# Patient Record
Sex: Male | Born: 1965 | Race: White | Hispanic: No | Marital: Single | Smoking: Current every day smoker
Health system: Southern US, Community
[De-identification: ages and names within clinical notes are randomized; demographics above are authoritative.]

## PROBLEM LIST (undated history)

## (undated) DIAGNOSIS — G4730 Sleep apnea, unspecified: Secondary | ICD-10-CM

## (undated) DIAGNOSIS — R7303 Prediabetes: Secondary | ICD-10-CM

## (undated) DIAGNOSIS — K219 Gastro-esophageal reflux disease without esophagitis: Secondary | ICD-10-CM

## (undated) DIAGNOSIS — R0600 Dyspnea, unspecified: Secondary | ICD-10-CM

## (undated) DIAGNOSIS — G709 Myoneural disorder, unspecified: Secondary | ICD-10-CM

## (undated) DIAGNOSIS — I471 Supraventricular tachycardia, unspecified: Secondary | ICD-10-CM

## (undated) DIAGNOSIS — J45909 Unspecified asthma, uncomplicated: Secondary | ICD-10-CM

## (undated) DIAGNOSIS — M1990 Unspecified osteoarthritis, unspecified site: Secondary | ICD-10-CM

## (undated) DIAGNOSIS — F419 Anxiety disorder, unspecified: Secondary | ICD-10-CM

## (undated) DIAGNOSIS — I10 Essential (primary) hypertension: Secondary | ICD-10-CM

## (undated) DIAGNOSIS — F32A Depression, unspecified: Secondary | ICD-10-CM

## (undated) HISTORY — DX: Prediabetes: R73.03

## (undated) HISTORY — DX: Essential (primary) hypertension: I10

## (undated) HISTORY — DX: Unspecified asthma, uncomplicated: J45.909

## (undated) HISTORY — DX: Sleep apnea, unspecified: G47.30

## (undated) HISTORY — DX: Anxiety disorder, unspecified: F41.9

## (undated) HISTORY — DX: Unspecified osteoarthritis, unspecified site: M19.90

## (undated) HISTORY — PX: MANDIBLE FRACTURE SURGERY: SHX706

## (undated) HISTORY — DX: Gastro-esophageal reflux disease without esophagitis: K21.9

## (undated) HISTORY — DX: Depression, unspecified: F32.A

---

## 1998-10-14 ENCOUNTER — Emergency Department (HOSPITAL_COMMUNITY): Admission: EM | Admit: 1998-10-14 | Discharge: 1998-10-14 | Payer: Self-pay | Admitting: *Deleted

## 1999-01-23 ENCOUNTER — Encounter: Payer: Self-pay | Admitting: Emergency Medicine

## 1999-01-24 ENCOUNTER — Inpatient Hospital Stay (HOSPITAL_COMMUNITY): Admission: EM | Admit: 1999-01-24 | Discharge: 1999-01-24 | Payer: Self-pay | Admitting: Emergency Medicine

## 1999-01-24 ENCOUNTER — Encounter: Payer: Self-pay | Admitting: Emergency Medicine

## 1999-01-30 ENCOUNTER — Emergency Department (HOSPITAL_COMMUNITY): Admission: EM | Admit: 1999-01-30 | Discharge: 1999-01-30 | Payer: Self-pay | Admitting: *Deleted

## 1999-02-11 ENCOUNTER — Emergency Department (HOSPITAL_COMMUNITY): Admission: EM | Admit: 1999-02-11 | Discharge: 1999-02-11 | Payer: Self-pay | Admitting: Emergency Medicine

## 1999-03-07 ENCOUNTER — Emergency Department (HOSPITAL_COMMUNITY): Admission: EM | Admit: 1999-03-07 | Discharge: 1999-03-07 | Payer: Self-pay | Admitting: Emergency Medicine

## 2006-02-17 ENCOUNTER — Emergency Department: Payer: Self-pay | Admitting: Emergency Medicine

## 2006-03-19 ENCOUNTER — Emergency Department: Payer: Self-pay | Admitting: Internal Medicine

## 2006-06-06 ENCOUNTER — Emergency Department (HOSPITAL_COMMUNITY): Admission: EM | Admit: 2006-06-06 | Discharge: 2006-06-06 | Payer: Self-pay | Admitting: Emergency Medicine

## 2007-12-05 ENCOUNTER — Emergency Department: Payer: Self-pay | Admitting: Emergency Medicine

## 2007-12-07 ENCOUNTER — Emergency Department: Payer: Self-pay | Admitting: Emergency Medicine

## 2009-09-27 ENCOUNTER — Emergency Department: Payer: Self-pay | Admitting: Emergency Medicine

## 2013-07-12 ENCOUNTER — Emergency Department: Payer: Self-pay | Admitting: Emergency Medicine

## 2013-07-12 LAB — URINALYSIS, COMPLETE
Bacteria: NONE SEEN
Bilirubin,UR: NEGATIVE
Blood: NEGATIVE
Glucose,UR: NEGATIVE mg/dL (ref 0–75)
Ketone: NEGATIVE
Leukocyte Esterase: NEGATIVE
Nitrite: NEGATIVE
Ph: 5 (ref 4.5–8.0)
Protein: 30
RBC,UR: 8 /HPF (ref 0–5)
Specific Gravity: 1.029 (ref 1.003–1.030)
Squamous Epithelial: 1
WBC UR: 1 /HPF (ref 0–5)

## 2020-11-25 ENCOUNTER — Other Ambulatory Visit: Payer: Self-pay

## 2020-11-25 ENCOUNTER — Ambulatory Visit: Payer: Medicaid Other | Admitting: Gerontology

## 2020-11-25 ENCOUNTER — Encounter: Payer: Self-pay | Admitting: Gerontology

## 2020-11-25 ENCOUNTER — Telehealth: Payer: Self-pay | Admitting: *Deleted

## 2020-11-25 VITALS — BP 145/96 | HR 103 | Temp 97.2°F | Resp 16 | Wt 203.6 lb

## 2020-11-25 DIAGNOSIS — Z7689 Persons encountering health services in other specified circumstances: Secondary | ICD-10-CM

## 2020-11-25 DIAGNOSIS — Z87891 Personal history of nicotine dependence: Secondary | ICD-10-CM

## 2020-11-25 DIAGNOSIS — F17200 Nicotine dependence, unspecified, uncomplicated: Secondary | ICD-10-CM

## 2020-11-25 DIAGNOSIS — F17211 Nicotine dependence, cigarettes, in remission: Secondary | ICD-10-CM | POA: Insufficient documentation

## 2020-11-25 DIAGNOSIS — J4540 Moderate persistent asthma, uncomplicated: Secondary | ICD-10-CM | POA: Insufficient documentation

## 2020-11-25 DIAGNOSIS — I152 Hypertension secondary to endocrine disorders: Secondary | ICD-10-CM | POA: Insufficient documentation

## 2020-11-25 DIAGNOSIS — Z8709 Personal history of other diseases of the respiratory system: Secondary | ICD-10-CM

## 2020-11-25 DIAGNOSIS — IMO0001 Reserved for inherently not codable concepts without codable children: Secondary | ICD-10-CM

## 2020-11-25 DIAGNOSIS — Z122 Encounter for screening for malignant neoplasm of respiratory organs: Secondary | ICD-10-CM

## 2020-11-25 DIAGNOSIS — F419 Anxiety disorder, unspecified: Secondary | ICD-10-CM

## 2020-11-25 DIAGNOSIS — Z87898 Personal history of other specified conditions: Secondary | ICD-10-CM | POA: Insufficient documentation

## 2020-11-25 DIAGNOSIS — Z8719 Personal history of other diseases of the digestive system: Secondary | ICD-10-CM

## 2020-11-25 DIAGNOSIS — F32A Depression, unspecified: Secondary | ICD-10-CM

## 2020-11-25 DIAGNOSIS — I10 Essential (primary) hypertension: Secondary | ICD-10-CM

## 2020-11-25 HISTORY — DX: Persons encountering health services in other specified circumstances: Z76.89

## 2020-11-25 MED ORDER — OMEPRAZOLE 20 MG PO CPDR
20.0000 mg | DELAYED_RELEASE_CAPSULE | Freq: Every day | ORAL | 0 refills | Status: DC
  Filled 2020-11-25: qty 30, 30d supply, fill #0

## 2020-11-25 MED ORDER — LOSARTAN POTASSIUM 25 MG PO TABS
25.0000 mg | ORAL_TABLET | Freq: Every day | ORAL | 0 refills | Status: DC
  Filled 2020-11-25: qty 30, 30d supply, fill #0

## 2020-11-25 MED ORDER — FLUTICASONE-SALMETEROL 250-50 MCG/DOSE IN AEPB
1.0000 | INHALATION_SPRAY | Freq: Every day | RESPIRATORY_TRACT | 2 refills | Status: DC
  Filled 2020-11-25: qty 60, 30d supply, fill #0

## 2020-11-25 MED ORDER — MELATONIN 3 MG PO CAPS
3.0000 mg | ORAL_CAPSULE | Freq: Every day | ORAL | 0 refills | Status: DC
Start: 2020-11-25 — End: 2020-12-17
  Filled 2020-11-25: qty 30, 30d supply, fill #0

## 2020-11-25 MED ORDER — ALBUTEROL SULFATE HFA 108 (90 BASE) MCG/ACT IN AERS
2.0000 | INHALATION_SPRAY | Freq: Four times a day (QID) | RESPIRATORY_TRACT | 2 refills | Status: DC | PRN
  Filled 2020-11-25: qty 6.7, 25d supply, fill #0

## 2020-11-25 NOTE — Telephone Encounter (Signed)
Received referral for initial lung cancer screening scan. Contacted patient and obtained smoking history,(current, 51 pack year history) as well as answering questions related to screening process. Patient denies signs of lung cancer such as weight loss or hemoptysis. Patient denies comorbidity that would prevent curative treatment if lung cancer were found. Patient is scheduled for shared decision making visit and CT scan on 12/14/20 at 1pm.

## 2020-11-25 NOTE — Patient Instructions (Addendum)
Food Choices for Gastroesophageal Reflux Disease, Adult When you have gastroesophageal reflux disease (GERD), the foods you eat and your eating habits are very important. Choosing the right foods can help ease your discomfort. Think about working with a food expert (dietitian) to help you make good choices. What are tips for following this plan? Reading food labels  Look for foods that are low in saturated fat. Foods that may help with your symptoms include: ? Foods that have less than 5% of daily value (DV) of fat. ? Foods that have 0 grams of trans fat. Cooking  Do not fry your food.  Cook your food by baking, steaming, grilling, or broiling. These are all methods that do not need a lot of fat for cooking.  To add flavor, try to use herbs that are low in spice and acidity. Meal planning  Choose healthy foods that are low in fat, such as: ? Fruits and vegetables. ? Whole grains. ? Low-fat dairy products. ? Lean meats, fish, and poultry.  Eat small meals often instead of eating 3 large meals each day. Eat your meals slowly in a place where you are relaxed. Avoid bending over or lying down until 2-3 hours after eating.  Limit high-fat foods such as fatty meats or fried foods.  Limit your intake of fatty foods, such as oils, butter, and shortening.  Avoid the following as told by your doctor: ? Foods that cause symptoms. These may be different for different people. Keep a food diary to keep track of foods that cause symptoms. ? Alcohol. ? Drinking a lot of liquid with meals. ? Eating meals during the 2-3 hours before bed.   Lifestyle  Stay at a healthy weight. Ask your doctor what weight is healthy for you. If you need to lose weight, work with your doctor to do so safely.  Exercise for at least 30 minutes on 5 or more days each week, or as told by your doctor.  Wear loose-fitting clothes.  Do not smoke or use any products that contain nicotine or tobacco. If you need help  quitting, ask your doctor.  Sleep with the head of your bed higher than your feet. Use a wedge under the mattress or blocks under the bed frame to raise the head of the bed.  Chew sugar-free gum after meals. What foods should eat? Eat a healthy, well-balanced diet of fruits, vegetables, whole grains, low-fat dairy products, lean meats, fish, and poultry. Each person is different. Foods that may cause symptoms in one person may not cause any symptoms in another person. Work with your doctor to find foods that are safe for you. The items listed above may not be a complete list of what you can eat and drink. Contact a food expert for more options.   What foods should I avoid? Limiting some of these foods may help in managing the symptoms of GERD. Everyone is different. Talk with a food expert or your doctor to help you find the exact foods to avoid, if any. Fruits Any fruits prepared with added fat. Any fruits that cause symptoms. For some people, this may include citrus fruits, such as oranges, grapefruit, pineapple, and lemons. Vegetables Deep-fried vegetables. French fries. Any vegetables prepared with added fat. Any vegetables that cause symptoms. For some people, this may include tomatoes and tomato products, chili peppers, onions and garlic, and horseradish. Grains Pastries or quick breads with added fat. Meats and other proteins High-fat meats, such as fatty beef or pork,   hot dogs, ribs, ham, sausage, salami, and bacon. Fried meat or protein, including fried fish and fried chicken. Nuts and nut butters, in large amounts. Dairy Whole milk and chocolate milk. Sour cream. Cream. Ice cream. Cream cheese. Milkshakes. Fats and oils Butter. Margarine. Shortening. Ghee. Beverages Coffee and tea, with or without caffeine. Carbonated beverages. Sodas. Energy drinks. Fruit juice made with acidic fruits, such as orange or grapefruit. Tomato juice. Alcoholic drinks. Sweets and desserts Chocolate and  cocoa. Donuts. Seasonings and condiments Pepper. Peppermint and spearmint. Added salt. Any condiments, herbs, or seasonings that cause symptoms. For some people, this may include curry, hot sauce, or vinegar-based salad dressings. The items listed above may not be a complete list of what you should not eat and drink. Contact a food expert for more options. Questions to ask your doctor Diet and lifestyle changes are often the first steps that are taken to manage symptoms of GERD. If diet and lifestyle changes do not help, talk with your doctor about taking medicines. Where to find more information  International Foundation for Gastrointestinal Disorders: aboutgerd.org Summary  When you have GERD, food and lifestyle choices are very important in easing your symptoms.  Eat small meals often instead of 3 large meals a day. Eat your meals slowly and in a place where you are relaxed.  Avoid bending over or lying down until 2-3 hours after eating.  Limit high-fat foods such as fatty meats or fried foods. This information is not intended to replace advice given to you by your health care provider. Make sure you discuss any questions you have with your health care provider. Document Revised: 02/10/2020 Document Reviewed: 02/10/2020 Elsevier Patient Education  2021 [place]. Smoking Tobacco Information, Adult Smoking tobacco can be harmful to your health. Tobacco contains a poisonous (toxic), colorless chemical called nicotine. Nicotine is addictive. It changes the brain and can make it hard to stop smoking. Tobacco also has other toxic chemicals that can hurt your body and raise your risk of many cancers. How can smoking tobacco affect me? Smoking tobacco puts you at risk for:  Cancer. Smoking is most commonly associated with lung cancer, but can also lead to cancer in other parts of the body.  Chronic obstructive pulmonary disease (COPD). This is a long-term lung condition that makes it hard  to breathe. It also gets worse over time.  High blood pressure (hypertension), heart disease, stroke, or heart attack.  Lung infections, such as pneumonia.  Cataracts. This is when the lenses in the eyes become clouded.  Digestive problems. This may include peptic ulcers, heartburn, and gastroesophageal reflux disease (GERD).  Oral health problems, such as gum disease and tooth loss.  Loss of taste and smell. Smoking can affect your appearance by causing:  Wrinkles.  Yellow or stained teeth, fingers, and fingernails. Smoking tobacco can also affect your social life, because:  It may be challenging to find places to smoke when away from home. Many workplaces, [employer], hotels, and public places are tobacco-free.  Smoking is expensive. This is due to the cost of tobacco and the long-term costs of treating health problems from smoking.  Secondhand smoke may affect those around you. Secondhand smoke can cause lung cancer, breathing problems, and heart disease. Children of smokers have a higher risk for: ? Sudden infant death syndrome (SIDS). ? Ear infections. ? Lung infections. If you currently smoke tobacco, quitting now can help you:  Lead a longer and healthier life.  Look, smell, breathe, and feel better  over time.  Save money.  Protect others from the harms of secondhand smoke. What actions can I take to prevent health problems? Quit smoking  Do not start smoking. Quit if you already do.  Make a plan to quit smoking and commit to it. Look for programs to help you and ask your health care provider for recommendations and ideas.  Set a date and write down all the reasons you want to quit.  Let your friends and family know you are quitting so they can help and support you. Consider finding friends who also want to quit. It can be easier to quit with someone else, so that you can support each other.  Talk with your health care provider about using nicotine replacement  medicines to help you quit, such as gum, lozenges, patches, sprays, or pills.  Do not replace cigarette smoking with electronic cigarettes, which are commonly called e-cigarettes. The safety of e-cigarettes is not known, and some may contain harmful chemicals.  If you try to quit but return to smoking, stay positive. It is common to slip up when you first quit, so take it one day at a time.  Be prepared for cravings. When you feel the urge to smoke, chew gum or suck on hard candy.   Lifestyle  Stay busy and take care of your body.  Drink enough fluid to keep your urine pale yellow.  Get plenty of exercise and eat a healthy diet. This can help prevent weight gain after quitting.  Monitor your eating habits. Quitting smoking can cause you to have a larger appetite than when you smoke.  Find ways to relax. Go out with friends or family to a movie or a restaurant where people do not smoke.  Ask your health care provider about having regular tests (screenings) to check for cancer. This may include blood tests, imaging tests, and other tests.  Find ways to manage your stress, such as meditation, yoga, or exercise. Where to find support To get support to quit smoking, consider:  Asking your health care provider for more information and resources.  Taking classes to learn more about quitting smoking.  Looking for local organizations that offer resources about quitting smoking.  Joining a support group for people who want to quit smoking in your local community.  Calling the smokefree.gov counselor helpline: 1-800-Quit-Now (443) 503-0430) Where to find more information You may find more information about quitting smoking from:  HelpGuide.org: www.helpguide.org  https://hall.com/: smokefree.gov  American Lung Association: www.lung.org Contact a health care provider if you:  Have problems breathing.  Notice that your lips, nose, or fingers turn blue.  Have chest pain.  Are  coughing up blood.  Feel faint or you pass out.  Have other health changes that cause you to worry. Summary  Smoking tobacco can negatively affect your health, the health of those around you, your finances, and your social life.  Do not start smoking. Quit if you already do. If you need help quitting, ask your health care provider.  Think about joining a support group for people who want to quit smoking in your local community. There are many effective programs that will help you to quit this behavior. This information is not intended to replace advice given to you by your health care provider. Make sure you discuss any questions you have with your health care provider. Document Revised: 04/26/2019 Document Reviewed: 08/16/2016 Elsevier Patient Education  2021 [place].  [email or web address]">  DASH Eating Plan DASH stands for Dietary  Approaches to Stop Hypertension. The DASH eating plan is a healthy eating plan that has been shown to:  Reduce high blood pressure (hypertension).  Reduce your risk for type 2 diabetes, heart disease, and stroke.  Help with weight loss. What are tips for following this plan? Reading food labels  Check food labels for the amount of salt (sodium) per serving. Choose foods with less than 5 percent of the Daily Value of sodium. Generally, foods with less than 300 milligrams (mg) of sodium per serving fit into this eating plan.  To find whole grains, look for the word "whole" as the first word in the ingredient list. Shopping  Buy products labeled as "low-sodium" or "no salt added."  Buy fresh foods. Avoid canned foods and pre-made or frozen meals. Cooking  Avoid adding salt when cooking. Use salt-free seasonings or herbs instead of table salt or sea salt. Check with your health care provider or pharmacist before using salt substitutes.  Do not fry foods. Cook foods using healthy methods such as baking,  boiling, grilling, roasting, and broiling instead.  Cook with heart-healthy oils, such as olive, canola, avocado, soybean, or sunflower oil. Meal planning  Eat a balanced diet that includes: ? 4 or more servings of fruits and 4 or more servings of vegetables each day. Try to fill one-half of your plate with fruits and vegetables. ? 6-8 servings of whole grains each day. ? Less than 6 oz (170 g) of lean meat, poultry, or fish each day. A 3-oz (85-g) serving of meat is about the same size as a deck of cards. One egg equals 1 oz (28 g). ? 2-3 servings of low-fat dairy each day. One serving is 1 cup (237 mL). ? 1 serving of nuts, seeds, or beans 5 times each week. ? 2-3 servings of heart-healthy fats. Healthy fats called omega-3 fatty acids are found in foods such as walnuts, flaxseeds, fortified milks, and eggs. These fats are also found in cold-water fish, such as sardines, salmon, and mackerel.  Limit how much you eat of: ? Canned or prepackaged foods. ? Food that is high in trans fat, such as some fried foods. ? Food that is high in saturated fat, such as fatty meat. ? Desserts and other sweets, sugary drinks, and other foods with added sugar. ? Full-fat dairy products.  Do not salt foods before eating.  Do not eat more than 4 egg yolks a week.  Try to eat at least 2 vegetarian meals a week.  Eat more home-cooked food and less restaurant, buffet, and fast food.   Lifestyle  When eating at a restaurant, ask that your food be prepared with less salt or no salt, if possible.  If you drink alcohol: ? Limit how much you use to:  0-1 drink a day for women who are not pregnant.  0-2 drinks a day for men. ? Be aware of how much alcohol is in your drink. In the [place] one drink equals one 12 oz bottle of beer (355 mL), one 5 oz glass of wine (148 mL), or one 1 oz glass of hard liquor (44 mL). General information  Avoid eating more than 2,300 mg of salt a day. If you have hypertension,  you may need to reduce your sodium intake to 1,500 mg a day.  Work with your health care provider to maintain a healthy body weight or to lose weight. Ask what an ideal weight is for you.  Get at least 30 minutes of  exercise that causes your heart to beat faster (aerobic exercise) most days of the week. Activities may include walking, swimming, or biking.  Work with your health care provider or dietitian to adjust your eating plan to your individual calorie needs. What foods should I eat? Fruits All fresh, dried, or frozen fruit. Canned fruit in natural juice (without added sugar). Vegetables Fresh or frozen vegetables (raw, steamed, roasted, or grilled). Low-sodium or reduced-sodium tomato and vegetable juice. Low-sodium or reduced-sodium tomato sauce and tomato paste. Low-sodium or reduced-sodium canned vegetables. Grains Whole-grain or whole-wheat bread. Whole-grain or whole-wheat pasta. Brown rice. [place]. Bulgur. Whole-grain and low-sodium cereals. Pita bread. Low-fat, low-sodium crackers. Whole-wheat flour tortillas. Meats and other proteins Skinless chicken or [place]. Ground chicken or [place]. Pork with fat trimmed off. Fish and seafood. Egg whites. Dried beans, peas, or lentils. Unsalted nuts, nut butters, and seeds. Unsalted canned beans. Lean cuts of beef with fat trimmed off. Low-sodium, lean precooked or cured meat, such as sausages or meat loaves. Dairy Low-fat (1%) or fat-free (skim) milk. Reduced-fat, low-fat, or fat-free cheeses. Nonfat, low-sodium ricotta or cottage cheese. Low-fat or nonfat yogurt. Low-fat, low-sodium cheese. Fats and oils Soft margarine without trans fats. Vegetable oil. Reduced-fat, low-fat, or light mayonnaise and salad dressings (reduced-sodium). Canola, safflower, olive, avocado, soybean, and sunflower oils. Avocado. Seasonings and condiments Herbs. Spices. Seasoning mixes without salt. Other foods Unsalted popcorn and pretzels. Fat-free  sweets. The items listed above may not be a complete list of foods and beverages you can eat. Contact a dietitian for more information. What foods should I avoid? Fruits Canned fruit in a light or heavy syrup. Fried fruit. Fruit in cream or butter sauce. Vegetables Creamed or fried vegetables. Vegetables in a cheese sauce. Regular canned vegetables (not low-sodium or reduced-sodium). Regular canned tomato sauce and paste (not low-sodium or reduced-sodium). Regular tomato and vegetable juice (not low-sodium or reduced-sodium). Angie Fava. Olives. Grains Baked goods made with fat, such as croissants, muffins, or some breads. Dry pasta or rice meal packs. Meats and other proteins Fatty cuts of meat. Ribs. Fried meat. Berniece Salines. Bologna, salami, and other precooked or cured meats, such as sausages or meat loaves. Fat from the back of a pig (fatback). Bratwurst. Salted nuts and seeds. Canned beans with added salt. Canned or smoked fish. Whole eggs or egg yolks. Chicken or [place] with skin. Dairy Whole or 2% milk, cream, and half-and-half. Whole or full-fat cream cheese. Whole-fat or sweetened yogurt. Full-fat cheese. Nondairy creamers. Whipped toppings. Processed cheese and cheese spreads. Fats and oils Butter. Stick margarine. Lard. Shortening. Ghee. Bacon fat. Tropical oils, such as coconut, palm kernel, or palm oil. Seasonings and condiments Onion salt, garlic salt, seasoned salt, table salt, and sea salt. Worcestershire sauce. Tartar sauce. Barbecue sauce. Teriyaki sauce. Soy sauce, including reduced-sodium. Steak sauce. Canned and packaged gravies. Fish sauce. Oyster sauce. Cocktail sauce. Store-bought horseradish. Ketchup. Mustard. Meat flavorings and tenderizers. Bouillon cubes. Hot sauces. Pre-made or packaged marinades. Pre-made or packaged taco seasonings. Relishes. Regular salad dressings. Other foods Salted popcorn and pretzels. The items listed above may not be a complete list of foods and  beverages you should avoid. Contact a dietitian for more information. Where to find more information  National Heart, Lung, and Blood Institute: https://wilson-eaton.com/  American Heart Association: www.heart.org  Academy of Nutrition and Dietetics: www.eatright.[place]: www.kidney.org Summary  The DASH eating plan is a healthy eating plan that has been shown to reduce high blood pressure (hypertension). It may also  reduce your risk for type 2 diabetes, heart disease, and stroke.  When on the DASH eating plan, aim to eat more fresh fruits and vegetables, whole grains, lean proteins, low-fat dairy, and heart-healthy fats.  With the DASH eating plan, you should limit salt (sodium) intake to 2,300 mg a day. If you have hypertension, you may need to reduce your sodium intake to 1,500 mg a day.  Work with your health care provider or dietitian to adjust your eating plan to your individual calorie needs. This information is not intended to replace advice given to you by your health care provider. Make sure you discuss any questions you have with your health care provider. Document Revised: 07/05/2019 Document Reviewed: 07/05/2019 Elsevier Patient Education  2021 [employer].

## 2020-11-25 NOTE — Progress Notes (Signed)
New Patient Office Visit  Subjective:  Patient ID: Jason Livingston, male    DOB: 1966-02-16  Age: 55 y.o. MRN: [id number]  CC: Encounter to establish care  HPI Jason Livingston 55 y/o male who presents to establish care. He report having a history of Asthma, states that his breathing is stable. He admits to experiencing intermittent shortness of breath with exertion, intermittent productive cough with minimal amount of clear phlegm, occasional chest tightness, but denies wheezing. He smokes 1 pack of cigarette daily and admits the desire to quit. He also has a history of acid reflux and takes Tums as needed with some relief. He reports history of depression and anxiety, states that his mood is good, denies suicidal nor homicidal ideation and request a mental health evaluation. He also c/o experiencing difficulty falling asleep that has been going on for many years. His blood pressure was elevated, he denies headache, vision changes and chest pain. He also admits to experiencing peripheral neuropathy and performs daily foot check. Overall, he states that he's doing well and offers no further complaint.    No family history on file.  Social History   Socioeconomic History  . Marital status: Married    Spouse name: Not on file  . Number of children: Not on file  . Years of education: Not on file  . Highest education level: Not on file  Occupational History  . Not on file  Tobacco Use  . Smoking status: Current Every Day Smoker    Packs/day: 1.50    Years: 34.00    Pack years: 51.00    Types: Cigarettes  . Smokeless tobacco: Not on file  Vaping Use  . Vaping Use: Never used  Substance and Sexual Activity  . Alcohol use: Yes    Alcohol/week: 2.0 standard drinks    Types: 2 Cans of beer per week    Comment: every other day  . Drug use: Not Currently  . Sexual activity: Not Currently  Other Topics Concern  . Not on file  Social History Narrative  . Not on file   Social  Determinants of Health   Financial Resource Strain: Not on file  Food Insecurity: Not on file  Transportation Needs: Not on file  Physical Activity: Not on file  Stress: Not on file  Social Connections: Not on file  Intimate Partner Violence: Not on file    ROS Review of Systems  Constitutional: Negative.   HENT: Negative.   Eyes: Negative.   Respiratory: Positive for chest tightness and shortness of breath.   Cardiovascular: Negative.   Gastrointestinal: Negative.   Endocrine: Negative.   Genitourinary: Negative.   Musculoskeletal: Negative.   Skin: Negative.   Neurological: Negative.   Hematological: Negative.   Psychiatric/Behavioral: Positive for sleep disturbance. The patient is nervous/anxious.     Objective:   Today's Vitals: BP (!) 145/96 (BP Location: Left Arm, Patient Position: Sitting, Cuff Size: Large)   Pulse (!) 103   Temp (!) 97.2 F (36.2 C)   Resp 16   Wt 203 lb 9.6 oz (92.4 kg)   SpO2 94%   Physical Exam Constitutional:      Appearance: Normal appearance.  HENT:     Head: Normocephalic and atraumatic.     Nose: Nose normal.     Mouth/Throat:     Mouth: Mucous membranes are moist.  Eyes:     Extraocular Movements: Extraocular movements intact.     Conjunctiva/sclera: Conjunctivae normal.     Pupils: Pupils  are equal, round, and reactive to light.  Cardiovascular:     Rate and Rhythm: Normal rate and regular rhythm.     Pulses: Normal pulses.     Heart sounds: Normal heart sounds.  Pulmonary:     Effort: Pulmonary effort is normal.     Breath sounds: Normal breath sounds.  Abdominal:     General: Abdomen is flat. Bowel sounds are normal.     Palpations: Abdomen is soft.  Genitourinary:    Comments: Deferred per patient Musculoskeletal:        General: Normal range of motion.     Cervical back: Normal range of motion.  Skin:    General: Skin is warm.  Neurological:     General: No focal deficit present.     Mental Status: He is alert  and oriented to person, place, and time. Mental status is at baseline.  Psychiatric:        Mood and Affect: Mood normal.        Behavior: Behavior normal.        Thought Content: Thought content normal.        Judgment: Judgment normal.     Assessment & Plan:    1. Encounter to establish care -Routine labs will be checked - CBC w/Diff; Future - Comp Met (CMET); Future - Lipid panel; Future - HgB A1c; Future - Urinalysis; Future - Ambulatory referral to Gastroenterology - Urinalysis - HgB A1c - Lipid panel - Comp Met (CMET) - CBC w/Diff  2. Essential hypertension - His blood pressure was elevated, he will start on 25 mg Losartan, was educated on medication side effects, and to notify clinic, continue on DASH diet and exercise as tolerated. - losartan (COZAAR) 25 MG tablet; Take 1 tablet (25 mg total) by mouth daily.  Dispense: 30 tablet; Refill: 0  3. History of asthma - His breathing is stable, he will continue on Albuterol as needed Advair daily. He was educated on medication side effects and to notify clinic - Fluticasone-Salmeterol (ADVAIR) 250-50 MCG/DOSE AEPB; Inhale 1 puff into the lungs 2 times a day.  Dispense: 60 each; Refill: 2 - albuterol (VENTOLIN HFA) 108 (90 Base) MCG/ACT inhaler; Inhale 2 puffs into the lungs every 6 (six) hours as needed for wheezing or shortness of breath.  Dispense: 6.7 g; Refill: 2  4. Anxiety and depression - He will schedule an appointment with [facility] Ms. Pruitt and was provided with [facility] information, also to call Crisis Help line for worsening symptoms.  5. History of insomnia - He was educated on sleep hygiene, advised to take Melatonin 3 mg and follow up with [facility] Ms. Pruitt. - Melatonin 3 MG CAPS; Take 1 capsule (3 mg total) by mouth at bedtime.  Dispense: 30 capsule; Refill: 0  6. History of gastroesophageal reflux (GERD) - He will continue on Omeprazole, educated on medication side effects and to  notify the clinic. -Avoid spicy, fatty and fried food -Avoid sodas and sour juices -Avoid heavy meals -Avoid eating 4 hours before bedtime -Elevate head of bed at night - omeprazole (PRILOSEC) 20 MG capsule; Take 1 capsule (20 mg total) by mouth daily.  Dispense: 30 capsule; Refill: 0 - Ambulatory referral to Gastroenterology  7. Smoking - He was encouraged on smoking cessation, provided with  Quit line information.    Follow-up: Return in about 3 weeks (around 12/16/2020), or if symptoms worsen or fail to improve.   Jason Livingston Jerold Coombe, NP

## 2020-11-26 ENCOUNTER — Other Ambulatory Visit: Payer: Self-pay

## 2020-11-26 LAB — URINALYSIS
Bilirubin, UA: NEGATIVE
Glucose, UA: NEGATIVE
Leukocytes,UA: NEGATIVE
Nitrite, UA: NEGATIVE
Protein,UA: NEGATIVE
RBC, UA: NEGATIVE
Specific Gravity, UA: 1.024 (ref 1.005–1.030)
Urobilinogen, Ur: 1 mg/dL (ref 0.2–1.0)
pH, UA: 5 (ref 5.0–7.5)

## 2020-11-26 LAB — COMPREHENSIVE METABOLIC PANEL
ALT: 15 IU/L (ref 0–44)
AST: 15 IU/L (ref 0–40)
Albumin/Globulin Ratio: 1.7 (ref 1.2–2.2)
Albumin: 4.5 g/dL (ref 3.8–4.9)
Alkaline Phosphatase: 70 IU/L (ref 44–121)
BUN/Creatinine Ratio: 14 (ref 9–20)
BUN: 14 mg/dL (ref 6–24)
Bilirubin Total: 0.2 mg/dL (ref 0.0–1.2)
CO2: 22 mmol/L (ref 20–29)
Calcium: 9.3 mg/dL (ref 8.7–10.2)
Chloride: 105 mmol/L (ref 96–106)
Creatinine, Ser: 1 mg/dL (ref 0.76–1.27)
Globulin, Total: 2.6 g/dL (ref 1.5–4.5)
Glucose: 111 mg/dL — ABNORMAL HIGH (ref 65–99)
Potassium: 4.8 mmol/L (ref 3.5–5.2)
Sodium: 144 mmol/L (ref 134–144)
Total Protein: 7.1 g/dL (ref 6.0–8.5)
eGFR: 91 mL/min/{1.73_m2} (ref >59)

## 2020-11-26 LAB — CBC WITH DIFFERENTIAL/PLATELET
Basophils Absolute: 0.1 10*3/uL (ref 0.0–0.2)
Basos: 1 %
EOS (ABSOLUTE): 0.2 10*3/uL (ref 0.0–0.4)
Eos: 3 %
Hematocrit: 44.8 % (ref 37.5–51.0)
Hemoglobin: 15.7 g/dL (ref 13.0–17.7)
Immature Grans (Abs): 0 10*3/uL (ref 0.0–0.1)
Immature Granulocytes: 0 %
Lymphocytes Absolute: 2 10*3/uL (ref 0.7–3.1)
Lymphs: 33 %
MCH: 33.6 pg — ABNORMAL HIGH (ref 26.6–33.0)
MCHC: 35 g/dL (ref 31.5–35.7)
MCV: 96 fL (ref 79–97)
Monocytes Absolute: 0.3 10*3/uL (ref 0.1–0.9)
Monocytes: 6 %
Neutrophils Absolute: 3.6 10*3/uL (ref 1.4–7.0)
Neutrophils: 57 %
Platelets: 258 10*3/uL (ref 150–450)
RBC: 4.67 x10E6/uL (ref 4.14–5.80)
RDW: 13.4 % (ref 11.6–15.4)
WBC: 6.2 10*3/uL (ref 3.4–10.8)

## 2020-11-26 LAB — LIPID PANEL
Chol/HDL Ratio: 4.7 ratio (ref 0.0–5.0)
Cholesterol, Total: 232 mg/dL — ABNORMAL HIGH (ref 100–199)
HDL: 49 mg/dL (ref >39)
LDL Chol Calc (NIH): 141 mg/dL — ABNORMAL HIGH (ref 0–99)
Triglycerides: 234 mg/dL — ABNORMAL HIGH (ref 0–149)
VLDL Cholesterol Cal: 42 mg/dL — ABNORMAL HIGH (ref 5–40)

## 2020-11-26 LAB — HEMOGLOBIN A1C
Est. average glucose Bld gHb Est-mCnc: 131 mg/dL
Hgb A1c MFr Bld: 6.2 % — ABNORMAL HIGH (ref 4.8–5.6)

## 2020-11-30 ENCOUNTER — Other Ambulatory Visit: Payer: Self-pay

## 2020-12-01 ENCOUNTER — Other Ambulatory Visit: Payer: Self-pay

## 2020-12-01 ENCOUNTER — Other Ambulatory Visit: Payer: Self-pay | Admitting: Gerontology

## 2020-12-01 DIAGNOSIS — Z8709 Personal history of other diseases of the respiratory system: Secondary | ICD-10-CM

## 2020-12-01 MED ORDER — BREO ELLIPTA 100-25 MCG/INH IN AEPB
1.0000 | INHALATION_SPRAY | Freq: Every day | RESPIRATORY_TRACT | 5 refills | Status: DC
  Filled 2020-12-01: qty 1, 1d supply, fill #0
  Filled 2020-12-31: qty 1, 30d supply, fill #1

## 2020-12-01 NOTE — Congregational Nurse Program (Signed)
  Dept: [place] Nurse Program Note  Date of Encounter: 12/01/2020 Client in for vital signs check. Reports still having difficulty sleeping, has tried the melatonin once. Encouraged him to try to take before bed for a few nights in a row, also discussed a sleep routine. RN has followed up regarding Advair prescription which was not available last week. Awaiting response from [facility] clinician regarding another option vs using the ventolin inhaler differently. Client voiced understanding. Client appreciative of assistance provided.  Past Medical History: Past Medical History:  Diagnosis Date  . Anxiety   . Asthma   . Depression   . GERD (gastroesophageal reflux disease)   . Hypertension   . Sleep apnea     Encounter Details:  CNP Questionnaire - 12/01/20 1000      Questionnaire   Do you give verbal consent to treat you today? Yes    Visit Setting Church or Organization    Location Patient Served At [facility]    Patient Status Not Applicable    Medical Provider Yes   [facility]   Insurance Uninsured (Includes Orange Card/Care [place])    Intervention Advocate;Assess (including screenings);Support    Housing/Utilities No permanent housing   lives in a boarding house   Transportation Need transportation assistance

## 2020-12-03 NOTE — Congregational Nurse Program (Signed)
  Dept: [place] Nurse Program Note  Date of Encounter: 12/03/2020  Client in for blood pressure check. Also given his Breo, administration instructions provided. Discussed other medications including his PPI, encouraged to take in the morning with water, not coffee. Client voiced understanding. He does plan to return to clinic next week.   Past Medical History: Past Medical History:  Diagnosis Date  . Anxiety   . Asthma   . Depression   . GERD (gastroesophageal reflux disease)   . Hypertension   . Sleep apnea     Encounter Details:  CNP Questionnaire - 12/03/20 1243      Questionnaire   Do you give verbal consent to treat you today? Yes    Visit Setting Church or Organization    Location Patient Served At [facility]    Patient Status Not Applicable    Medical Provider Yes   [facility]   Insurance Uninsured (Includes Orange Card/Care [place])    Intervention Advocate;Assess (including screenings);Support    Housing/Utilities No permanent housing   lives in a boarding house   Transportation Need transportation assistance

## 2020-12-10 ENCOUNTER — Encounter: Payer: Self-pay | Admitting: Nurse Practitioner

## 2020-12-14 ENCOUNTER — Other Ambulatory Visit: Payer: Self-pay

## 2020-12-14 ENCOUNTER — Inpatient Hospital Stay: Payer: Medicaid Other | Attending: Oncology | Admitting: Nurse Practitioner

## 2020-12-14 ENCOUNTER — Ambulatory Visit
Admission: RE | Admit: 2020-12-14 | Discharge: 2020-12-14 | Disposition: A | Discharge status: Home / Self Care | Payer: Self-pay | Source: Ambulatory Visit | Attending: Oncology | Admitting: Oncology

## 2020-12-14 DIAGNOSIS — F17210 Nicotine dependence, cigarettes, uncomplicated: Secondary | ICD-10-CM

## 2020-12-14 DIAGNOSIS — Z122 Encounter for screening for malignant neoplasm of respiratory organs: Secondary | ICD-10-CM | POA: Insufficient documentation

## 2020-12-14 DIAGNOSIS — Z87891 Personal history of nicotine dependence: Secondary | ICD-10-CM

## 2020-12-14 DIAGNOSIS — F17200 Nicotine dependence, unspecified, uncomplicated: Secondary | ICD-10-CM | POA: Insufficient documentation

## 2020-12-14 NOTE — Progress Notes (Signed)
Virtual Visit via Video Enabled Telemedicine Note   I connected with [name] on 12/14/20 at 1:00 PM EST by video enabled telemedicine visit and verified that I am speaking with the correct person using two identifiers.   I discussed the limitations, risks, security and privacy concerns of performing an evaluation and management service by telemedicine and the availability of in-person appointments. I also discussed with the patient that there may be a patient responsible charge related to this service. The patient expressed understanding and agreed to proceed.   Other persons participating in the visit and their role in the encounter: Burgess Estelle, RN- checking in patient & navigation  Patient's location: [place]  Provider's location: Clinic  Chief Complaint: Low Dose CT Screening  Patient agreed to evaluation by telemedicine to discuss shared decision making for consideration of low dose CT lung cancer screening.    In accordance with CMS guidelines, patient has met eligibility criteria including age, absence of signs or symptoms of lung cancer.  Social History   Tobacco Use  . Smoking status: Current Every Day Smoker    Packs/day: 1.50    Years: 34.00    Pack years: 51.00    Types: Cigarettes  . Smokeless tobacco: Not on file  Substance Use Topics  . Alcohol use: Yes    Alcohol/week: 2.0 standard drinks    Types: 2 Cans of beer per week    Comment: every other day    A shared decision-making session was conducted prior to the performance of CT scan. This includes one or more decision aids, includes benefits and harms of screening, follow-up diagnostic testing, over-diagnosis, false positive rate, and total radiation exposure.  Counseling on the importance of adherence to annual lung cancer LDCT screening, impact of co-morbidities, and ability or willingness to undergo diagnosis and treatment is imperative for compliance of the program.   Counseling on the  importance of continued smoking cessation for former smokers; the importance of smoking cessation for current smokers, and information about tobacco cessation interventions have been given to patient including [place] and [facility] Quit [place] programs.   Written order for lung cancer screening with LDCT has been given to the patient and any and all questions have been answered to the best of my abilities.    Yearly follow up will be coordinated by Burgess Estelle, Thoracic Navigator.  I discussed the assessment and treatment plan with the patient. The patient was provided an opportunity to ask questions and all were answered. The patient agreed with the plan and demonstrated an understanding of the instructions.   The patient was advised to call back or seek an in-person evaluation if the symptoms worsen or if the condition fails to improve as anticipated.   I provided 15 minutes of face-to-face video visit time dedicated to the care of this patient on the date of this encounter to include pre-visit review of smoking history, face-to-face time with the patient, and post visit ordering of testing/documentation.   Beckey Rutter, DNP, AGNP-C [place] at [facility] [phone] (clinic)

## 2020-12-15 NOTE — Congregational Nurse Program (Signed)
  Dept: [place] Nurse Program Note  Date of Encounter: 12/15/2020 Client In for blood pressure check and also to discuss his my chart lab results. Questions answered. He reports he did go for his chest CT yesterday 5/2. Results not reviewed. He has a follow up appointment at the [facility] on Thursday 5/5. Reviewed questions he has for the NP. Support given. Will continue to follow at [facility] clinic.  Past Medical History: Past Medical History:  Diagnosis Date  . Anxiety   . Asthma   . Depression   . GERD (gastroesophageal reflux disease)   . Hypertension   . Sleep apnea     Encounter Details:  CNP Questionnaire - 12/15/20 1030      Questionnaire   Do you give verbal consent to treat you today? Yes    Visit Setting Church or Organization    Location Patient Served At [facility]    Patient Status Not Applicable    Medical Provider Yes   [facility]   Insurance Uninsured (Includes Orange Card/Care [place])    Intervention Advocate;Assess (including screenings);Support    Housing/Utilities No permanent housing   lives in a boarding house   Transportation Need transportation assistance

## 2020-12-17 ENCOUNTER — Ambulatory Visit: Payer: Self-pay | Admitting: Gerontology

## 2020-12-17 ENCOUNTER — Other Ambulatory Visit: Payer: Self-pay

## 2020-12-17 ENCOUNTER — Encounter: Payer: Self-pay | Admitting: Gerontology

## 2020-12-17 VITALS — BP 128/89 | HR 88 | Temp 97.5°F | Resp 16 | Ht 73.0 in | Wt 209.6 lb

## 2020-12-17 DIAGNOSIS — Z8719 Personal history of other diseases of the digestive system: Secondary | ICD-10-CM

## 2020-12-17 DIAGNOSIS — R7303 Prediabetes: Secondary | ICD-10-CM | POA: Insufficient documentation

## 2020-12-17 DIAGNOSIS — I10 Essential (primary) hypertension: Secondary | ICD-10-CM

## 2020-12-17 DIAGNOSIS — R252 Cramp and spasm: Secondary | ICD-10-CM | POA: Insufficient documentation

## 2020-12-17 DIAGNOSIS — E785 Hyperlipidemia, unspecified: Secondary | ICD-10-CM | POA: Insufficient documentation

## 2020-12-17 DIAGNOSIS — Z87898 Personal history of other specified conditions: Secondary | ICD-10-CM

## 2020-12-17 HISTORY — DX: Hyperlipidemia, unspecified: E78.5

## 2020-12-17 MED ORDER — LOSARTAN POTASSIUM 25 MG PO TABS
25.0000 mg | ORAL_TABLET | Freq: Every day | ORAL | 3 refills | Status: DC
  Filled 2020-12-17 – 2020-12-21 (×3): qty 30, 30d supply, fill #0
  Filled 2021-02-08: qty 30, 30d supply, fill #1

## 2020-12-17 MED ORDER — LOSARTAN POTASSIUM 25 MG PO TABS
25.0000 mg | ORAL_TABLET | Freq: Every day | ORAL | 0 refills | Status: DC
  Filled 2020-12-17: qty 30, 30d supply, fill #0

## 2020-12-17 MED ORDER — ROSUVASTATIN CALCIUM 5 MG PO TABS
5.0000 mg | ORAL_TABLET | Freq: Every day | ORAL | 0 refills | Status: DC
Start: 2020-12-17 — End: 2021-01-21
  Filled 2020-12-17: qty 30, 30d supply, fill #0

## 2020-12-17 MED ORDER — OMEPRAZOLE 20 MG PO CPDR
20.0000 mg | DELAYED_RELEASE_CAPSULE | Freq: Every day | ORAL | 2 refills | Status: DC
  Filled 2020-12-17 – 2021-01-07 (×2): qty 30, 30d supply, fill #0

## 2020-12-17 MED ORDER — MELATONIN 3 MG PO CAPS
6.0000 mg | ORAL_CAPSULE | Freq: Every day | ORAL | 0 refills | Status: DC
  Filled 2020-12-17: qty 30, 15d supply, fill #0

## 2020-12-17 MED ORDER — METFORMIN HCL ER 500 MG PO TB24
500.0000 mg | ORAL_TABLET | Freq: Every day | ORAL | 0 refills | Status: DC
  Filled 2020-12-17: qty 30, 30d supply, fill #0

## 2020-12-17 MED ORDER — DICLOFENAC SODIUM 1 % EX GEL: 2.0000 g | Freq: Four times a day (QID) | CUTANEOUS | 0 refills | Status: DC

## 2020-12-17 NOTE — Progress Notes (Signed)
Established Patient Office Visit  Subjective:  Patient ID: Jason Livingston, male    DOB: [birth year]  Age: 55 y.o. MRN: [id number]  CC:  Chief Complaint  Patient presents with  . Follow-up    Labs, CT Scan    HPI Jason Livingston a 55 y/o male who has a history of Anxiety, Asthma, Depression, Jerrye Bushy and Hypertension, presents for lab review. His HgbA1c was 6.2%, Lipid Total cholesterol 232 mg/dl, Triglycerides 234, LDL 141 mg/dl. He continues to smoke 1 1/2 pack of cigarette daily and admits the desire to quit. He states that melatonin is not helping with his insomnia,and he drinks 1-2 cans of beer to help him sleep. He also reports intermitent muscle cramps to bilateral lower legs that radiates to his thighs, which has been going on for 5 months. He states that walking up an incline or down the steps aggravate symptoms, but sitting relieves symptoms. He had Low dose lung CT scan done on 12/14/20, and it showed One vessel coronary atherosclerosis and Aortic Atherosclerosis and Emphysema  Overall, he states that he's doing well and offers no further complaint.  Past Medical History:  Diagnosis Date  . Anxiety   . Asthma   . Depression   . GERD (gastroesophageal reflux disease)   . Hypertension   . Sleep apnea     Past Surgical History:  Procedure Laterality Date  . MANDIBLE FRACTURE SURGERY      Family History  Problem Relation Age of Onset  . Healthy Mother   . Other Father        unknwon medical history  . Hypertension Maternal Grandmother   . Heart disease Maternal Grandfather   . Other Cousin        kidney transplant    Social History   Socioeconomic History  . Marital status: Single    Spouse name: Not on file  . Number of children: Not on file  . Years of education: Not on file  . Highest education level: Not on file  Occupational History  . Not on file  Tobacco Use  . Smoking status: Current Every Day Smoker    Packs/day: 1.50    Years: 34.00    Pack years:  51.00    Types: Cigarettes  . Smokeless tobacco: Current User  Vaping Use  . Vaping Use: Never used  Substance and Sexual Activity  . Alcohol use: Yes    Alcohol/week: 2.0 standard drinks    Types: 2 Cans of beer per week    Comment: 2 40oz beers at night to sleep  . Drug use: Not Currently  . Sexual activity: Not Currently  Other Topics Concern  . Not on file  Social History Narrative  . Not on file   Social Determinants of Health   Financial Resource Strain: Not on file  Food Insecurity: No Food Insecurity  . Worried About Charity fundraiser in the Last Year: Never true  . Ran Out of Food in the Last Year: Never true  Transportation Needs: Unmet Transportation Needs  . Lack of Transportation (Medical): Yes  . Lack of Transportation (Non-Medical): Yes  Physical Activity: Not on file  Stress: Not on file  Social Connections: Not on file  Intimate Partner Violence: Not on file    Outpatient Medications Prior to Visit  Medication Sig Dispense Refill  . albuterol (VENTOLIN HFA) 108 (90 Base) MCG/ACT inhaler Inhale 2 puffs into the lungs every 6 (six) hours as needed for wheezing or shortness of  breath. 6.7 g 2  . fluticasone furoate-vilanterol (BREO ELLIPTA) 100-25 MCG/INH AEPB Inhale 1 puff into the lungs daily. 1 each 5  . losartan (COZAAR) 25 MG tablet Take 1 tablet (25 mg total) by mouth daily. 30 tablet 0  . Melatonin 3 MG CAPS Take 1 capsule (3 mg total) by mouth at bedtime. 30 capsule 0  . omeprazole (PRILOSEC) 20 MG capsule Take 1 capsule (20 mg total) by mouth daily. 30 capsule 0   No facility-administered medications prior to visit.    No Known Allergies  ROS Review of Systems  Constitutional: Negative.   Respiratory: Negative.   Cardiovascular: Negative.   Endocrine: Negative.   Musculoskeletal: Positive for myalgias (muscle cramps to legs).  Neurological: Negative.   Psychiatric/Behavioral: Negative.       Objective:    Physical Exam HENT:      Head: Normocephalic and atraumatic.  Eyes:     Extraocular Movements: Extraocular movements intact.     Conjunctiva/sclera: Conjunctivae normal.     Pupils: Pupils are equal, round, and reactive to light.  Cardiovascular:     Rate and Rhythm: Normal rate and regular rhythm.     Pulses: Normal pulses.     Heart sounds: Normal heart sounds.  Pulmonary:     Effort: Pulmonary effort is normal.     Breath sounds: Normal breath sounds.  Musculoskeletal:        General: Tenderness (palpation to bilateral calf muscles) present.  Skin:    General: Skin is warm.  Neurological:     General: No focal deficit present.     Mental Status: He is alert and oriented to person, place, and time. Mental status is at baseline.  Psychiatric:        Mood and Affect: Mood normal.        Behavior: Behavior normal.        Thought Content: Thought content normal.        Judgment: Judgment normal.     BP 128/89 (BP Location: Right Arm, Patient Position: Sitting, Cuff Size: Large)   Pulse 88   Temp (!) 97.5 F (36.4 C)   Resp 16   Ht 6' 1"  (1.854 m)   Wt 209 lb 9.6 oz (95.1 kg)   SpO2 97%   BMI 27.65 kg/m  Wt Readings from Last 3 Encounters:  12/17/20 209 lb 9.6 oz (95.1 kg)  12/14/20 203 lb (92.1 kg)  11/25/20 203 lb 9.6 oz (92.4 kg)     Health Maintenance Due  Topic Date Due  . COVID-19 Vaccine (1) Never done  . HIV Screening  Never done  . Hepatitis C Screening  Never done  . TETANUS/TDAP  Never done  . COLONOSCOPY (Pts 45-15yr Insurance coverage will need to be confirmed)  Never done    There are no preventive care reminders to display for this patient.  No results found for: TSH Lab Results  Component Value Date   WBC 6.2 11/25/2020   HGB 15.7 11/25/2020   HCT 44.8 11/25/2020   MCV 96 11/25/2020   PLT 258 11/25/2020   Lab Results  Component Value Date   NA 144 11/25/2020   K 4.8 11/25/2020   CO2 22 11/25/2020   GLUCOSE 111 (H) 11/25/2020   BUN 14 11/25/2020   CREATININE  1.00 11/25/2020   BILITOT 0.2 11/25/2020   ALKPHOS 70 11/25/2020   AST 15 11/25/2020   ALT 15 11/25/2020   PROT 7.1 11/25/2020   ALBUMIN 4.5 11/25/2020  CALCIUM 9.3 11/25/2020   EGFR 91 11/25/2020   Lab Results  Component Value Date   CHOL 232 (H) 11/25/2020   Lab Results  Component Value Date   HDL 49 11/25/2020   Lab Results  Component Value Date   LDLCALC 141 (H) 11/25/2020   Lab Results  Component Value Date   TRIG 234 (H) 11/25/2020   Lab Results  Component Value Date   CHOLHDL 4.7 11/25/2020   Lab Results  Component Value Date   HGBA1C 6.2 (H) 11/25/2020      Assessment & Plan:    1. Essential hypertension - His blood pressure is under control, he will continue on current treatment regimen, DASH diet and exercise as tolerated - losartan (COZAAR) 25 MG tablet; Take 1 tablet (25 mg total) by mouth daily.  Dispense: 30 tablet; Refill: 3  2. History of insomnia - His Melatonin was increased to 6 mg, educated him about sleep hygiene and he will follow up with [facility] team Ms. H Pruitt. - Melatonin 3 MG CAPS; Take 2 capsules (6 mg total) by mouth at bedtime.  Dispense: 30 capsule; Refill: 0  3. History of gastroesophageal reflux (GERD) -His acid reflux is under control, he will continue on current medication. -Avoid spicy, fatty and fried food -Avoid sodas and sour juices -Avoid heavy meals -Avoid eating 4 hours before bedtime -Elevate head of bed at night - omeprazole (PRILOSEC) 20 MG capsule; Take 1 capsule (20 mg total) by mouth daily.  Dispense: 30 capsule; Refill: 2  4. Muscle cramps - His muscle cramp might be idiopathic, he was advised to perform muscle stretch exercise, wear compression stockings since her stands and walk most of the time at work and to wear comfortable shoes such as New balance. He should also apply Voltaren gel to calm muscle. - diclofenac Sodium (VOLTAREN) 1 % GEL; Apply 2 g topically 4 (four) times daily.  Dispense: 2 g;  Refill: 0  5. Pre-diabetes - His HgbA1c was 6.2%, he was started on Metformin 500 mg daily, educated on medication side effects and to notify clinic . He's to continue on low carb/non concentrated sweet diet and exercise as tolerated. - metFORMIN (GLUCOPHAGE-XR) 500 MG 24 hr tablet; Take 1 tablet (500 mg total) by mouth daily with breakfast.  Dispense: 30 tablet; Refill: 0  6. Elevated lipids -The 10-year ASCVD risk score Mikey Bussing DC Jr., et al., 2013) is: 12.4%   Values used to calculate the score:     Age: 46 years     Sex: Male     Is Non-Hispanic African American: No     Diabetic: No     Tobacco smoker: Yes     Systolic Blood Pressure: 223 mmHg     Is BP treated: Yes     HDL Cholesterol: 49 mg/dL     Total Cholesterol: 232 mg/dL His ASCVD risk was 12.4%, he will start on 5 mg of Crestor, educated about medication side effects and to notify clinic. He will continue on low fat/cholesterol diet and exercise as tolerated. - rosuvastatin (CRESTOR) 5 MG tablet; Take 1 tablet (5 mg total) by mouth daily.  Dispense: 30 tablet; Refill: 0     Follow-up: Return in about 1 month (around 01/19/2021), or if symptoms worsen or fail to improve.    River Mckercher Jerold Coombe, NP

## 2020-12-17 NOTE — Patient Instructions (Addendum)
Leg Cramps Leg cramps occur when one or more muscles tighten and a person has no control over it (involuntary muscle contraction). Muscle cramps are most common in the calf muscles of the leg. They can occur during exercise or at rest. Leg cramps are painful, and they may last for a few seconds to a few minutes. Cramps may return several times before they finally stop. Usually, leg cramps are not caused by a serious medical problem. In many cases, the cause is not known. Some common causes include:  Excessive physical effort (overexertion), such as during intense exercise.  Doing the same motion over and over.  Staying in a certain position for a long period of time.  Improper preparation, form, or technique while doing a sport or an activity.  Dehydration.  Injury.  Side effects of certain medicines.  Abnormally low levels of minerals in your blood (electrolytes), especially potassium and calcium. This could result from: ? Pregnancy. ? Taking diuretic medicines. Follow these instructions at home: Eating and drinking  Drink enough fluid to keep your urine pale yellow. Staying hydrated may help prevent cramps.  Eat a healthy diet that includes plenty of nutrients to help your muscles function. A healthy diet includes fruits and vegetables, lean protein, whole grains, and low-fat or nonfat dairy products. Managing pain, stiffness, and swelling  Try massaging, stretching, and relaxing the affected muscle. Do this for several minutes at a time.  If directed, put ice on areas that are sore or painful after a cramp. To do this: ? Put ice in a plastic bag. ? Place a towel between your skin and the bag. ? Leave the ice on for 20 minutes, 2-3 times a day. ? Remove the ice if your skin turns bright red. This is very important. If you cannot feel pain, heat, or cold, you have a greater risk of damage to the area.  If directed, apply heat to muscles that are tense or tight. Do this before  you exercise, or as often as told by your health care provider. Use the heat source that your health care provider recommends, such as a moist heat pack or a heating pad. To do this: ? Place a towel between your skin and the heat source. ? Leave the heat on for 20-30 minutes. ? Remove the heat if your skin turns bright red. This is especially important if you are unable to feel pain, heat, or cold. You may have a greater risk of getting burned.  Try taking hot showers or baths to help relax tight muscles.      General instructions  If you are having frequent leg cramps, avoid intense exercise for several days.  Take over-the-counter and prescription medicines only as told by your health care provider.  Keep all follow-up visits. This is important. Contact a health care provider if:  Your leg cramps get more severe or more frequent, or they do not improve over time.  Your foot becomes cold, numb, or blue. Summary  Muscle cramps can develop in any muscle, but the most common place is in the calf muscles of the leg.  Leg cramps are painful, and they may last for a few seconds to a few minutes.  Usually, leg cramps are not caused by a serious medical problem. Often, the cause is not known.  Stay hydrated, and take over-the-counter and prescription medicines only as told by your health care provider. This information is not intended to replace advice given to you by your   health care provider. Make sure you discuss any questions you have with your health care provider. Document Revised: 12/18/2019 Document Reviewed: 12/18/2019 Elsevier Patient Education  2021 [place].  Prediabetes Eating Plan Prediabetes is a condition that causes blood sugar (glucose) levels to be higher than normal. This increases the risk for developing type 2 diabetes (type 2 diabetes mellitus). Working with a health care provider or nutrition specialist (dietitian) to make diet and lifestyle changes can help  prevent the onset of diabetes. These changes may help you:  Control your blood glucose levels.  Improve your cholesterol levels.  Manage your blood pressure. What are tips for following this plan? Reading food labels  Read food labels to check the amount of fat, salt (sodium), and sugar in prepackaged foods. Avoid foods that have: ? Saturated fats. ? Trans fats. ? Added sugars.  Avoid foods that have more than 300 milligrams (mg) of sodium per serving. Limit your sodium intake to less than 2,300 mg each day. Shopping  Avoid buying pre-made and processed foods.  Avoid buying drinks with added sugar. Cooking  Cook with olive oil. Do not use butter, lard, or ghee.  Bake, broil, grill, steam, or boil foods. Avoid frying. Meal planning  Work with your dietitian to create an eating plan that is right for you. This may include tracking how many calories you take in each day. Use a food diary, notebook, or mobile application to track what you eat at each meal.  Consider following a Mediterranean diet. This includes: ? Eating several servings of fresh fruits and vegetables each day. ? Eating fish at least twice a week. ? Eating one serving each day of whole grains, beans, nuts, and seeds. ? Using olive oil instead of other fats. ? Limiting alcohol. ? Limiting red meat. ? Using nonfat or low-fat dairy products.  Consider following a plant-based diet. This includes dietary choices that focus on eating mostly vegetables and fruit, grains, beans, nuts, and seeds.  If you have high blood pressure, you may need to limit your sodium intake or follow a diet such as the DASH (Dietary Approaches to Stop Hypertension) eating plan. The DASH diet aims to lower high blood pressure.   Lifestyle  Set weight loss goals with help from your health care team. It is recommended that most people with prediabetes lose 7% of their body weight.  Exercise for at least 30 minutes 5 or more days a  week.  Attend a support group or seek support from a mental health counselor.  Take over-the-counter and prescription medicines only as told by your health care provider. What foods are recommended? Fruits Berries. Bananas. Apples. Oranges. Grapes. Papaya. Mango. Pomegranate. Kiwi. Grapefruit. Cherries. Vegetables Lettuce. Spinach. Peas. Beets. Cauliflower. Cabbage. Broccoli. Carrots. Tomatoes. Squash. Eggplant. Herbs. Peppers. Onions. Cucumbers. Brussels sprouts. Grains Whole grains, such as whole-wheat or whole-grain breads, crackers, cereals, and pasta. Unsweetened oatmeal. Bulgur. Barley. Quinoa. Brown rice. Corn or whole-wheat flour tortillas or taco shells. Meats and other proteins Seafood. Poultry without skin. Lean cuts of pork and beef. Tofu. Eggs. Nuts. Beans. Dairy Low-fat or fat-free dairy products, such as yogurt, cottage cheese, and cheese. Beverages Water. Tea. Coffee. Sugar-free or diet soda. Seltzer water. Low-fat or nonfat milk. Milk alternatives, such as soy or almond milk. Fats and oils Olive oil. Canola oil. Sunflower oil. Grapeseed oil. Avocado. Walnuts. Sweets and desserts Sugar-free or low-fat pudding. Sugar-free or low-fat ice cream and other frozen treats. Seasonings and condiments Herbs. Sodium-free spices. Mustard. Relish.  Low-salt, low-sugar ketchup. Low-salt, low-sugar barbecue sauce. Low-fat or fat-free mayonnaise. The items listed above may not be a complete list of recommended foods and beverages. Contact a dietitian for more information. What foods are not recommended? Fruits Fruits canned with syrup. Vegetables Canned vegetables. Frozen vegetables with butter or cream sauce. Grains Refined white flour and flour products, such as bread, pasta, snack foods, and cereals. Meats and other proteins Fatty cuts of meat. Poultry with skin. Breaded or fried meat. Processed meats. Dairy Full-fat yogurt, cheese, or milk. Beverages Sweetened drinks, such as  iced tea and soda. Fats and oils Butter. Lard. Ghee. Sweets and desserts Baked goods, such as cake, cupcakes, pastries, cookies, and cheesecake. Seasonings and condiments Spice mixes with added salt. Ketchup. Barbecue sauce. Mayonnaise. The items listed above may not be a complete list of foods and beverages that are not recommended. Contact a dietitian for more information. Where to find more information  American Diabetes Association: www.diabetes.org Summary  You may need to make diet and lifestyle changes to help prevent the onset of diabetes. These changes can help you control blood sugar, improve cholesterol levels, and manage blood pressure.  Set weight loss goals with help from your health care team. It is recommended that most people with prediabetes lose 7% of their body weight.  Consider following a Mediterranean diet. This includes eating plenty of fresh fruits and vegetables, whole grains, beans, nuts, seeds, fish, and low-fat dairy, and using olive oil instead of other fats. This information is not intended to replace advice given to you by your health care provider. Make sure you discuss any questions you have with your health care provider. Document Revised: 10/31/2019 Document Reviewed: 10/31/2019 Elsevier Patient Education  2021 [place] Heart-healthy meal planning includes:  Eating less unhealthy fats.  Eating more healthy fats.  Making other changes in your diet. Talk with your doctor or a diet specialist (dietitian) to create an eating plan that is right for you. What is my plan? Your doctor may recommend an eating plan that includes:  Total fat: ______% or less of total calories a day.  Saturated fat: ______% or less of total calories a day.  Cholesterol: less than _________mg a day. What are tips for following this plan? Cooking Avoid frying your food. Try to bake, boil, grill, or broil it instead. You can also reduce  fat by:  Removing the skin from poultry.  Removing all visible fats from meats.  Steaming vegetables in water or broth. Meal planning  At meals, divide your plate into four equal parts: ? Fill one-half of your plate with vegetables and green salads. ? Fill one-fourth of your plate with whole grains. ? Fill one-fourth of your plate with lean protein foods.  Eat 4-5 servings of vegetables per day. A serving of vegetables is: ? 1 cup of raw or cooked vegetables. ? 2 cups of raw leafy greens.  Eat 4-5 servings of fruit per day. A serving of fruit is: ? 1 medium whole fruit. ?  cup of dried fruit. ?  cup of fresh, frozen, or canned fruit. ?  cup of 100% fruit juice.  Eat more foods that have soluble fiber. These are apples, broccoli, carrots, beans, peas, and barley. Try to get 20-30 g of fiber per day.  Eat 4-5 servings of nuts, legumes, and seeds per week: ? 1 serving of dried beans or legumes equals  cup after being cooked. ? 1 serving of nuts is  cup. ? 1 serving of seeds equals 1 tablespoon.   General information  Eat more home-cooked food. Eat less restaurant, buffet, and fast food.  Limit or avoid alcohol.  Limit foods that are high in starch and sugar.  Avoid fried foods.  Lose weight if you are overweight.  Keep track of how much salt (sodium) you eat. This is important if you have high blood pressure. Ask your doctor to tell you more about this.  Try to add vegetarian meals each week. Fats  Choose healthy fats. These include olive oil and canola oil, flaxseeds, walnuts, almonds, and seeds.  Eat more omega-3 fats. These include salmon, mackerel, sardines, tuna, flaxseed oil, and ground flaxseeds. Try to eat fish at least 2 times each week.  Check food labels. Avoid foods with trans fats or high amounts of saturated fat.  Limit saturated fats. ? These are often found in animal products, such as meats, butter, and cream. ? These are also found in plant  foods, such as palm oil, palm kernel oil, and coconut oil.  Avoid foods with partially hydrogenated oils in them. These have trans fats. Examples are stick margarine, some tub margarines, cookies, crackers, and other baked goods. What foods can I eat? Fruits All fresh, canned (in natural juice), or frozen fruits. Vegetables Fresh or frozen vegetables (raw, steamed, roasted, or grilled). Green salads. Grains Most grains. Choose whole wheat and whole grains most of the time. Rice and pasta, including brown rice and pastas made with whole wheat. Meats and other proteins Lean, well-trimmed beef, veal, pork, and lamb. Chicken and [place] without skin. All fish and shellfish. Wild duck, rabbit, pheasant, and venison. Egg whites or low-cholesterol egg substitutes. Dried beans, peas, lentils, and tofu. Seeds and most nuts. Dairy Low-fat or nonfat cheeses, including ricotta and mozzarella. Skim or 1% milk that is liquid, powdered, or evaporated. Buttermilk that is made with low-fat milk. Nonfat or low-fat yogurt. Fats and oils Non-hydrogenated (trans-free) margarines. Vegetable oils, including soybean, sesame, sunflower, olive, peanut, safflower, corn, canola, and cottonseed. Salad dressings or mayonnaise made with a vegetable oil. Beverages Mineral water. Coffee and tea. Diet carbonated beverages. Sweets and desserts Sherbet, gelatin, and fruit ice. Small amounts of dark chocolate. Limit all sweets and desserts. Seasonings and condiments All seasonings and condiments. The items listed above may not be a complete list of foods and drinks you can eat. Contact a dietitian for more options. What foods should I avoid? Fruits Canned fruit in heavy syrup. Fruit in cream or butter sauce. Fried fruit. Limit coconut. Vegetables Vegetables cooked in cheese, cream, or butter sauce. Fried vegetables. Grains Breads that are made with saturated or trans fats, oils, or whole milk. Croissants. Sweet rolls.  Donuts. High-fat crackers, such as cheese crackers. Meats and other proteins Fatty meats, such as hot dogs, ribs, sausage, bacon, rib-eye roast or steak. High-fat deli meats, such as salami and bologna. Caviar. Domestic duck and goose. Organ meats, such as liver. Dairy Cream, sour cream, cream cheese, and creamed cottage cheese. Whole-milk cheeses. Whole or 2% milk that is liquid, evaporated, or condensed. Whole buttermilk. Cream sauce or high-fat cheese sauce. Yogurt that is made from whole milk. Fats and oils Meat fat, or shortening. Cocoa butter, hydrogenated oils, palm oil, coconut oil, palm kernel oil. Solid fats and shortenings, including bacon fat, salt pork, lard, and butter. Nondairy cream substitutes. Salad dressings with cheese or sour cream. Beverages Regular sodas and juice drinks with added sugar. Sweets and desserts Frosting. Pudding.  Cookies. Cakes. Pies. Milk chocolate or white chocolate. Buttered syrups. Full-fat ice cream or ice cream drinks. The items listed above may not be a complete list of foods and drinks to avoid. Contact a dietitian for more information. Summary  Heart-healthy meal planning includes eating less unhealthy fats, eating more healthy fats, and making other changes in your diet.  Eat a balanced diet. This includes fruits and vegetables, low-fat or nonfat dairy, lean protein, nuts and legumes, whole grains, and heart-healthy oils and fats. This information is not intended to replace advice given to you by your health care provider. Make sure you discuss any questions you have with your health care provider. Document Revised: 10/05/2017 Document Reviewed: 09/08/2017 Elsevier Patient Education  2021 [employer].

## 2020-12-18 ENCOUNTER — Other Ambulatory Visit: Payer: Self-pay

## 2020-12-18 MED ORDER — TRAZODONE HCL 100 MG PO TABS
ORAL_TABLET | ORAL | 1 refills | Status: DC
  Filled 2020-12-18: qty 30, 30d supply, fill #0

## 2020-12-21 ENCOUNTER — Other Ambulatory Visit: Payer: Self-pay

## 2020-12-21 ENCOUNTER — Encounter: Payer: Self-pay | Admitting: *Deleted

## 2020-12-24 ENCOUNTER — Other Ambulatory Visit (HOSPITAL_BASED_OUTPATIENT_CLINIC_OR_DEPARTMENT_OTHER): Payer: Self-pay

## 2020-12-31 ENCOUNTER — Other Ambulatory Visit: Payer: Self-pay

## 2021-01-01 ENCOUNTER — Other Ambulatory Visit: Payer: Self-pay

## 2021-01-01 ENCOUNTER — Other Ambulatory Visit: Payer: Self-pay | Admitting: Gerontology

## 2021-01-01 DIAGNOSIS — Z8709 Personal history of other diseases of the respiratory system: Secondary | ICD-10-CM

## 2021-01-01 MED ORDER — BREO ELLIPTA 100-25 MCG/INH IN AEPB
1.0000 | INHALATION_SPRAY | Freq: Every day | RESPIRATORY_TRACT | 5 refills | Status: DC
  Filled 2021-01-01: qty 60, 30d supply, fill #0
  Filled 2021-02-09 – 2021-02-23 (×3): qty 60, 30d supply, fill #1

## 2021-01-04 ENCOUNTER — Other Ambulatory Visit: Payer: Self-pay

## 2021-01-05 ENCOUNTER — Other Ambulatory Visit: Payer: Self-pay

## 2021-01-05 ENCOUNTER — Ambulatory Visit: Payer: Medicaid Other | Admitting: Pharmacy Technician

## 2021-01-05 DIAGNOSIS — Z79899 Other long term (current) drug therapy: Secondary | ICD-10-CM

## 2021-01-05 NOTE — Progress Notes (Signed)
Completed [facility] application and contract.  Patient agreed to all terms of the [facility] contract.    Patient approved to receive medication assistance at [facility] until time for re-certification in 2023, and as long as eligibility criteria continues to be met.    Provided patient with community resource material based on his particular needs.    Betty J. Kluttz Care Manager [facility]  

## 2021-01-07 ENCOUNTER — Other Ambulatory Visit: Payer: Self-pay

## 2021-01-13 ENCOUNTER — Other Ambulatory Visit: Payer: Self-pay

## 2021-01-19 ENCOUNTER — Ambulatory Visit: Payer: Self-pay | Admitting: Gastroenterology

## 2021-01-21 ENCOUNTER — Other Ambulatory Visit: Payer: Self-pay

## 2021-01-21 ENCOUNTER — Ambulatory Visit: Payer: Medicaid Other | Admitting: Gerontology

## 2021-01-21 ENCOUNTER — Encounter: Payer: Self-pay | Admitting: Gerontology

## 2021-01-21 VITALS — BP 142/95 | HR 90 | Temp 97.2°F | Resp 16 | Ht 73.0 in | Wt 205.3 lb

## 2021-01-21 DIAGNOSIS — I10 Essential (primary) hypertension: Secondary | ICD-10-CM

## 2021-01-21 DIAGNOSIS — Z87898 Personal history of other specified conditions: Secondary | ICD-10-CM

## 2021-01-21 DIAGNOSIS — R7303 Prediabetes: Secondary | ICD-10-CM

## 2021-01-21 DIAGNOSIS — E785 Hyperlipidemia, unspecified: Secondary | ICD-10-CM

## 2021-01-21 MED ORDER — ROSUVASTATIN CALCIUM 5 MG PO TABS
5.0000 mg | ORAL_TABLET | Freq: Every day | ORAL | 2 refills | Status: DC
  Filled 2021-01-21: qty 30, 30d supply, fill #0

## 2021-01-21 NOTE — Progress Notes (Signed)
Established Patient Office Visit  Subjective:  Patient ID: Jason Livingston, male    DOB: Aug 09, 1966  Age: 55 y.o. MRN: [id number]  CC:  Chief Complaint  Patient presents with   Follow-up    Pre-diabetes. Patient states he is not able to tolerate the Metformin due to nausea and diarrhea.    HPI Jason Livingston  is a 55 y/o male who has history of Prediabetes, Hypertension, Asthma, Anxiety, Depression, GERD presents for routine follow up. His blood pressure was elevated during visit and he states that he's compliant with taking his Losartan, though does not check his blood pressure at home. He denies chest pain,  palpitation, dizziness and vision changes.He continues to smoke 1 pack of cigarette daily and admits the desire to quit. He had Low dose lung Ct done on 12/14/20 and it showed Lung-RADS 2, benign appearance or behavior. Continue annual screening with low-dose chest CT without contrast in 12 months. 2. One vessel coronary atherosclerosis. 3. Aortic Atherosclerosis and Emphysema. He reports that he self discontinued Metformin because of GI symptoms. He admits that he was taking Metformin without food. He reports that he does not have any Anxiety and Depression , but continues to experience insomnia and was prescribed Trazodone. He reports that he has not been taking Trazodone because he was  informed by the Pharmacy that Priapism was one of its side effects. He states that he will prefer [facility] behavioral health. Overall, he states that he's doing well and offers no further complaint.  Past Medical History:  Diagnosis Date   Anxiety    Asthma    Depression    GERD (gastroesophageal reflux disease)    Hypertension    Pre-diabetes    Sleep apnea     Past Surgical History:  Procedure Laterality Date   MANDIBLE FRACTURE SURGERY      Family History  Problem Relation Age of Onset   Other Mother        unknown medical history   Other Father        unknwon medical history    Hypertension Maternal Grandmother    Heart disease Maternal Grandfather    Other Cousin        kidney transplant    Social History   Socioeconomic History   Marital status: Single    Spouse name: Not on file   Number of children: Not on file   Years of education: Not on file   Highest education level: Not on file  Occupational History   Not on file  Tobacco Use   Smoking status: Every Day    Packs/day: 1.00    Years: 34.00    Pack years: 34.00    Types: Cigarettes   Smokeless tobacco: Former    Quit date: 2012  Vaping Use   Vaping Use: Never used  Substance and Sexual Activity   Alcohol use: Yes    Alcohol/week: 2.0 standard drinks    Types: 2 Cans of beer per week    Comment: 2 40oz beers at night to sleep   Drug use: Not Currently    Types: Cocaine, Marijuana, LSD    Comment: used in his 20's, has not used in 30 years   Sexual activity: Not Currently  Other Topics Concern   Not on file  Social History Narrative   Not on file   Social Determinants of Health   Financial Resource Strain: Not on file  Food Insecurity: No Food Insecurity   Worried About Running Out  of Food in the Last Year: Never true   Ran Out of Food in the Last Year: Never true  Transportation Needs: Unmet Transportation Needs   Lack of Transportation (Medical): Yes   Lack of Transportation (Non-Medical): Yes  Physical Activity: Not on file  Stress: Not on file  Social Connections: Not on file  Intimate Partner Violence: Not on file    Outpatient Medications Prior to Visit  Medication Sig Dispense Refill   albuterol (VENTOLIN HFA) 108 (90 Base) MCG/ACT inhaler Inhale 2 puffs into the lungs every 6 (six) hours as needed for wheezing or shortness of breath. 6.7 g 2   diclofenac Sodium (VOLTAREN) 1 % GEL Apply 2 g topically 4 (four) times daily. 2 g 0   fluticasone furoate-vilanterol (BREO ELLIPTA) 100-25 MCG/INH AEPB Inhale 1 puff into the lungs once daily. 60 each 5   losartan (COZAAR) 25 MG  tablet Take 1 tablet (25 mg total) by mouth daily. 30 tablet 3   Melatonin 3 MG CAPS Take 2 capsules (6 mg total) by mouth at bedtime. 30 capsule 0   omeprazole (PRILOSEC) 20 MG capsule Take 1 capsule (20 mg total) by mouth daily. 30 capsule 2   rosuvastatin (CRESTOR) 5 MG tablet Take 1 tablet (5 mg total) by mouth daily. 30 tablet 0   traZODone (DESYREL) 100 MG tablet TAKE (1/2) TO (1) TABLET  BY MOUTH ONCE DAILY AT BEDTIME AS NEEDED. (Patient not taking: Reported on 01/21/2021) 30 tablet 1   metFORMIN (GLUCOPHAGE-XR) 500 MG 24 hr tablet Take 1 tablet (500 mg total) by mouth daily with breakfast. (Patient not taking: Reported on 01/21/2021) 30 tablet 0   No facility-administered medications prior to visit.    No Known Allergies  ROS Review of Systems  Constitutional: Negative.   Eyes: Negative.   Respiratory: Negative.    Cardiovascular: Negative.   Endocrine: Negative.   Skin: Negative.   Neurological: Negative.   Psychiatric/Behavioral: Negative.       Objective:    Physical Exam HENT:     Head: Normocephalic and atraumatic.  Eyes:     Extraocular Movements: Extraocular movements intact.     Conjunctiva/sclera: Conjunctivae normal.     Pupils: Pupils are equal, round, and reactive to light.  Cardiovascular:     Rate and Rhythm: Normal rate and regular rhythm.     Pulses: Normal pulses.     Heart sounds: Normal heart sounds.  Pulmonary:     Effort: Pulmonary effort is normal.     Breath sounds: Normal breath sounds.  Skin:    General: Skin is warm.  Neurological:     General: No focal deficit present.     Mental Status: He is alert and oriented to person, place, and time. Mental status is at baseline.  Psychiatric:        Mood and Affect: Mood normal.        Behavior: Behavior normal.        Thought Content: Thought content normal.        Judgment: Judgment normal.    BP (!) 142/95 (BP Location: Left Arm, Patient Position: Sitting, Cuff Size: Large)   Pulse 90    Temp (!) 97.2 F (36.2 C)   Resp 16   Ht 6' 1"  (1.854 m)   Wt 205 lb 4.8 oz (93.1 kg)   SpO2 95%   BMI 27.09 kg/m  Wt Readings from Last 3 Encounters:  01/21/21 205 lb 4.8 oz (93.1 kg)  12/17/20 209 lb  9.6 oz (95.1 kg)  12/14/20 203 lb (92.1 kg)     Health Maintenance Due  Topic Date Due   COVID-19 Vaccine (1) Never done   Pneumococcal Vaccine 34-34 Years old (1 - PCV) Never done   HIV Screening  Never done   Hepatitis C Screening  Never done   TETANUS/TDAP  Never done   Zoster Vaccines- Shingrix (1 of 2) Never done   COLONOSCOPY (Pts 45-61yr Insurance coverage will need to be confirmed)  Never done    There are no preventive care reminders to display for this patient.  No results found for: TSH Lab Results  Component Value Date   WBC 6.2 11/25/2020   HGB 15.7 11/25/2020   HCT 44.8 11/25/2020   MCV 96 11/25/2020   PLT 258 11/25/2020   Lab Results  Component Value Date   NA 144 11/25/2020   K 4.8 11/25/2020   CO2 22 11/25/2020   GLUCOSE 111 (H) 11/25/2020   BUN 14 11/25/2020   CREATININE 1.00 11/25/2020   BILITOT 0.2 11/25/2020   ALKPHOS 70 11/25/2020   AST 15 11/25/2020   ALT 15 11/25/2020   PROT 7.1 11/25/2020   ALBUMIN 4.5 11/25/2020   CALCIUM 9.3 11/25/2020   EGFR 91 11/25/2020   Lab Results  Component Value Date   CHOL 232 (H) 11/25/2020   Lab Results  Component Value Date   HDL 49 11/25/2020   Lab Results  Component Value Date   LDLCALC 141 (H) 11/25/2020   Lab Results  Component Value Date   TRIG 234 (H) 11/25/2020   Lab Results  Component Value Date   CHOLHDL 4.7 11/25/2020   Lab Results  Component Value Date   HGBA1C 6.2 (H) 11/25/2020      Assessment & Plan:   1. Pre-diabetes - His HgbA1c was 6.2%, he was advised to take Metformin with food, and he verbalized understanding, and will notify clinic for GI symptoms. -He was encouraged to continue on low carb/non concentrated sweet diet and exercise as tolerated.  2. Elevated  lipids - He will continue on current medication, low fat/cholesterol diet and exercise as tolerated. - rosuvastatin (CRESTOR) 5 MG tablet; Take 1 tablet (5 mg total) by mouth daily.  Dispense: 30 tablet; Refill: 2  3. Essential hypertension -His blood pressure is not under control, -Low salt DASH diet -Take medications regularly on time -Exercise regularly as tolerated -Check blood pressure at least once a week at home or a nearby pharmacy and record -Goal is less than 140/90 and normal blood pressure is 120/80   4. History of insomnia - He was advised to continue on Trazodone, [facility] HJ[facility]ilyn [facility]     Follow-up: Return in about 4 weeks (around 02/18/2021), or if symptoms worsen or fail to improve.    Chevi Lim EJerold Coombe NP

## 2021-01-21 NOTE — Patient Instructions (Signed)

## 2021-01-28 ENCOUNTER — Ambulatory Visit: Payer: Self-pay | Admitting: Licensed Clinical Social Worker

## 2021-01-28 ENCOUNTER — Other Ambulatory Visit: Payer: Self-pay

## 2021-01-28 ENCOUNTER — Institutional Professional Consult (permissible substitution): Payer: Medicaid Other | Admitting: Licensed Clinical Social Worker

## 2021-01-28 DIAGNOSIS — Z7689 Persons encountering health services in other specified circumstances: Secondary | ICD-10-CM

## 2021-01-28 NOTE — BH Specialist Note (Addendum)
ADULT Comprehensive Clinical Assessment (CCA) Note   01/28/2021 [name] [id number]   Referring Provider: Carlyon Shadow, NP 60 minutes. Telephone Visit: Patient consents to telephone visit and 2 patient identifiers were used to identify patient   SUBJECTIVE: Jason Livingston is a 55 y.o.   male accompanied by  himself  Ismail Graziani was seen in consultation at the request of Carlyon Shadow, NP of the [facility] for evaluation of  sleep disturbance and ADHD concerns .  Types of Service: Comprehensive Clinical Assessment (CCA)  Reason for referral in patient/family's own words:  The patient noted that he has experienced trouble falling asleep and staying asleep since childhood.     He likes to be called Jason Livingston.  He came to the appointment with  himself .  Primary language at home is [place].  Constitutional Appearance: cooperative, and alert   (Patient to answer as appropriate) Gender identity: Male Sex assigned at birth: Male Pronouns: He, His, Him    Mental status exam:   General Appearance /Behavior:  unable to access; telephone visit Eye Contact:  unable to access; telephone visit Motor Behavior:  unable to access; telephone visit Speech:  Normal Level of Consciousness:  Alert Mood:  Euthymic Affect:  Appropriate Anxiety Level:  None Thought Process:  Coherent Thought Content:  WNL Perception:  Normal Judgment:  Fair Insight:  Present   Current Medications and therapies: He is taking:   Outpatient Encounter Medications as of 01/28/2021  Medication Sig   albuterol (VENTOLIN HFA) 108 (90 Base) MCG/ACT inhaler Inhale 2 puffs into the lungs every 6 (six) hours as needed for wheezing or shortness of breath.   diclofenac Sodium (VOLTAREN) 1 % GEL Apply 2 g topically 4 (four) times daily.   fluticasone furoate-vilanterol (BREO ELLIPTA) 100-25 MCG/INH AEPB Inhale 1 puff into the lungs once daily.   losartan (COZAAR) 25 MG tablet Take 1 tablet (25 mg  total) by mouth daily.   Melatonin 3 MG CAPS Take 2 capsules (6 mg total) by mouth at bedtime.   omeprazole (PRILOSEC) 20 MG capsule Take 1 capsule (20 mg total) by mouth daily.   rosuvastatin (CRESTOR) 5 MG tablet Take 1 tablet (5 mg total) by mouth daily.   [DISCONTINUED] traZODone (DESYREL) 100 MG tablet TAKE (1/2) TO (1) TABLET  BY MOUTH ONCE DAILY AT BEDTIME AS NEEDED. (Patient not taking: Reported on 01/21/2021)   No facility-administered encounter medications on file as of 01/28/2021.     Therapies:   [facility] - The patient attended one group meeting for anxiety last month. He decided it was not what he needed. Dr. Elza Rafter, MD prescribed Trazodone 100 MG but the patient has not started the medication due to concerns regarding side effects.   Family history: Family mental illness:  No known history of anxiety disorder, panic disorder, social anxiety disorder, depression, suicide attempt, suicide completion, bipolar disorder, schizophrenia, eating disorder, personality disorder, OCD, PTSD, ADHD Family school achievement history:  No known history of autism, learning disability, intellectual disability Other relevant family history:  No known history of substance use or alcoholism  Social History: Now living with  alone in boarding house . 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 Self-injury:  No Suicidal ideation:  No Suicide attempt:  No  Additional Anxiety Concerns: Panic attacks:  No Obsessions:  No Compulsions:  No  Stressors:  Work environment  Alcohol and/or Substance Use: Have you recently consumed alcohol? yes  Have you recently used any drugs?  no  Have you recently consumed any tobacco? yes Does patient seem concerned about dependence or abuse of any substance? no  Substance Use Disorder Checklist:    Severity Risk  Scoring based on DSM-5 Criteria for Substance Use Disorder. The presence of at least two (2) criteria in the last 12 months indicate a substance use disorder. The severity of the substance use disorder is defined as:  Mild: Presence of 2-3 criteria Moderate: Presence of 4-5 criteria Severe: Presence of 6 or more criteria  Traumatic Experiences: History or current traumatic events (natural disaster, house fire, etc.)? no History or current physical trauma?  no History or current emotional trauma?  no History or current sexual trauma?  no History or current domestic or intimate partner violence?  no History of bullying:  no  Risk Assessment: Suicidal or homicidal thoughts?   no Self injurious behaviors?  no Guns in the home?  no  Self Harm Risk Factors: Loss (financial/interpersonal/professional)  Self Harm Thoughts?: No  Patient and/or Family's Strengths/Protective Factors: Concrete supports in place (healthy food, safe environments, etc.) and Sense of purpose  Patient's and/or Family's Goals in their own words: Patient stated, " I would like to sleep better."  Interventions: Interventions utilized:  Sleep Hygiene   Patient and/or Family Response: Patient agrees to case consultation and to return for a follow-up appointment.   Standardized Assessments completed: GAD-7 and PHQ 9 PHQ-9   10 Gad-7      2  Assessments/Outcomes:  Jason Livingston is a 55 y.o. Caucasian male who presents today for a mental health assessment via telephone due to COVID precautions and was referred by Carlyon Shadow, NP of the [facility]. Jason Livingston denies any current symptoms of anxiety and depression. He states he felt  down and a little depressed in April but has since improved. He endorsed difficulty falling asleep and staying asleep that began in early childhood. He stated he experienced nightmares as a young child but not as an adult. He endorsed snoring and occasionally talking in  his sleep. Jason Livingston has never had a sleep study. Octave denied any previous mental health hospitalizations. Further, he denied ever attempting to end his life. The patient denied any suicidal or homicidal thoughts. Jason Livingston was previously seen by [facility] and was prescribed Trazodone 100 MG by Dr Elza Rafter, MD. However, the patient was concerned about priapism and did not take the medication. The patient was diagnosed with ADHD as a child and recalls taking Ritalin daily, for many years. He denied any other psychotropic medication use. He denied any current or previous substance use history. Matheus endorsed consuming two-twelve ounce beers before bed. He stated he did have a time in his life where his friends and family were concerned he drank alcohol in excess, but notes that was many years ago. Gaetan stated he does not feel like he currently has a problem with alcohol. He endorsed smoking  cigarettes, 1 PPD.  Trevon is a newly established patient with the [facility] his last visit with Carlyon Shadow, NP was on 01/21/2021. The patient has a history of Prediabetes, hypertension , Asthma, Anxiety, Depression, GERD, and insomnia. He has no know allergies or adverse reactions to medications.  Conner was born in [place] and raised by his mother in [place]. His father left when he was 27 y.o. He described his childhood as,"challenging for me  and my mother loved me and there was always love in the home." He described school as hard because he was always fidgety and had difficulty academically. He noted that he dropped out of high school his senior year, but obtained his GED a few years later. Roye stated that he went to an online community college twice for an Psychiatric nurse but did not complete the program. The patient endorsed legal trouble in [place] and was incarcerated for nine months there. After he moved to [place] he was charged with trespassing when he was homeless for several months. He never married or had  children. He described two long term relationships in his life each lasting 3-4 years. 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 He feels this is the only stress in his life due to fear of heights. Daryll lives alone at a local boarding house. He describes his faith, church family, and boss as his supports in his life.   Patient Centered Plan: Patient is on the following Treatment Plan(s): CBT 8 weeks .  Coordination of Care: Case consultation with Dr. Octavia Heir, MD, Psychiatric Consultant on Tuesday  02/02/2021 at 11:00 AM   DSM-5 Diagnosis: .  Recommendations for Services/Supports/Treatments: Per Case consultation with Dr. Octavia Heir, MD, Psychiatric Consultant on Tuesday  02/02/2021 at 11:00 AM it is recommended that the patient begin CBT-I  therapy along with starting Mirtazapine 7.5 MG at bedtime.  .  Progress towards Goals: Other  Treatment Plan Summary: Behavioral Health Clinician will: Assess individual's status and evaluate for psychiatric symptoms  Individual will:  Be on time for their follow-up appointment   Referral(s): [place] (In Clinic)  Lesli Albee, [place]

## 2021-02-02 ENCOUNTER — Telehealth: Payer: Self-pay

## 2021-02-02 NOTE — Telephone Encounter (Signed)
   Nate Common DOB: 1966-08-04 MRN: [id number]   RIDER WAIVER AND RELEASE OF LIABILITY  For purposes of improving physical access to our facilities, [place] is pleased to partner with third parties to provide [place] patients or other authorized individuals the option of convenient, on-demand ground transportation services (the [place]") through use of the technology service that enables users to request on-demand ground transportation from independent third-party providers.  By opting to use and accept these [employer], I, the undersigned, hereby agree on behalf of myself, and on behalf of any minor child using the Government social research officer for whom I am the parent or legal guardian, as follows:  Government social research officer provided to me are provided by independent third-party transportation providers who are not [employer] or employees and who are unaffiliated with [employer]. [facility] is neither a transportation carrier nor a common or public carrier. [facility] has no control over the quality or safety of the transportation that occurs as a result of the [employer]. [facility] cannot guarantee that any third-party transportation provider will complete any arranged transportation service. [facility] makes no representation, warranty, or guarantee regarding the reliability, timeliness, quality, safety, suitability, or availability of any of the Transport Services or that they will be error free. I fully understand that traveling by vehicle involves risks and dangers of serious bodily injury, including permanent disability, paralysis, and death. I agree, on behalf of myself and on behalf of any minor child using the Transport Services for whom I am the parent or legal guardian, that the entire risk arising out of my use of the [employer] remains solely with me, to the maximum extent permitted under applicable law. The [employer] are provided "as  is" and "as available." [facility] disclaims all representations and warranties, express, implied or statutory, not expressly set out in these terms, including the implied warranties of merchantability and fitness for a particular purpose. I hereby waive and release [facility], its agents, employees, officers, directors, representatives, insurers, attorneys, assigns, successors, subsidiaries, and affiliates from any and all past, present, or future claims, demands, liabilities, actions, causes of action, or suits of any kind directly or indirectly arising from acceptance and use of the [employer]. I further waive and release [place] and its affiliates from all present and future liability and responsibility for any injury or death to persons or damages to property caused by or related to the use of the [employer]. I have read this Waiver and Release of Liability, and I understand the terms used in it and their legal significance. This Waiver is freely and voluntarily given with the understanding that my right (as well as the right of any minor child for whom I am the parent or legal guardian using the [employer]) to legal recourse against [place] in connection with the [employer] is knowingly surrendered in return for use of these services.   I attest that I read the consent document to [name], gave Mr. Pettinger the opportunity to ask questions and answered the questions asked (if any). I affirm that [name] then provided consent for he's participation in this program.     Cameron Proud

## 2021-02-02 NOTE — Congregational Nurse Program (Signed)
  Dept: [phone]   Congregational Nurse Program Note  Date of Encounter: 02/02/2021 Client to clinic today for BP check and also for transportation set up for ortho appointment at [facility] on 6/23. Transportation set up with [facility] transport. Information given to client. Transport with pick him up at 1:45 pm at his boarding house for the 2:45pm appointment in [place]. Client plans to return to clinic prior to his appointment on Thursday.   Past Medical History: Past Medical History:  Diagnosis Date   Anxiety    Asthma    Depression    GERD (gastroesophageal reflux disease)    Hypertension    Pre-diabetes    Sleep apnea     Encounter Details:  CNP Questionnaire - 02/02/21 1100       Questionnaire   Do you give verbal consent to treat you today? Yes    Visit Setting Church or Organization    Location Patient Served At [facility]    Patient Status Not Applicable    Medical Provider Yes   [facility]   Insurance Uninsured (Includes [employer] Card/Care [place])    Intervention Advocate;Assess (including screenings);Support    Housing/Utilities No permanent housing   lives in a boarding house   Transportation Need transportation assistance

## 2021-02-04 ENCOUNTER — Other Ambulatory Visit: Payer: Self-pay

## 2021-02-04 ENCOUNTER — Ambulatory Visit (INDEPENDENT_AMBULATORY_CARE_PROVIDER_SITE_OTHER): Payer: Self-pay

## 2021-02-04 ENCOUNTER — Encounter: Payer: Self-pay | Admitting: Orthopedic Surgery

## 2021-02-04 ENCOUNTER — Ambulatory Visit (INDEPENDENT_AMBULATORY_CARE_PROVIDER_SITE_OTHER): Payer: Medicaid Other | Admitting: Physician Assistant

## 2021-02-04 DIAGNOSIS — M5442 Lumbago with sciatica, left side: Secondary | ICD-10-CM

## 2021-02-04 DIAGNOSIS — M5441 Lumbago with sciatica, right side: Secondary | ICD-10-CM

## 2021-02-04 DIAGNOSIS — G8929 Other chronic pain: Secondary | ICD-10-CM

## 2021-02-04 DIAGNOSIS — M25512 Pain in left shoulder: Secondary | ICD-10-CM

## 2021-02-04 MED ORDER — PREDNISONE 10 MG PO TABS
20.0000 mg | ORAL_TABLET | Freq: Every day | ORAL | 0 refills | Status: DC
  Filled 2021-02-04: qty 30, 15d supply, fill #0

## 2021-02-04 MED ORDER — PREDNISONE 10 MG PO TABS
20.0000 mg | ORAL_TABLET | Freq: Every day | ORAL | 0 refills | Status: DC
  Filled 2021-02-04: qty 60, 30d supply, fill #0

## 2021-02-04 NOTE — Progress Notes (Signed)
Office Visit Note   Patient: Jason Livingston           Date of Birth: [birth year]           MRN: [id number] Visit Date: 02/04/2021              Requested by: [facility], [place] [address] [place],  [place] [address] PCP: [facility] [place]  Chief Complaint  Patient presents with   Left Shoulder - Pain   Lower Back - Pain      HPI: Patient is a pleasant 55 year old gentleman with 3 separate issues.  First he complains of lower back calf and thigh pain bilaterally.  This is been going on for about 6 months.  He feels it starts in the calves of his legs.  However it radiates up and down his leg and into his back.  He also complains of numbness in his feet.  Associated with this he notices some cramping.  He has tried treating this with Gatorade and drinking more water. His other issue is of left shoulder pain.  He has decreased motion and has difficulty raising his arm above his shoulder and bringing his arm out to the side.  He denies any injuries.  Assessment & Plan: Visit Diagnoses:  1. Chronic bilateral low back pain with bilateral sciatica   2. Chronic left shoulder pain     Plan: Recommend he start drinking some coconut water to hopefully help some of the cramping.  Cramping could also be caused by some of his back issues.  He will go on a course of oral prednisone daily with breakfast for the next 3 weeks.  May also just take it with food as he does not usually eat breakfast.  With regards to his shoulder I would like for him to go forward with a steroid injection if he is amenable to this today.  He would.  If he does not have improvements in the shoulder and/or the lumbar spine consideration could be given for MRI  Follow-Up Instructions: No follow-ups on file.   Ortho Exam  Patient is alert, oriented, no adenopathy, well-dressed, normal affect, normal respiratory effort. Left shoulder no obvious abnormality.  Clinically.   He has difficulty with forward Elevation.  He has internal rotation to about his beltline.  He does have positive impingement findings. Examination of his lumbar spine no clinical abnormality.  He is no specific tenderness today.  His calves are soft and nontender negative Homans' sign bilaterally.  Good strength with dorsiflexion plantarflexion he does have a positive straight leg raise right greater than left that reproduces his uncomfortable symptoms  Imaging: XR Lumbar Spine 2-3 Views  Result Date: 02/04/2021 2 views of the lumbar spine demonstrate overall well-maintained alignment..  No acute osseous injuries.  He does have some narrowing at the L1-2 and L3-4 level.  XR Shoulder Left  Result Date: 02/04/2021 2 views of his shoulder demonstrate no acute osseous injuries humeral head is well reduced in the glenoid overall well maintained alignment no elevation of the humeral head  No images are attached to the encounter.  Labs: Lab Results  Component Value Date   HGBA1C 6.2 (H) 11/25/2020     Lab Results  Component Value Date   ALBUMIN 4.5 11/25/2020    No results found for: MG No results found for: VD25OH  No results found for: PREALBUMIN CBC EXTENDED Latest Ref Rng & Units 11/25/2020  WBC 3.4 - 10.8  x10E3/uL 6.2  RBC 4.14 - 5.80 x10E6/uL 4.67  HGB 13.0 - 17.7 g/dL 15.7  HCT 37.5 - 51.0 % 44.8  PLT 150 - 450 x10E3/uL 258  NEUTROABS 1.4 - 7.0 x10E3/uL 3.6  LYMPHSABS 0.7 - 3.1 x10E3/uL 2.0     There is no height or weight on file to calculate BMI.  Orders:  Orders Placed This Encounter  Procedures   XR Shoulder Left   XR Lumbar Spine 2-3 Views   No orders of the defined types were placed in this encounter.    Procedures: No procedures performed  Clinical Data: No additional findings.  ROS:  All other systems negative, except as noted in the HPI. Review of Systems  Objective: Vital Signs: There were no vitals taken for this visit.  Specialty Comments:   No specialty comments available.  PMFS History: Patient Active Problem List   Diagnosis Date Noted   Muscle cramps 12/17/2020   Pre-diabetes 12/17/2020   Elevated lipids 12/17/2020   Encounter to establish care 11/25/2020   Essential hypertension 11/25/2020   History of asthma 11/25/2020   Anxiety and depression 11/25/2020   History of insomnia 11/25/2020   History of gastroesophageal reflux (GERD) 11/25/2020   Smoking 11/25/2020   Past Medical History:  Diagnosis Date   Anxiety    Asthma    Depression    GERD (gastroesophageal reflux disease)    Hypertension    Pre-diabetes    Sleep apnea     Family History  Problem Relation Age of Onset   Other Mother        unknown medical history   Other Father        unknwon medical history   Hypertension Maternal Grandmother    Heart disease Maternal Grandfather    Other Cousin        kidney transplant    Past Surgical History:  Procedure Laterality Date   MANDIBLE FRACTURE SURGERY     Social History   Occupational History   Not on file  Tobacco Use   Smoking status: Every Day    Packs/day: 1.00    Years: 34.00    Pack years: 34.00    Types: Cigarettes   Smokeless tobacco: Former    Quit date: 2012  Vaping Use   Vaping Use: Never used  Substance and Sexual Activity   Alcohol use: Yes    Alcohol/week: 2.0 standard drinks    Types: 2 Cans of beer per week    Comment: 2 40oz beers at night to sleep   Drug use: Not Currently    Types: Cocaine, Marijuana, LSD    Comment: used in his 20's, has not used in 30 years   Sexual activity: Not Currently

## 2021-02-05 ENCOUNTER — Other Ambulatory Visit: Payer: Self-pay

## 2021-02-08 ENCOUNTER — Other Ambulatory Visit: Payer: Self-pay

## 2021-02-09 ENCOUNTER — Ambulatory Visit: Payer: Medicaid Other | Admitting: Licensed Clinical Social Worker

## 2021-02-09 ENCOUNTER — Other Ambulatory Visit: Payer: Self-pay

## 2021-02-09 ENCOUNTER — Ambulatory Visit: Payer: Self-pay | Admitting: Licensed Clinical Social Worker

## 2021-02-09 DIAGNOSIS — G479 Sleep disorder, unspecified: Secondary | ICD-10-CM

## 2021-02-09 NOTE — BH Specialist Note (Signed)
Integrated Behavioral Health Follow Up In-Person Visit  MRN: [id number] Name: Jason Livingston  Total time: 60 minutes  Types of Service: Telephone visit Patient consents to telephone visit and 2 patient identifiers were used to identify patient.   Interpretor:No. Interpretor Name and Language: N/A  Subjective: Jason Livingston is a 55 y.o. male accompanied by  herself Patient was referred by Jason Shadow, NP  for mental health. Patient reports the following symptoms/concerns: The patient stated he has been doing okay since his last follow-up session. The patient noted that he went to his orthopedic appointment and was prescribed prednisone. He stated he knows that a side effect of the medication is it can cause insomnia and is concerned this may make his insomnia worse. He noted he take the prednisone in the morning and his first does was on Sunday. He noted he is experiencing the same level of difficulty falling asleep and staying asleep. He explained that he just picked up his prescription for Mirtazapine and plans to begin that tonight at bedtime.  He discussed that in the past he found himself going in the same circle over and over. He stated, "I would get a job, get a home, meet a girl, everything is great. Then I'd slack off, lose the job, lose the home, and lose the girl. Then I would start the cycle all over again." He noted when he realized this pattern he turned to God and decided to let God handle everything since he couldn't do it alone. He notes he tends to make great plans but they always backfire. However, he explained his biggest strength is, " I have the ability to plod on and not give up." He described that he never has had a problem with becoming stressed out. He stated he has never had a problem with depression or anxiety. He endorsed having issues with anger that began when he was in his 20's. He stated that he overcame a lot of that when he gave it to God. Jason Livingston described period  of his life where he was homeless and the struggles he faced in his life. He noted that he use to draw and has thought about trying that again. He stated that he enjoyed pottery in [employer]. Currently he notes he likes to play guitar and Chiropractor. The patient denied any suicidal or homicidal thoughts.  Duration of problem: Years ; Severity of problem: moderate  Objective: Mood: Euthymic and Affect: Appropriate Risk of harm to self or others: No plan to harm self or others  Life Context: Family and Social: see above  School/Work: see above Self-Care: see above Life Changes: see above  Patient and/or Family's Strengths/Protective Factors: Sense of purpose  Goals Addressed: Patient will:  Reduce symptoms of: insomnia and stress   Increase knowledge and/or ability of: coping skills, healthy habits, self-management skills, and stress reduction   Demonstrate ability to: Increase healthy adjustment to current life circumstances and Increase adequate support systems for patient/family  Progress towards Goals: Ongoing  Interventions: Interventions utilized:  CBT Cognitive Behavioral Therapy was utilized by the clinician during today's follow up session. The clinician processed with the patient how they have been doing since the last follow-up session. The clinician provided a space for the patient to ventilate their frustrations regarding their current life circumstances. Clinician measured the patient's anxiety and depression on a numerical scale. Clinician utilized reflective listening, empathy, and support to build trust and rapport with the patient. Clinician reviewed confidentiality and its limitations. Clinician explored  with the patient his feelings regarding psychotropic medications and provided Psycho-education regarding CBT-I. Clinician encouraged the patient to practice a quiet activity before bed and avoid stimulants such as smoking or caffeine.  Standardized Assessments  completed: GAD-7 and PHQ 9 PHQ-9        11 GAD-7         2           Assessment: Patient currently experiencing see above.   Patient may benefit from see above.  Plan: Follow up with behavioral health clinician on : 02/25/2021 Behavioral recommendations:  Referral(s): [facility] (In Clinic) "From scale of 1-10, how likely are you to follow plan?":   Lesli Albee, LCSWA

## 2021-02-10 ENCOUNTER — Other Ambulatory Visit: Payer: Self-pay | Admitting: Gerontology

## 2021-02-10 ENCOUNTER — Other Ambulatory Visit: Payer: Self-pay

## 2021-02-10 DIAGNOSIS — Z8709 Personal history of other diseases of the respiratory system: Secondary | ICD-10-CM

## 2021-02-10 MED ORDER — BUDESONIDE-FORMOTEROL FUMARATE 160-4.5 MCG/ACT IN AERO
2.0000 | INHALATION_SPRAY | Freq: Two times a day (BID) | RESPIRATORY_TRACT | 4 refills | Status: DC
  Filled 2021-02-10: qty 10.2, 30d supply, fill #0

## 2021-02-11 ENCOUNTER — Telehealth: Payer: Self-pay | Admitting: Pharmacist

## 2021-02-11 NOTE — Telephone Encounter (Signed)
02/11/2021 8:15:12 AM - Memory Dance Ellipta 100/25 to pt & dr  -- Elmer Picker - Thursday, February 11, 2021 8:13 AM --Received a pharmacy printout for Breo Ellipta 100/58mcg  Inhale 1 puff into the lungs once daily, printed script for Benjamine Mola to sign, and [employer] application-mailing to patient to sign and return.

## 2021-02-16 ENCOUNTER — Telehealth: Payer: Self-pay | Admitting: Pharmacist

## 2021-02-16 NOTE — Telephone Encounter (Signed)
02/16/2021 1:31:11 PM - Memory Dance Ellipta pending pt portion -- Elmer Picker - Tuesday, February 16, 2021 1:30 PM -- I have received the signed script back from provider, holding for patient to pick up meds and sign his portion of application to order Breo Ellipta.

## 2021-02-18 ENCOUNTER — Other Ambulatory Visit: Payer: Self-pay

## 2021-02-18 ENCOUNTER — Ambulatory Visit: Payer: Medicaid Other | Admitting: Gerontology

## 2021-02-18 ENCOUNTER — Encounter: Payer: Self-pay | Admitting: Gerontology

## 2021-02-18 VITALS — BP 134/92 | HR 96 | Temp 97.3°F | Ht 73.0 in | Wt 200.3 lb

## 2021-02-18 DIAGNOSIS — I10 Essential (primary) hypertension: Secondary | ICD-10-CM

## 2021-02-18 DIAGNOSIS — R252 Cramp and spasm: Secondary | ICD-10-CM

## 2021-02-18 DIAGNOSIS — E785 Hyperlipidemia, unspecified: Secondary | ICD-10-CM

## 2021-02-18 DIAGNOSIS — Z8709 Personal history of other diseases of the respiratory system: Secondary | ICD-10-CM

## 2021-02-18 DIAGNOSIS — Z8719 Personal history of other diseases of the digestive system: Secondary | ICD-10-CM

## 2021-02-18 DIAGNOSIS — Z0000 Encounter for general adult medical examination without abnormal findings: Secondary | ICD-10-CM

## 2021-02-18 DIAGNOSIS — R7303 Prediabetes: Secondary | ICD-10-CM

## 2021-02-18 MED ORDER — ALBUTEROL SULFATE HFA 108 (90 BASE) MCG/ACT IN AERS
2.0000 | INHALATION_SPRAY | Freq: Four times a day (QID) | RESPIRATORY_TRACT | 2 refills | Status: DC | PRN
  Filled 2021-02-18: qty 6.7, 25d supply, fill #0

## 2021-02-18 MED ORDER — METFORMIN HCL ER 500 MG PO TB24
500.0000 mg | ORAL_TABLET | Freq: Every day | ORAL | 2 refills | Status: DC
  Filled 2021-02-18: qty 30, 30d supply, fill #0
  Filled 2021-04-22: qty 30, 30d supply, fill #1
  Filled 2021-09-06: qty 30, 30d supply, fill #2

## 2021-02-18 MED ORDER — LOSARTAN POTASSIUM 25 MG PO TABS
25.0000 mg | ORAL_TABLET | Freq: Every day | ORAL | 3 refills | Status: DC
  Filled 2021-02-23 – 2021-04-22 (×2): qty 30, 30d supply, fill #0
  Filled 2021-06-22: qty 30, 30d supply, fill #1
  Filled 2021-09-06: qty 30, 30d supply, fill #2

## 2021-02-18 MED ORDER — OMEPRAZOLE 20 MG PO CPDR
20.0000 mg | DELAYED_RELEASE_CAPSULE | Freq: Every day | ORAL | 2 refills | Status: DC
  Filled 2021-02-18: qty 30, 30d supply, fill #0
  Filled 2021-04-22: qty 30, 30d supply, fill #1
  Filled 2021-06-22: qty 30, 30d supply, fill #2

## 2021-02-18 MED ORDER — ROSUVASTATIN CALCIUM 5 MG PO TABS
5.0000 mg | ORAL_TABLET | Freq: Every day | ORAL | 2 refills | Status: DC
  Filled 2021-02-18: qty 30, 30d supply, fill #0
  Filled 2021-03-17 – 2021-03-26 (×2): qty 30, 30d supply, fill #1

## 2021-02-18 NOTE — Patient Instructions (Signed)
https://www.nhlbi.nih.gov/files/docs/public/heart/dash_brief.pdf">  DASH Eating Plan DASH stands for Dietary Approaches to Stop Hypertension. The DASH eating plan is a healthy eating plan that has been shown to: Reduce high blood pressure (hypertension). Reduce your risk for type 2 diabetes, heart disease, and stroke. Help with weight loss. What are tips for following this plan? Reading food labels Check food labels for the amount of salt (sodium) per serving. Choose foods with less than 5 percent of the Daily Value of sodium. Generally, foods with less than 300 milligrams (mg) of sodium per serving fit into this eating plan. To find whole grains, look for the word "whole" as the first word in the ingredient list. Shopping Buy products labeled as "low-sodium" or "no salt added." Buy fresh foods. Avoid canned foods and pre-made or frozen meals. Cooking Avoid adding salt when cooking. Use salt-free seasonings or herbs instead of table salt or sea salt. Check with your health care provider or pharmacist before using salt substitutes. Do not fry foods. Cook foods using healthy methods such as baking, boiling, grilling, roasting, and broiling instead. Cook with heart-healthy oils, such as olive, canola, avocado, soybean, or sunflower oil. Meal planning  Eat a balanced diet that includes: 4 or more servings of fruits and 4 or more servings of vegetables each day. Try to fill one-half of your plate with fruits and vegetables. 6-8 servings of whole grains each day. Less than 6 oz (170 g) of lean meat, poultry, or fish each day. A 3-oz (85-g) serving of meat is about the same size as a deck of cards. One egg equals 1 oz (28 g). 2-3 servings of low-fat dairy each day. One serving is 1 cup (237 mL). 1 serving of nuts, seeds, or beans 5 times each week. 2-3 servings of heart-healthy fats. Healthy fats called omega-3 fatty acids are found in foods such as walnuts, flaxseeds, fortified milks, and eggs.  These fats are also found in cold-water fish, such as sardines, salmon, and mackerel. Limit how much you eat of: Canned or prepackaged foods. Food that is high in trans fat, such as some fried foods. Food that is high in saturated fat, such as fatty meat. Desserts and other sweets, sugary drinks, and other foods with added sugar. Full-fat dairy products. Do not salt foods before eating. Do not eat more than 4 egg yolks a week. Try to eat at least 2 vegetarian meals a week. Eat more home-cooked food and less restaurant, buffet, and fast food.  Lifestyle When eating at a restaurant, ask that your food be prepared with less salt or no salt, if possible. If you drink alcohol: Limit how much you use to: 0-1 drink a day for women who are not pregnant. 0-2 drinks a day for men. Be aware of how much alcohol is in your drink. In the [place] one drink equals one 12 oz bottle of beer (355 mL), one 5 oz glass of wine (148 mL), or one 1 oz glass of hard liquor (44 mL). General information Avoid eating more than 2,300 mg of salt a day. If you have hypertension, you may need to reduce your sodium intake to 1,500 mg a day. Work with your health care provider to maintain a healthy body weight or to lose weight. Ask what an ideal weight is for you. Get at least 30 minutes of exercise that causes your heart to beat faster (aerobic exercise) most days of the week. Activities may include walking, swimming, or biking. Work with your health care provider   or dietitian to adjust your eating plan to your individual calorie needs. What foods should I eat? Fruits All fresh, dried, or frozen fruit. Canned fruit in natural juice (without addedsugar). Vegetables Fresh or frozen vegetables (raw, steamed, roasted, or grilled). Low-sodium or reduced-sodium tomato and vegetable juice. Low-sodium or reduced-sodium tomatosauce and tomato paste. Low-sodium or reduced-sodium canned vegetables. Grains Whole-grain or  whole-wheat bread. Whole-grain or whole-wheat pasta. Brown rice. Oatmeal. Quinoa. Bulgur. Whole-grain and low-sodium cereals. Pita bread.Low-fat, low-sodium crackers. Whole-wheat flour tortillas. Meats and other proteins Skinless chicken or turkey. Ground chicken or turkey. Pork with fat trimmed off. Fish and seafood. Egg whites. Dried beans, peas, or lentils. Unsalted nuts, nut butters, and seeds. Unsalted canned beans. Lean cuts of beef with fat trimmed off. Low-sodium, lean precooked or cured meat, such as sausages or meatloaves. Dairy Low-fat (1%) or fat-free (skim) milk. Reduced-fat, low-fat, or fat-free cheeses. Nonfat, low-sodium ricotta or cottage cheese. Low-fat or nonfatyogurt. Low-fat, low-sodium cheese. Fats and oils Soft margarine without trans fats. Vegetable oil. Reduced-fat, low-fat, or light mayonnaise and salad dressings (reduced-sodium). Canola, safflower, olive, avocado, soybean, andsunflower oils. Avocado. Seasonings and condiments Herbs. Spices. Seasoning mixes without salt. Other foods Unsalted popcorn and pretzels. Fat-free sweets. The items listed above may not be a complete list of foods and beverages you can eat. Contact a dietitian for more information. What foods should I avoid? Fruits Canned fruit in a light or heavy syrup. Fried fruit. Fruit in cream or buttersauce. Vegetables Creamed or fried vegetables. Vegetables in a cheese sauce. Regular canned vegetables (not low-sodium or reduced-sodium). Regular canned tomato sauce and paste (not low-sodium or reduced-sodium). Regular tomato and vegetable juice(not low-sodium or reduced-sodium). Pickles. Olives. Grains Baked goods made with fat, such as croissants, muffins, or some breads. Drypasta or rice meal packs. Meats and other proteins Fatty cuts of meat. Ribs. Fried meat. Bacon. Bologna, salami, and other precooked or cured meats, such as sausages or meat loaves. Fat from the back of a pig (fatback). Bratwurst.  Salted nuts and seeds. Canned beans with added salt. Canned orsmoked fish. Whole eggs or egg yolks. Chicken or turkey with skin. Dairy Whole or 2% milk, cream, and half-and-half. Whole or full-fat cream cheese. Whole-fat or sweetened yogurt. Full-fat cheese. Nondairy creamers. Whippedtoppings. Processed cheese and cheese spreads. Fats and oils Butter. Stick margarine. Lard. Shortening. Ghee. Bacon fat. Tropical oils, suchas coconut, palm kernel, or palm oil. Seasonings and condiments Onion salt, garlic salt, seasoned salt, table salt, and sea salt. Worcestershire sauce. Tartar sauce. Barbecue sauce. Teriyaki sauce. Soy sauce, including reduced-sodium. Steak sauce. Canned and packaged gravies. Fish sauce. Oyster sauce. Cocktail sauce. Store-bought horseradish. Ketchup. Mustard. Meat flavorings and tenderizers. Bouillon cubes. Hot sauces. Pre-made or packaged marinades. Pre-made or packaged taco seasonings. Relishes. Regular saladdressings. Other foods Salted popcorn and pretzels. The items listed above may not be a complete list of foods and beverages you should avoid. Contact a dietitian for more information. Where to find more information National Heart, Lung, and Blood Institute: www.nhlbi.nih.gov American Heart Association: www.heart.org Academy of Nutrition and Dietetics: www.eatright.org National Kidney Foundation: www.kidney.org Summary The DASH eating plan is a healthy eating plan that has been shown to reduce high blood pressure (hypertension). It may also reduce your risk for type 2 diabetes, heart disease, and stroke. When on the DASH eating plan, aim to eat more fresh fruits and vegetables, whole grains, lean proteins, low-fat dairy, and heart-healthy fats. With the DASH eating plan, you should limit salt (sodium) intake to 2,300   mg a day. If you have hypertension, you may need to reduce your sodium intake to 1,500 mg a day. Work with your health care provider or dietitian to adjust  your eating plan to your individual calorie needs. This information is not intended to replace advice given to you by your health care provider. Make sure you discuss any questions you have with your healthcare provider. Document Revised: 07/05/2019 Document Reviewed: 07/05/2019 Elsevier Patient Education  2022 [place] Heart-healthy meal planning includes: Eating less unhealthy fats. Eating more healthy fats. Making other changes in your diet. Talk with your doctor or a diet specialist (dietitian) to create an eating plan that is right for you. What is my plan? Your doctor may recommend an eating plan that includes: Total fat: ______% or less of total calories a day. Saturated fat: ______% or less of total calories a day. Cholesterol: less than _________mg a day. What are tips for following this plan? Cooking Avoid frying your food. Try to bake, boil, grill, or broil it instead. You can also reduce fat by: Removing the skin from poultry. Removing all visible fats from meats. Steaming vegetables in water or broth. Meal planning  At meals, divide your plate into four equal parts: Fill one-half of your plate with vegetables and green salads. Fill one-fourth of your plate with whole grains. Fill one-fourth of your plate with lean protein foods. Eat 4-5 servings of vegetables per day. A serving of vegetables is: 1 cup of raw or cooked vegetables. 2 cups of raw leafy greens. Eat 4-5 servings of fruit per day. A serving of fruit is: 1 medium whole fruit.  cup of dried fruit.  cup of fresh, frozen, or canned fruit.  cup of 100% fruit juice. Eat more foods that have soluble fiber. These are apples, broccoli, carrots, beans, peas, and barley. Try to get 20-30 g of fiber per day. Eat 4-5 servings of nuts, legumes, and seeds per week: 1 serving of dried beans or legumes equals  cup after being cooked. 1 serving of nuts is  cup. 1 serving of seeds  equals 1 tablespoon.  General information Eat more home-cooked food. Eat less restaurant, buffet, and fast food. Limit or avoid alcohol. Limit foods that are high in starch and sugar. Avoid fried foods. Lose weight if you are overweight. Keep track of how much salt (sodium) you eat. This is important if you have high blood pressure. Ask your doctor to tell you more about this. Try to add vegetarian meals each week. Fats Choose healthy fats. These include olive oil and canola oil, flaxseeds, walnuts, almonds, and seeds. Eat more omega-3 fats. These include salmon, mackerel, sardines, tuna, flaxseed oil, and ground flaxseeds. Try to eat fish at least 2 times each week. Check food labels. Avoid foods with trans fats or high amounts of saturated fat. Limit saturated fats. These are often found in animal products, such as meats, butter, and cream. These are also found in plant foods, such as palm oil, palm kernel oil, and coconut oil. Avoid foods with partially hydrogenated oils in them. These have trans fats. Examples are stick margarine, some tub margarines, cookies, crackers, and other baked goods. What foods can I eat? Fruits All fresh, canned (in natural juice), or frozen fruits. Vegetables Fresh or frozen vegetables (raw, steamed, roasted, or grilled). Green salads. Grains Most grains. Choose whole wheat and whole grains most of the time. Rice andpasta, including brown rice and pastas made with whole wheat. Meats  and other proteins Lean, well-trimmed beef, veal, pork, and lamb. Chicken and [place] without skin. All fish and shellfish. Wild duck, rabbit, pheasant, and venison. Egg whites or low-cholesterol egg substitutes. Dried beans, peas, lentils, and tofu. Seedsand most nuts. Dairy Low-fat or nonfat cheeses, including ricotta and mozzarella. Skim or 1% milk that is liquid, powdered, or evaporated. Buttermilk that is made with low-fatmilk. Nonfat or low-fat yogurt. Fats and  oils Non-hydrogenated (trans-free) margarines. Vegetable oils, including soybean, sesame, sunflower, olive, peanut, safflower, corn, canola, and cottonseed. Salad dressings or mayonnaisemade with a vegetable oil. Beverages Mineral water. Coffee and tea. Diet carbonated beverages. Sweets and desserts Sherbet, gelatin, and fruit ice. Small amounts of dark chocolate. Limit all sweets and desserts. Seasonings and condiments All seasonings and condiments. The items listed above may not be a complete list of foods and drinks you can eat. Contact a dietitian for more options. What foods should I avoid? Fruits Canned fruit in heavy syrup. Fruit in cream or butter sauce. Fried fruit. Limitcoconut. Vegetables Vegetables cooked in cheese, cream, or butter sauce. Fried vegetables. Grains Breads that are made with saturated or trans fats, oils, or whole milk. Croissants. Sweet rolls. Donuts. High-fat crackers,such as cheese crackers. Meats and other proteins Fatty meats, such as hot dogs, ribs, sausage, bacon, rib-eye roast or steak. High-fat deli meats, such as salami and bologna. Caviar. Domestic duck andgoose. Organ meats, such as liver. Dairy Cream, sour cream, cream cheese, and creamed cottage cheese. Whole-milk cheeses. Whole or 2% milk that is liquid, evaporated, or condensed. Whole buttermilk. Cream sauce or high-fat cheese sauce. Yogurt that is made fromwhole milk. Fats and oils Meat fat, or shortening. Cocoa butter, hydrogenated oils, palm oil, coconut oil, palm kernel oil. Solid fats and shortenings, including bacon fat, salt pork, lard, and butter. Nondairy cream substitutes. Salad dressings with cheeseor sour cream. Beverages Regular sodas and juice drinks with added sugar. Sweets and desserts Frosting. Pudding. Cookies. Cakes. Pies. Milk chocolate or white chocolate.Buttered syrups. Full-fat ice cream or ice cream drinks. The items listed above may not be a complete list of foods and  drinks to avoid. Contact a dietitian for more information. Summary Heart-healthy meal planning includes eating less unhealthy fats, eating more healthy fats, and making other changes in your diet. Eat a balanced diet. This includes fruits and vegetables, low-fat or nonfat dairy, lean protein, nuts and legumes, whole grains, and heart-healthy oils and fats. This information is not intended to replace advice given to you by your health care provider. Make sure you discuss any questions you have with your healthcare provider. Document Revised: 10/05/2017 Document Reviewed: 09/08/2017 Elsevier Patient Education  2022 [employer].

## 2021-02-18 NOTE — Progress Notes (Signed)
Established Patient Office Visit  Subjective:  Patient ID: Jason Livingston, male    DOB: [birth year]  Age: 55 y.o. MRN: [id number]  CC:  Chief Complaint  Patient presents with   Follow-up    HPI Roshan Roback  is a 55 y/o male who has history of Prediabetes, Hypertension, Asthma, Anxiety, Depression, GERD presents for routine follow up. His HgbA1c was 6.2%, he resumed taking  500 mg Metformin with food and he denies any side effects. He was seen by the Orthopedic Person M.A PA-C on 02/04/21 for his chronic back and shoulder pain and was started on Prednisone. He states that he self discontinued his Prednisone because he was cramping after he started taking prednisone. He states that the tightening of his lower legs decreased since stopping the prednisone. He also states that drinking coconut water helps relieve his muscle cramps. He denies bowel/ bladder incontinence and saddle anesthesia. He states that his [facility] care is active and he will schedule an appointment with Gastroenterology for Colonoscopy screening. He also states that taking Omeprazole relieves his acid reflux. Overall, he states that he's doing well and offers no further complaint.   Past Medical History:  Diagnosis Date   Anxiety    Asthma    Depression    GERD (gastroesophageal reflux disease)    Hypertension    Pre-diabetes    Sleep apnea     Past Surgical History:  Procedure Laterality Date   MANDIBLE FRACTURE SURGERY      Family History  Problem Relation Age of Onset   Other Mother        unknown medical history   Other Father        unknwon medical history   Hypertension Maternal Grandmother    Heart disease Maternal Grandfather    Other Cousin        kidney transplant    Social History   Socioeconomic History   Marital status: Single    Spouse name: Not on file   Number of children: Not on file   Years of education: Not on file   Highest education level: Not on file  Occupational  History   Not on file  Tobacco Use   Smoking status: Every Day    Packs/day: 1.00    Years: 34.00    Pack years: 34.00    Types: Cigarettes   Smokeless tobacco: Former    Quit date: 2012  Vaping Use   Vaping Use: Never used  Substance and Sexual Activity   Alcohol use: Yes    Alcohol/week: 14.0 standard drinks    Types: 14 Cans of beer per week    Comment: 2 40oz beers at night to sleep, 02/18/21 20oz with dinner everynight   Drug use: Not Currently    Types: Cocaine, Marijuana, LSD    Comment: used in his 20's, has not used in 30 years   Sexual activity: Not Currently  Other Topics Concern   Not on file  Social History Narrative   Not on file   Social Determinants of Health   Financial Resource Strain: Not on file  Food Insecurity: No Food Insecurity   Worried About Charity fundraiser in the Last Year: Never true   Ran Out of Food in the Last Year: Never true  Transportation Needs: Unmet Transportation Needs   Lack of Transportation (Medical): Yes   Lack of Transportation (Non-Medical): Yes  Physical Activity: Not on file  Stress: Not on file  Social Connections: Not  on file  Intimate Partner Violence: Not on file    Outpatient Medications Prior to Visit  Medication Sig Dispense Refill   diclofenac Sodium (VOLTAREN) 1 % GEL Apply 2 g topically 4 (four) times daily. 2 g 0   fluticasone furoate-vilanterol (BREO ELLIPTA) 100-25 MCG/INH AEPB Inhale 1 puff into the lungs once daily. 60 each 5   Melatonin 3 MG CAPS Take 2 capsules (6 mg total) by mouth at bedtime. 30 capsule 0   albuterol (VENTOLIN HFA) 108 (90 Base) MCG/ACT inhaler Inhale 2 puffs into the lungs every 6 (six) hours as needed for wheezing or shortness of breath. 6.7 g 2   losartan (COZAAR) 25 MG tablet Take 1 tablet (25 mg total) by mouth daily. 30 tablet 3   omeprazole (PRILOSEC) 20 MG capsule Take 1 capsule (20 mg total) by mouth daily. 30 capsule 2   rosuvastatin (CRESTOR) 5 MG tablet Take 1 tablet (5 mg  total) by mouth daily. 30 tablet 2   budesonide-formoterol (SYMBICORT) 160-4.5 MCG/ACT inhaler Inhale 2 puffs into the lungs 2 (two) times daily. (Patient not taking: Reported on 02/18/2021) 10.2 g 4   predniSONE (DELTASONE) 10 MG tablet Take 2 tablets (20 mg total) by mouth daily with breakfast. (Patient not taking: Reported on 02/18/2021) 60 tablet 0   No facility-administered medications prior to visit.    No Known Allergies  ROS Review of Systems  Constitutional: Negative.   Respiratory: Negative.    Cardiovascular: Negative.   Musculoskeletal:  Positive for arthralgias (left shoulder pain) and back pain (chronic back pain).  Skin: Negative.   Neurological: Negative.   Psychiatric/Behavioral: Negative.       Objective:    Physical Exam HENT:     Head: Normocephalic and atraumatic.     Mouth/Throat:     Mouth: Mucous membranes are moist.  Eyes:     Extraocular Movements: Extraocular movements intact.     Conjunctiva/sclera: Conjunctivae normal.     Pupils: Pupils are equal, round, and reactive to light.  Cardiovascular:     Rate and Rhythm: Normal rate and regular rhythm.     Pulses: Normal pulses.     Heart sounds: Normal heart sounds.  Pulmonary:     Effort: Pulmonary effort is normal.     Breath sounds: Normal breath sounds.  Skin:    General: Skin is warm.  Neurological:     General: No focal deficit present.     Mental Status: He is alert and oriented to person, place, and time. Mental status is at baseline.  Psychiatric:        Mood and Affect: Mood normal.        Behavior: Behavior normal.        Thought Content: Thought content normal.        Judgment: Judgment normal.    BP (!) 134/92 (BP Location: Right Arm, Patient Position: Sitting, Cuff Size: Large)   Pulse 96   Temp (!) 97.3 F (36.3 C)   Ht 6' 1"  (1.854 m)   Wt 200 lb 4.8 oz (90.9 kg)   SpO2 93%   BMI 26.43 kg/m  Wt Readings from Last 3 Encounters:  02/18/21 200 lb 4.8 oz (90.9 kg)  01/21/21  205 lb 4.8 oz (93.1 kg)  12/17/20 209 lb 9.6 oz (95.1 kg)     Health Maintenance Due  Topic Date Due   COVID-19 Vaccine (1) Never done   Pneumococcal Vaccine 20-35 Years old (1 - PCV) Never done  HIV Screening  Never done   Hepatitis C Screening  Never done   TETANUS/TDAP  Never done   Zoster Vaccines- Shingrix (1 of 2) Never done   COLONOSCOPY (Pts 45-48yr Insurance coverage will need to be confirmed)  Never done    There are no preventive care reminders to display for this patient.  No results found for: TSH Lab Results  Component Value Date   WBC 6.2 11/25/2020   HGB 15.7 11/25/2020   HCT 44.8 11/25/2020   MCV 96 11/25/2020   PLT 258 11/25/2020   Lab Results  Component Value Date   NA 144 11/25/2020   K 4.8 11/25/2020   CO2 22 11/25/2020   GLUCOSE 111 (H) 11/25/2020   BUN 14 11/25/2020   CREATININE 1.00 11/25/2020   BILITOT 0.2 11/25/2020   ALKPHOS 70 11/25/2020   AST 15 11/25/2020   ALT 15 11/25/2020   PROT 7.1 11/25/2020   ALBUMIN 4.5 11/25/2020   CALCIUM 9.3 11/25/2020   EGFR 91 11/25/2020   Lab Results  Component Value Date   CHOL 232 (H) 11/25/2020   Lab Results  Component Value Date   HDL 49 11/25/2020   Lab Results  Component Value Date   LDLCALC 141 (H) 11/25/2020   Lab Results  Component Value Date   TRIG 234 (H) 11/25/2020   Lab Results  Component Value Date   CHOLHDL 4.7 11/25/2020   Lab Results  Component Value Date   HGBA1C 6.2 (H) 11/25/2020      Assessment & Plan:      1. Essential hypertension -His blood pressure is under control, he will continue on current medication, DASH diet and exercise as tolerated. - losartan (COZAAR) 25 MG tablet; Take 1 tablet (25 mg total) by mouth once daily.  Dispense: 30 tablet; Refill: 3  2. Elevated lipids -He will continue on current medication, low fat/cholesterol diet and exercise as tolerated. - rosuvastatin (CRESTOR) 5 MG tablet; Take 1 tablet (5 mg total) by mouth once daily.   Dispense: 30 tablet; Refill: 2 - Lipid panel; Future  3. History of gastroesophageal reflux (GERD) -His acid reflux is under control and he will continue on current medication -Avoid spicy, fatty and fried food -Avoid sodas and sour juices -Avoid heavy meals -Avoid eating 4 hours before bedtime -Elevate head of bed at night - omeprazole (PRILOSEC) 20 MG capsule; Take 1 capsule (20 mg total) by mouth once daily.  Dispense: 30 capsule; Refill: 2  4. History of asthma - His breathing is stable and he will continue on current medication - albuterol (VENTOLIN HFA) 108 (90 Base) MCG/ACT inhaler; Inhale 2 puffs into the lungs every 6 (six) hours as needed for wheezing or shortness of breath.  Dispense: 6.7 g; Refill: 2  5. Pre-diabetes - He will continue on current medication, low carb/non concentrated sweet diet and exercise as tolerated. - metFORMIN (GLUCOPHAGE-XR) 500 MG 24 hr tablet; Take 1 tablet (500 mg total) by mouth daily with breakfast.  Dispense: 30 tablet; Refill: 2 - HgB A1c; Future  6. Muscle cramps  - Will check CK (Creatine Kinase) to rule out Statin side effect.  7. Health care maintenance - [facility], he will schedule Colonoscopy screening. - Ambulatory referral to Gastroenterology  Follow-up: Return in about 7 weeks (around 04/08/2021), or if symptoms worsen or fail to improve.    Margaux Engen EJerold Coombe NP

## 2021-02-19 ENCOUNTER — Other Ambulatory Visit: Payer: Self-pay

## 2021-02-22 ENCOUNTER — Other Ambulatory Visit: Payer: Self-pay

## 2021-02-23 ENCOUNTER — Other Ambulatory Visit: Payer: Self-pay

## 2021-02-23 ENCOUNTER — Other Ambulatory Visit: Payer: Self-pay | Admitting: Gerontology

## 2021-02-23 DIAGNOSIS — Z8709 Personal history of other diseases of the respiratory system: Secondary | ICD-10-CM

## 2021-02-23 MED ORDER — BUDESONIDE-FORMOTEROL FUMARATE 160-4.5 MCG/ACT IN AERO
2.0000 | INHALATION_SPRAY | Freq: Two times a day (BID) | RESPIRATORY_TRACT | 4 refills | Status: DC
  Filled 2021-02-23: qty 10.2, fill #0

## 2021-02-23 MED ORDER — GOLYTELY 236 G PO SOLR
4000.0000 mL | Freq: Once | ORAL | 0 refills | Status: AC
  Filled 2021-02-23: qty 4000, 1d supply, fill #0

## 2021-02-23 MED ORDER — FLUTICASONE FUROATE-VILANTEROL 200-25 MCG/INH IN AEPB
1.0000 | INHALATION_SPRAY | Freq: Every day | RESPIRATORY_TRACT | 1 refills | Status: DC
  Filled 2021-02-23 – 2021-02-24 (×2): qty 60, 30d supply, fill #0
  Filled 2021-03-26: qty 60, 30d supply, fill #1

## 2021-02-24 ENCOUNTER — Other Ambulatory Visit: Payer: Self-pay

## 2021-02-25 ENCOUNTER — Other Ambulatory Visit: Payer: Self-pay

## 2021-02-25 ENCOUNTER — Ambulatory Visit: Payer: Self-pay | Admitting: Licensed Clinical Social Worker

## 2021-02-25 ENCOUNTER — Ambulatory Visit: Payer: Medicaid Other | Admitting: Licensed Clinical Social Worker

## 2021-02-25 DIAGNOSIS — G479 Sleep disorder, unspecified: Secondary | ICD-10-CM

## 2021-02-25 NOTE — BH Specialist Note (Signed)
Integrated Behavioral Health Follow Up In-Person Visit  MRN: [id number] Name: Jason Livingston   Total time: 60 minutes  Types of Service: Video visit Patient consents to an Updox video visit and 2 patient identifiers were used to identify patient   Interpretor:No. Interpretor Name and Language: mental health   Subjective: Jason Livingston is a 55 y.o. male accompanied by  himself Patient was referred by Jason Shadow, NP for mental health. Patient reports the following symptoms/concerns: The patient reports that he has being doing okay since his last follow up session.  The patient reports that he has been playing his guitar some for self care but has not been able to afford sketch pads or other materials to draw with. He discussed financial stressors impacting his life. The patient noted that his bedtime has been varied but it is usually after 11:00 Pm. He stated that it is taking him more than an hour to fall asleep. He discussed his previous history with sleep and noted he has struggled with sleep his entire adult life. The patient noted that he does not talk in his sleep and no one has told him he sleep walks or snores. He stated he does not wake up in location other than the one he fell asleep in. He discussed activities such as eating before bed so he doesn't wake up starving. He stated he does play his guitar on his bed sometimes and mess around on his computer. He noted that overall he is doing well other than lack of sleep. The patient denied any suicidal or homicidal thoughts.   Duration of problem: Years; Severity of problem: moderate  Objective: Mood: Euthymic and Affect: Appropriate Risk of harm to self or others: No plan to harm self or others  Life Context: Family and Social: see above School/Work: see above Self-Care: see above Life Changes: see above  Patient and/or Family's Strengths/Protective Factors: Concrete supports in place (healthy food, safe environments,  etc.)  Goals Addressed: Patient will:  Reduce symptoms of: insomnia   Increase knowledge and/or ability of: coping skills, healthy habits, self-management skills, and stress reduction   Demonstrate ability to: Increase healthy adjustment to current life circumstances and Increase adequate support systems for patient/family  Progress towards Goals: Ongoing  Interventions: Interventions utilized:  CBT Cognitive Behavioral Therapy was utilized by the clinician during today's follow up session. The clinician processed with the patient how they have been doing since the last follow-up session. The clinician provided a space for the patient to ventilate their frustrations regarding their current life circumstances. Clinician measured the patient's anxiety and depression on a numerical scale. Clinician explored how the patient did with incorporating self care since his last follow-up session. Clinician assessed the patients pattern of sleep, bedtime routines, activities associated with the bed, activity and energy level while awake, night time snaking, stimulant use, daytime napping, total sleep amounts. Clinician explored the patients thoughts and associated emotions regarding sleep. Clinician explored with the patient negative thoughts regarding his ability to sleep well. Clinician encouraged the patient to practice good sleep hygiene this week in addition to keeping a sleep journal.  Standardized Assessments completed: GAD-7 and PHQ 9 GAD-7        2 PHQ-9      10  Assessment: Patient currently experiencing see above.   Patient may benefit from see above.  Plan: Follow up with behavioral health clinician on : 03/04/2021 at 4:00 PM  Behavioral recommendations:  Referral(s): [place] (In Clinic) "From scale  of 1-10, how likely are you to follow plan?":   Jason Livingston, LCSWA

## 2021-02-26 ENCOUNTER — Ambulatory Visit (INDEPENDENT_AMBULATORY_CARE_PROVIDER_SITE_OTHER): Payer: Medicaid Other | Admitting: Physician Assistant

## 2021-02-26 DIAGNOSIS — M5440 Lumbago with sciatica, unspecified side: Secondary | ICD-10-CM

## 2021-02-26 NOTE — Progress Notes (Signed)
Office Visit Note   Patient: Jason Livingston           Date of Birth: 01/30/1966           MRN: [id number] Visit Date: 02/26/2021              Requested by: Center, [address] [place],  [place] [id number] PCP: Langston Reusing, NP  Chief Complaint  Patient presents with   Lower Back - Pain, Follow-up      HPI: Patient is a pleasant 55 year old gentleman who follows up for his left shoulder pain as well as his bilateral cramping in his lower extremities with radiation into his hips and numbness in his feet.  At his last visit he was prescribed some prednisone to take orally for his back.  He said he took this for few days but then he thought it gave him severe cramping.  He also relates that the day this began he was out in the hot sun and drink 8 water bottles full of water without a electrolyte replacement.  I did also recommend some coconut water at his last visit which she does think has helped a little bit.  With regards to his shoulder I did give him a shoulder injection at his last visit he said this helped and about 80% of his symptoms have resolved  Assessment & Plan: Visit Diagnoses: No diagnosis found.  Plan: We will place an order for an MRI of his lumbar spine.  He will try and take the prednisone with food.  Obviously if he has any reactions he can stop it.  Follow-up after his MRI  Follow-Up Instructions: No follow-ups on file.   Ortho Exam  Patient is alert, oriented, no adenopathy, well-dressed, normal affect, normal respiratory effort. Examination of his back demonstrates no tenderness to palpation he does have a unequivocal straight leg raise on the left and right.  Plantar and dorsiflexion strength is 5 out of 5 shoulder has full range of motion with minimal impingement findings  Imaging: No results found. No images are attached to the encounter.  Labs: Lab Results  Component Value Date   HGBA1C 6.2 (H)  11/25/2020     Lab Results  Component Value Date   ALBUMIN 4.5 11/25/2020    No results found for: MG No results found for: VD25OH  No results found for: PREALBUMIN CBC EXTENDED Latest Ref Rng & Units 11/25/2020  WBC 3.4 - 10.8 x10E3/uL 6.2  RBC 4.14 - 5.80 x10E6/uL 4.67  HGB 13.0 - 17.7 g/dL 15.7  HCT 37.5 - 51.0 % 44.8  PLT 150 - 450 x10E3/uL 258  NEUTROABS 1.4 - 7.0 x10E3/uL 3.6  LYMPHSABS 0.7 - 3.1 x10E3/uL 2.0     There is no height or weight on file to calculate BMI.  Orders:  No orders of the defined types were placed in this encounter.  No orders of the defined types were placed in this encounter.    Procedures: No procedures performed  Clinical Data: No additional findings.  ROS:  All other systems negative, except as noted in the HPI. Review of Systems  Objective: Vital Signs: There were no vitals taken for this visit.  Specialty Comments:  No specialty comments available.  PMFS History: Patient Active Problem List   Diagnosis Date Noted   Muscle cramps 12/17/2020   Pre-diabetes 12/17/2020   Elevated lipids 12/17/2020   Encounter to establish care 11/25/2020   Essential hypertension 11/25/2020  History of asthma 11/25/2020   Anxiety and depression 11/25/2020   History of insomnia 11/25/2020   History of gastroesophageal reflux (GERD) 11/25/2020   Smoking 11/25/2020   Past Medical History:  Diagnosis Date   Anxiety    Asthma    Depression    GERD (gastroesophageal reflux disease)    Hypertension    Pre-diabetes    Sleep apnea     Family History  Problem Relation Age of Onset   Other Mother        unknown medical history   Other Father        unknwon medical history   Hypertension Maternal Grandmother    Heart disease Maternal Grandfather    Other Cousin        kidney transplant    Past Surgical History:  Procedure Laterality Date   MANDIBLE FRACTURE SURGERY     Social History   Occupational History   Not on file   Tobacco Use   Smoking status: Every Day    Packs/day: 1.00    Years: 34.00    Pack years: 34.00    Types: Cigarettes   Smokeless tobacco: Former    Quit date: 2012  Vaping Use   Vaping Use: Never used  Substance and Sexual Activity   Alcohol use: Yes    Alcohol/week: 14.0 standard drinks    Types: 14 Cans of beer per week    Comment: 2 40oz beers at night to sleep, 02/18/21 20oz with dinner everynight   Drug use: Not Currently    Types: Cocaine, Marijuana, LSD    Comment: used in his 20's, has not used in 30 years   Sexual activity: Not Currently

## 2021-03-04 ENCOUNTER — Encounter: Payer: Self-pay | Admitting: Pharmacist

## 2021-03-04 ENCOUNTER — Telehealth: Payer: Self-pay

## 2021-03-04 ENCOUNTER — Ambulatory Visit: Payer: Medicaid Other | Admitting: Pharmacist

## 2021-03-04 ENCOUNTER — Other Ambulatory Visit: Payer: Self-pay

## 2021-03-04 ENCOUNTER — Ambulatory Visit: Payer: Medicaid Other | Admitting: Licensed Clinical Social Worker

## 2021-03-04 DIAGNOSIS — Z79899 Other long term (current) drug therapy: Secondary | ICD-10-CM

## 2021-03-04 NOTE — Progress Notes (Addendum)
[facility]ication Management Clinic Visit Note  Patient: Jason Livingston MRN: [id number] Date of Birth: 06-19-1966 PCP: Langston Reusing, NP   [name] 55 y.o. male presents for a medication therapy management visit today via telephone. He was verified by name and DOB.   There were no vitals taken for this visit.  Patient Information   Past Medical History:  Diagnosis Date   Anxiety    Asthma    Depression    GERD (gastroesophageal reflux disease)    Hypertension    Pre-diabetes    Sleep apnea       Past Surgical History:  Procedure Laterality Date   MANDIBLE FRACTURE SURGERY       Family History  Problem Relation Age of Onset   Other Mother        unknown medical history   Other Father        unknwon medical history   Hypertension Maternal Grandmother    Heart disease Maternal Grandfather    Other Cousin        kidney transplant    New Diagnoses (since last visit): n/a  Lifestyle Diet: Tries to avoid fried foods, eats more fruit and vegetables. On EBT. States healthy foods are higher but tries as much as he can.  Exercise: Endorses he has a hard time exercising due to leg pain and numbness in feet. States this also affects his job.   Social History   Substance and Sexual Activity  Alcohol Use Yes   Alcohol/week: 14.0 standard drinks   Types: 14 Cans of beer per week   Comment: 2 40oz beers at night to sleep, 02/18/21 20oz with dinner everynight      Social History   Tobacco Use  Smoking Status Every Day   Packs/day: 1.00   Years: 34.00   Pack years: 34.00   Types: Cigarettes  Smokeless Tobacco Former   Quit date: 2012      Health Maintenance  Topic Date Due   COVID-19 Vaccine (1) Never done   Pneumococcal Vaccine 31-48 Years old (1 - PCV) Never done   HIV Screening  Never done   Hepatitis C Screening  Never done   TETANUS/TDAP  Never done   Zoster Vaccines- Shingrix (1 of 2) Never done   COLONOSCOPY (Pts 45-70yr Insurance coverage will  need to be confirmed)  Never done   INFLUENZA VACCINE  03/15/2021   HPV VACCINES  Aged Out  Health Maintenance/Date Completed  Last ED visit: --  Last Visit to PCP: 02/18/2021 Next Visit to PCP: 04/08/21 Specialist Visit: 03/09/21 GGertie Fey- Colonoscopy  Dental Exam: states its been a while. Does not have dentist but would like to go.  Eye Exam: States he went  approximately 6 months ago.  Prostate Exam: never but willing  Colonoscopy: 03/09/21  Flu Vaccine: no  Pneumonia Vaccine: no  COVID-19 Vaccine: no  Shingrix Vaccine: no   Outpatient Encounter Medications as of 03/04/2021  Medication Sig   albuterol (VENTOLIN HFA) 108 (90 Base) MCG/ACT inhaler Inhale 2 puffs into the lungs every 6 (six) hours as needed for wheezing or shortness of breath.   diclofenac Sodium (VOLTAREN) 1 % GEL Apply 2 g topically 4 (four) times daily.   fluticasone furoate-vilanterol (BREO ELLIPTA) 200-25 MCG/INH AEPB Inhale 1 puff into the lungs once daily at 2PM.   losartan (COZAAR) 25 MG tablet Take 1 tablet (25 mg total) by mouth once daily.   Melatonin-Theanine (MELATONIN FORTE/L-THEANINE PO) Take by mouth. Take 2 gummies by  mouth 30 minutes before bedtime.   metFORMIN (GLUCOPHAGE-XR) 500 MG 24 hr tablet Take 1 tablet (500 mg total) by mouth daily with breakfast.   omeprazole (PRILOSEC) 20 MG capsule Take 1 capsule (20 mg total) by mouth once daily.   rosuvastatin (CRESTOR) 5 MG tablet Take 1 tablet (5 mg total) by mouth once daily.   [DISCONTINUED] losartan (COZAAR) 25 MG tablet Take 1 tablet (25 mg total) by mouth daily.   [EXPIRED] polyethylene glycol (GOLYTELY) 236 g solution Take 4,000 mLs by mouth once for 1 dose as directed.   [DISCONTINUED] albuterol (VENTOLIN HFA) 108 (90 Base) MCG/ACT inhaler Inhale 2 puffs into the lungs every 6 (six) hours as needed for wheezing or shortness of breath.   [DISCONTINUED] budesonide-formoterol (SYMBICORT) 160-4.5 MCG/ACT inhaler Inhale 2 puffs into the lungs 2 (two) times  daily. (Patient not taking: Reported on 02/18/2021)   [DISCONTINUED] budesonide-formoterol (SYMBICORT) 160-4.5 MCG/ACT inhaler Inhale 2 puffs into the lungs 2 (two) times daily.   [DISCONTINUED] fluticasone furoate-vilanterol (BREO ELLIPTA) 100-25 MCG/INH AEPB Inhale 1 puff into the lungs once daily.   [DISCONTINUED] Melatonin 3 MG CAPS Take 2 capsules (6 mg total) by mouth at bedtime. (Patient not taking: Reported on 03/04/2021)   [DISCONTINUED] omeprazole (PRILOSEC) 20 MG capsule Take 1 capsule (20 mg total) by mouth daily.   [DISCONTINUED] predniSONE (DELTASONE) 10 MG tablet Take 2 tablets (20 mg total) by mouth daily with breakfast.   [DISCONTINUED] predniSONE (DELTASONE) 10 MG tablet Take 2 tablets (20 mg total) by mouth daily with breakfast. (Patient not taking: Reported on 02/18/2021)   [DISCONTINUED] rosuvastatin (CRESTOR) 5 MG tablet Take 1 tablet (5 mg total) by mouth daily.   [DISCONTINUED] traZODone (DESYREL) 100 MG tablet TAKE (1/2) TO (1) TABLET  BY MOUTH ONCE DAILY AT BEDTIME AS NEEDED. (Patient not taking: Reported on 01/21/2021)   No facility-administered encounter medications on file as of 03/04/2021.    Assessment and Plan:  Medication Management/Adherence:  Pt reports adherence to all medications. Medications are being taken care of by [facility].   Hypertension:  Currently on Losartan. Per PCP visit on 02/18/21 HTN is controlled. Pt does endorse he has headaches and dizziness sometimes but is not sure if this is from BP medication. Pt is unable to check blood pressure at home because he does not have a monitor.  Followed by PCP: Caryl Asp, NP.   Hyperlipidemia:  Currently on Rosuvastatin. CK was ordered to r/o ADEs of muscle pain. Followed by: Caryl Asp, NP.   Asthma:  Currently on Ventolin. Pt did not report any difficulties. Pt is a smoker. Followed by: Caryl Asp, NP.    Prediabetes:  Currently on metformin. Last A1c was 6.2%. Pt does  not check BG because he does not have a monitor. Per visit on 02/18/21 pt is tolerating metformin well with no side effects. Followed by PCP: Alda Berthold, NP.   GERD:  Currently on omeprazole. Per PCP note, pt is well controlled. Pt had no complaints today.    Depression/Anxiety/Difficulty Sleeping:  Pt believes he was misdiagnosed with Anxiety/Depression. Patient seeing a provider at [facility] due to symptoms of anxiety and depression. Also had a chest CT (12/14/2020) after he had already started with [facility], CT impression recorded as Aortic Atherosclerosis and emphysema. Pt states he wants anxiety and depression removed from his chart; explained to patient that we are not able to remove any diagnoses.  Pt's main issue is difficulty sleeping. Pt takes OTC Olly Sleep gummies that consist  of melatonin, L-theanine and botanicals. Pt endorses OTC gummies are not working. Currently receiving 8 weeks of CBT. Pt was prescribed trazodone but elected to not take it due to side effects of priapism. Note on 01/28/21 states Dr. Octavia Heir recommended Mirtazapine 7.5 mg at bedtime. Pt states he is willing to try this medication. Plan to message Jerrilyn Cairo, LCSWA regarding medication. Ms. Blanca Friend has confirmed she will reach out to pt and provider via secure chat. Followed by [facility].   FU with clinic in 1 year.   M. [place] Jaxin Fulfer, BS  PharmD Candidate [phone]  [facility]   Cosigned: Cleopatra Cedar. Dicky Doe, PharmD [facility] [place] Operations Coordinator [phone]

## 2021-03-04 NOTE — Telephone Encounter (Signed)
Social worker rescheduled patient's appointment per provider request.

## 2021-03-05 ENCOUNTER — Other Ambulatory Visit: Payer: Self-pay

## 2021-03-09 ENCOUNTER — Encounter: Payer: Self-pay | Admitting: Gastroenterology

## 2021-03-09 ENCOUNTER — Ambulatory Visit: Payer: Self-pay | Admitting: Certified Registered"

## 2021-03-09 ENCOUNTER — Telehealth: Payer: Self-pay | Admitting: Pharmacist

## 2021-03-09 ENCOUNTER — Ambulatory Visit
Admission: RE | Admit: 2021-03-09 | Discharge: 2021-03-09 | Disposition: A | Discharge status: Home / Self Care | Payer: Self-pay | Attending: Gastroenterology | Admitting: Gastroenterology

## 2021-03-09 ENCOUNTER — Encounter
Admission: RE | Disposition: A | Discharge status: Home / Self Care | Payer: Self-pay | Source: Home / Self Care | Attending: Gastroenterology

## 2021-03-09 DIAGNOSIS — K635 Polyp of colon: Secondary | ICD-10-CM

## 2021-03-09 DIAGNOSIS — F17210 Nicotine dependence, cigarettes, uncomplicated: Secondary | ICD-10-CM | POA: Insufficient documentation

## 2021-03-09 DIAGNOSIS — Z791 Long term (current) use of non-steroidal anti-inflammatories (NSAID): Secondary | ICD-10-CM | POA: Insufficient documentation

## 2021-03-09 DIAGNOSIS — Z8249 Family history of ischemic heart disease and other diseases of the circulatory system: Secondary | ICD-10-CM | POA: Insufficient documentation

## 2021-03-09 DIAGNOSIS — Z841 Family history of disorders of kidney and ureter: Secondary | ICD-10-CM | POA: Insufficient documentation

## 2021-03-09 DIAGNOSIS — Z1211 Encounter for screening for malignant neoplasm of colon: Secondary | ICD-10-CM | POA: Insufficient documentation

## 2021-03-09 DIAGNOSIS — Z7951 Long term (current) use of inhaled steroids: Secondary | ICD-10-CM | POA: Insufficient documentation

## 2021-03-09 DIAGNOSIS — R7303 Prediabetes: Secondary | ICD-10-CM | POA: Insufficient documentation

## 2021-03-09 DIAGNOSIS — D125 Benign neoplasm of sigmoid colon: Secondary | ICD-10-CM | POA: Insufficient documentation

## 2021-03-09 DIAGNOSIS — Z7984 Long term (current) use of oral hypoglycemic drugs: Secondary | ICD-10-CM | POA: Insufficient documentation

## 2021-03-09 HISTORY — PX: COLONOSCOPY: SHX5424

## 2021-03-09 SURGERY — COLONOSCOPY
Anesthesia: General

## 2021-03-09 MED ORDER — PROPOFOL 500 MG/50ML IV EMUL
INTRAVENOUS | Status: DC | PRN
  Administered 2021-03-09: 155 ug/kg/min via INTRAVENOUS

## 2021-03-09 MED ORDER — SODIUM CHLORIDE 0.9 % IV SOLN: INTRAVENOUS | Status: DC

## 2021-03-09 MED ORDER — MIDAZOLAM HCL 2 MG/2ML IJ SOLN
INTRAMUSCULAR | Status: AC
  Filled 2021-03-09: qty 2

## 2021-03-09 MED ORDER — PROPOFOL 10 MG/ML IV BOLUS
INTRAVENOUS | Status: DC | PRN
  Administered 2021-03-09: 80 mg via INTRAVENOUS

## 2021-03-09 MED ORDER — MIDAZOLAM HCL 2 MG/2ML IJ SOLN
INTRAMUSCULAR | Status: DC | PRN
  Administered 2021-03-09: 2 mg via INTRAVENOUS

## 2021-03-09 MED ORDER — LIDOCAINE HCL (CARDIAC) PF 100 MG/5ML IV SOSY
PREFILLED_SYRINGE | INTRAVENOUS | Status: DC | PRN
  Administered 2021-03-09: 100 mg via INTRAVENOUS

## 2021-03-09 NOTE — Transfer of Care (Addendum)
Immediate Anesthesia Transfer of Care Note  Patient: Moore Spizzirri  Procedure(s) Performed: COLONOSCOPY  Patient Location: Endoscopy Unit  Anesthesia Type:General  Level of Consciousness: awake, drowsy and patient cooperative  Airway & Oxygen Therapy: Patient Spontanous Breathing  Post-op Assessment: Report given to RN and Post -op Vital signs reviewed and stable  Post vital signs: Reviewed and stable  Last Vitals:  Vitals Value Taken Time  BP 107/64 03/09/21 1202  Temp 36.2 C 03/09/21 1202  Pulse 93 03/09/21 1203  Resp 24 03/09/21 1203  SpO2 95 % 03/09/21 1203  Vitals shown include unvalidated device data.  Last Pain:  Vitals:   03/09/21 1202  TempSrc: Temporal  PainSc:          Complications: No notable events documented.

## 2021-03-09 NOTE — Op Note (Signed)
[facility] Gastroenterology Patient Name: Jason Livingston Procedure Date: 03/09/2021 11:36 AM MRN: [id number] Account #: [id number] Date of Birth: [birth year] Admit Type: Outpatient Age: 55 Room: [facility] ENDO ROOM 2 Gender: Male Note Status: Finalized Procedure:             Colonoscopy Indications:           Screening for colorectal malignant neoplasm Providers:             Jonathon Bellows MD, MD Referring MD:          No Local Md, MD (Referring MD) Medicines:             Monitored Anesthesia Care Complications:         No immediate complications. Procedure:             Pre-Anesthesia Assessment:                        - Prior to the procedure, a History and Physical was                         performed, and patient medications, allergies and                         sensitivities were reviewed. The patient's tolerance                         of previous anesthesia was reviewed.                        - The risks and benefits of the procedure and the                         sedation options and risks were discussed with the                         patient. All questions were answered and informed                         consent was obtained.                        - ASA Grade Assessment: II - A patient with mild                         systemic disease.                        After obtaining informed consent, the colonoscope was                         passed under direct vision. Throughout the procedure,                         the patient's blood pressure, pulse, and oxygen                         saturations were monitored continuously. The                         Colonoscope was introduced through the anus  and                         advanced to the the cecum, identified by the                         appendiceal orifice. The colonoscopy was performed                         with ease. The patient tolerated the procedure well.                         The quality of  the bowel preparation was excellent. Findings:      The perianal and digital rectal examinations were normal.      A 5 mm polyp was found in the sigmoid colon. The polyp was sessile. The       polyp was removed with a cold snare. Resection and retrieval were       complete.      The exam was otherwise without abnormality on direct and retroflexion       views. Impression:            - One 5 mm polyp in the sigmoid colon, removed with a                         cold snare. Resected and retrieved.                        - The examination was otherwise normal on direct and                         retroflexion views. Recommendation:        - Discharge patient to home (with escort).                        - Resume previous diet.                        - Continue present medications.                        - Await pathology results.                        - Repeat colonoscopy for surveillance based on                         pathology results. Procedure Code(s):     --- Professional ---                        [phone], Colonoscopy, flexible; with removal of                         tumor(s), polyp(s), or other lesion(s) by snare                         technique Diagnosis Code(s):     --- Professional ---                        Z12.11, Encounter for  screening for malignant neoplasm                         of colon                        K63.5, Polyp of colon CPT copyright 2019 [employer]. All rights reserved. The codes documented in this report are preliminary and upon coder review may  be revised to meet current compliance requirements. Jonathon Bellows, MD Jonathon Bellows MD, MD 03/09/2021 11:57:18 AM This report has been signed electronically. Number of Addenda: 0 Note Initiated On: 03/09/2021 11:36 AM Scope Withdrawal Time: 0 hours 11 minutes 2 seconds  Total Procedure Duration: 0 hours 13 minutes 24 seconds  Estimated Blood Loss:  Estimated blood loss: none.      [facility]

## 2021-03-09 NOTE — Telephone Encounter (Signed)
03/09/2021 3:44:53 PM - Memory Dance Ellipta 200/25 faxed to [facility] - Tuesday, March 09, 2021 3:43 PM --Faxed [employer] application for enrollment for Breo Ellipta 200/25 mcg Inhale 1 puff into to lungs once daily at [facility].

## 2021-03-09 NOTE — H&P (Signed)
Jonathon Bellows, MD [address], [place], [place], [place], [address] [address], [place], [place], [place], [address] Phone: [phone]  Fax: [phone]  Primary Care Physician:  Langston Reusing, NP   Pre-Procedure History & Physical: HPI:  Jason Livingston is a 55 y.o. male is here for an colonoscopy.   Past Medical History:  Diagnosis Date   Asthma    GERD (gastroesophageal reflux disease)    Hypertension    Pre-diabetes    Sleep apnea     Past Surgical History:  Procedure Laterality Date   MANDIBLE FRACTURE SURGERY      Prior to Admission medications   Medication Sig Start Date End Date Taking? Authorizing Provider  albuterol (VENTOLIN HFA) 108 (90 Base) MCG/ACT inhaler Inhale 2 puffs into the lungs every 6 (six) hours as needed for wheezing or shortness of breath. 02/18/21  Yes Iloabachie, Chioma E, NP  diclofenac Sodium (VOLTAREN) 1 % GEL Apply 2 g topically 4 (four) times daily. 12/17/20  Yes Iloabachie, Chioma E, NP  fluticasone furoate-vilanterol (BREO ELLIPTA) 200-25 MCG/INH AEPB Inhale 1 puff into the lungs once daily at [facility]. 02/23/21  Yes Iloabachie, Chioma E, NP  losartan (COZAAR) 25 MG tablet Take 1 tablet (25 mg total) by mouth once daily. 02/18/21  Yes Iloabachie, Chioma E, NP  Melatonin-Theanine (MELATONIN FORTE/L-THEANINE PO) Take by mouth. Take 2 gummies by mouth 30 minutes before bedtime.    [provider]  metFORMIN (GLUCOPHAGE-XR) 500 MG 24 hr tablet Take 1 tablet (500 mg total) by mouth daily with breakfast. 02/18/21   Iloabachie, Chioma E, NP  omeprazole (PRILOSEC) 20 MG capsule Take 1 capsule (20 mg total) by mouth once daily. 02/18/21   Iloabachie, Chioma E, NP  rosuvastatin (CRESTOR) 5 MG tablet Take 1 tablet (5 mg total) by mouth once daily. 02/18/21   Iloabachie, Chioma E, NP  budesonide-formoterol (SYMBICORT) 160-4.5 MCG/ACT inhaler Inhale 2 puffs into the lungs 2 (two) times daily. 02/23/21 02/24/21  Iloabachie, Chioma E, NP  traZODone  (DESYREL) 100 MG tablet TAKE (1/2) TO (1) TABLET  BY MOUTH ONCE DAILY AT BEDTIME AS NEEDED. Patient not taking: Reported on 01/21/2021 12/18/20 01/25/21      Allergies as of 02/23/2021   (No Known Allergies)    Family History  Problem Relation Age of Onset   Other Mother        unknown medical history   Other Father        unknwon medical history   Hypertension Maternal Grandmother    Heart disease Maternal Grandfather    Other Cousin        kidney transplant    Social History   Socioeconomic History   Marital status: Single    Spouse name: Not on file   Number of children: Not on file   Years of education: Not on file   Highest education level: Not on file  Occupational History   Not on file  Tobacco Use   Smoking status: Every Day    Packs/day: 1.00    Years: 34.00    Pack years: 34.00    Types: Cigarettes   Smokeless tobacco: Former    Quit date: 2012  Vaping Use   Vaping Use: Never used  Substance and Sexual Activity   Alcohol use: Yes    Alcohol/week: 14.0 standard drinks    Types: 14 Cans of beer per week    Comment: 2 40oz beers at night to sleep, 02/18/21 20oz with dinner  everynight   Drug use: Not Currently    Types: Cocaine, Marijuana, LSD    Comment: used in his 20's, has not used in 30 years   Sexual activity: Not Currently  Other Topics Concern   Not on file  Social History Narrative   Not on file   Social Determinants of Health   Financial Resource Strain: Not on file  Food Insecurity: No Food Insecurity   Worried About Charity fundraiser in the Last Year: Never true   Arboriculturist in the Last Year: Never true  Transportation Needs: Unmet Transportation Needs   Lack of Transportation (Medical): Yes   Lack of Transportation (Non-Medical): Yes  Physical Activity: Not on file  Stress: Not on file  Social Connections: Not on file  Intimate Partner Violence: Not on file    Review of Systems: See HPI, otherwise negative ROS  Physical  Exam: BP (!) 146/85   Pulse 88   Temp (!) 97.1 F (36.2 C) (Temporal)   Resp 18   Ht '6\' 1"'$  (1.854 m)   Wt 90.7 kg   SpO2 97%   BMI 26.39 kg/m  General:   Alert,  pleasant and cooperative in NAD Head:  Normocephalic and atraumatic. Neck:  Supple; no masses or thyromegaly. Lungs:  Clear throughout to auscultation, normal respiratory effort.    Heart:  +S1, +S2, Regular rate and rhythm, No edema. Abdomen:  Soft, nontender and nondistended. Normal bowel sounds, without guarding, and without rebound.   Neurologic:  Alert and  oriented x4;  grossly normal neurologically.  Impression/Plan: Jason Livingston is here for an colonoscopy to be performed for Screening colonoscopy average risk   Risks, benefits, limitations, and alternatives regarding  colonoscopy have been reviewed with the patient.  Questions have been answered.  All parties agreeable.   Jonathon Bellows, MD  03/09/2021, 11:33 AM

## 2021-03-09 NOTE — Anesthesia Procedure Notes (Signed)
Procedure Name: General with mask airway Date/Time: 03/09/2021 11:42 AM Performed by: Kelton Pillar, CRNA Pre-anesthesia Checklist: Patient identified, Emergency Drugs available, Suction available and Patient being monitored Patient Re-evaluated:Patient Re-evaluated prior to induction Oxygen Delivery Method: Simple face mask Induction Type: IV induction Placement Confirmation: positive ETCO2 and CO2 detector Dental Injury: Teeth and Oropharynx as per pre-operative assessment

## 2021-03-09 NOTE — Anesthesia Postprocedure Evaluation (Signed)
Anesthesia Post Note  Patient: Jason Livingston  Procedure(s) Performed: COLONOSCOPY  Patient location during evaluation: Endoscopy Anesthesia Type: General Level of consciousness: awake and alert Pain management: pain level controlled Vital Signs Assessment: post-procedure vital signs reviewed and stable Respiratory status: spontaneous breathing, nonlabored ventilation, respiratory function stable and patient connected to nasal cannula oxygen Cardiovascular status: blood pressure returned to baseline and stable Postop Assessment: no apparent nausea or vomiting Anesthetic complications: no   No notable events documented.   Last Vitals:  Vitals:   03/09/21 1212 03/09/21 1232  BP: 125/90 (!) 139/95  Pulse:    Resp:    Temp:    SpO2:      Last Pain:  Vitals:   03/09/21 1232  TempSrc:   PainSc: 0-No pain                 Precious Haws Kendra Grissett

## 2021-03-09 NOTE — Anesthesia Preprocedure Evaluation (Signed)
Anesthesia Evaluation  Patient identified by MRN, date of birth, ID band Patient awake    Reviewed: Allergy & Precautions, NPO status , Patient's Chart, lab work & pertinent test results  History of Anesthesia Complications Negative for: history of anesthetic complications  Airway Mallampati: II  TM Distance: >3 FB Neck ROM: Full    Dental  (+) Poor Dentition   Pulmonary asthma , sleep apnea , Current SmokerPatient did not abstain from smoking.,    breath sounds clear to auscultation- rhonchi (-) wheezing      Cardiovascular Exercise Tolerance: Good hypertension, Pt. on medications (-) CAD, (-) Past MI, (-) Cardiac Stents and (-) CABG  Rhythm:Regular Rate:Normal - Systolic murmurs and - Diastolic murmurs    Neuro/Psych neg Seizures PSYCHIATRIC DISORDERS Anxiety Depression negative neurological ROS     GI/Hepatic Neg liver ROS, GERD  ,  Endo/Other  negative endocrine ROSneg diabetes  Renal/GU negative Renal ROS     Musculoskeletal negative musculoskeletal ROS (+)   Abdominal (+) - obese,   Peds  Hematology negative hematology ROS (+)   Anesthesia Other Findings Past Medical History: No date: Asthma No date: GERD (gastroesophageal reflux disease) No date: Hypertension No date: Pre-diabetes No date: Sleep apnea   Reproductive/Obstetrics                             Anesthesia Physical Anesthesia Plan  ASA: 2  Anesthesia Plan: General   Post-op Pain Management:    Induction: Intravenous  PONV Risk Score and Plan: 0 and Propofol infusion  Airway Management Planned: Natural Airway  Additional Equipment:   Intra-op Plan:   Post-operative Plan:   Informed Consent: I have reviewed the patients History and Physical, chart, labs and discussed the procedure including the risks, benefits and alternatives for the proposed anesthesia with the patient or authorized representative who  has indicated his/her understanding and acceptance.     Dental advisory given  Plan Discussed with: CRNA and Anesthesiologist  Anesthesia Plan Comments:         Anesthesia Quick Evaluation

## 2021-03-10 ENCOUNTER — Encounter: Payer: Self-pay | Admitting: Gastroenterology

## 2021-03-10 LAB — SURGICAL PATHOLOGY

## 2021-03-11 ENCOUNTER — Encounter: Payer: Self-pay | Admitting: Gastroenterology

## 2021-03-17 ENCOUNTER — Other Ambulatory Visit: Payer: Self-pay

## 2021-03-18 ENCOUNTER — Ambulatory Visit: Payer: Medicaid Other | Admitting: Licensed Clinical Social Worker

## 2021-03-18 ENCOUNTER — Other Ambulatory Visit: Payer: Self-pay

## 2021-03-18 ENCOUNTER — Ambulatory Visit: Payer: Self-pay | Admitting: Licensed Clinical Social Worker

## 2021-03-18 DIAGNOSIS — G479 Sleep disorder, unspecified: Secondary | ICD-10-CM

## 2021-03-18 NOTE — BH Specialist Note (Signed)
Integrated Behavioral Health Follow Up Telephone Visit  MRN: [id number] Name: Jason Livingston  Total time: 30 minutes  Types of Service: Telephone visitPatient consents to telephone visit and 2 patient identifiers were used to identify patient   Interpretor:No. Interpretor Name and Language: N/A  Subjective: Jason Livingston is a 55 y.o. male accompanied by  himself Patient was referred by Carlyon Shadow, NP  for mental health. Patient reports the following symptoms/concerns: The patient reports that he has been doing okay since his last follow-up appointment. He shared that he has a bit of dental pain today and is not feeling well. He shared that he is still experiencing some trouble falling asleep and woke up several times in the middle of the night mostly due to pain. He shared that he was not able to work this week but feels his boss will keep his position open for him. He noted that overall everything is going okay. The patient denied any suicidal or homicidal thoughts.  Duration of problem: Years; Severity of problem: mild  Objective: Mood: Euthymic and Affect: Appropriate Risk of harm to self or others: No plan to harm self or others  Life Context: Family and Social: see above School/Work: see above Self-Care: see above Life Changes: see above  Patient and/or Family's Strengths/Protective Factors: Concrete supports in place (healthy food, safe environments, etc.)  Goals Addressed: Patient will:  Reduce symptoms of: anxiety, depression, insomnia, and stress   Increase knowledge and/or ability of: coping skills, healthy habits, self-management skills, and stress reduction   Demonstrate ability to: Increase healthy adjustment to current life circumstances and Increase adequate support systems for patient/family  Progress towards Goals: Ongoing  Interventions: Interventions utilized:  CBT Cognitive Behavioral Therapy was utilized by the clinician during today's follow up  session. The clinician processed with the patient how they have been doing since the last follow-up session. The clinician provided a space for the patient to ventilate their frustrations regarding their current life circumstances. The clinician encouraged the patient to utilize their coping skills to deal with their current life circumstances.   Standardized Assessments completed: Patient declined screening   Assessment: Patient currently experiencing see above.   Patient may benefit from see above.  Plan: Follow up with behavioral health clinician on : 03/30/2021 at 5:00 PM Behavioral recommendations:  Referral(s): [place] (In Clinic) "From scale of 1-10, how likely are you to follow plan?":   Lesli Albee, LCSWA

## 2021-03-23 ENCOUNTER — Ambulatory Visit
Admission: RE | Admit: 2021-03-23 | Discharge: 2021-03-23 | Disposition: A | Discharge status: Home / Self Care | Payer: Self-pay | Source: Ambulatory Visit | Attending: Physician Assistant | Admitting: Physician Assistant

## 2021-03-23 ENCOUNTER — Other Ambulatory Visit: Payer: Self-pay

## 2021-03-23 DIAGNOSIS — M5440 Lumbago with sciatica, unspecified side: Secondary | ICD-10-CM | POA: Insufficient documentation

## 2021-03-25 ENCOUNTER — Other Ambulatory Visit: Payer: Self-pay

## 2021-03-25 ENCOUNTER — Other Ambulatory Visit: Payer: Self-pay | Admitting: Gerontology

## 2021-03-25 DIAGNOSIS — F32A Depression, unspecified: Secondary | ICD-10-CM

## 2021-03-25 DIAGNOSIS — F419 Anxiety disorder, unspecified: Secondary | ICD-10-CM

## 2021-03-25 MED ORDER — MIRTAZAPINE 15 MG PO TABS
7.5000 mg | ORAL_TABLET | Freq: Every day | ORAL | 0 refills | Status: DC
  Filled 2021-03-25: qty 15, 30d supply, fill #0

## 2021-03-26 ENCOUNTER — Other Ambulatory Visit: Payer: Self-pay

## 2021-03-30 ENCOUNTER — Ambulatory Visit: Payer: Self-pay | Admitting: Licensed Clinical Social Worker

## 2021-03-30 ENCOUNTER — Other Ambulatory Visit: Payer: Self-pay

## 2021-03-30 ENCOUNTER — Encounter: Payer: Medicaid Other | Admitting: Licensed Clinical Social Worker

## 2021-03-30 ENCOUNTER — Ambulatory Visit (INDEPENDENT_AMBULATORY_CARE_PROVIDER_SITE_OTHER): Payer: Self-pay | Admitting: Orthopedic Surgery

## 2021-03-30 DIAGNOSIS — M5410 Radiculopathy, site unspecified: Secondary | ICD-10-CM

## 2021-03-30 DIAGNOSIS — F32A Depression, unspecified: Secondary | ICD-10-CM

## 2021-03-30 DIAGNOSIS — G479 Sleep disorder, unspecified: Secondary | ICD-10-CM

## 2021-03-30 NOTE — BH Specialist Note (Signed)
Integrated Behavioral Health Follow Up Telephone Visit  MRN: [id number] Name: Jason Livingston   Total time: 20 minutes  Types of Service: Telephone visit Patient consents to telephone visit and 2 patient identifiers were used to identify patient   Interpretor:No. Interpretor Name and Language: N/A  Subjective: Jason Livingston is a 55 y.o. male accompanied by  himself  Patient was referred by Carlyon Shadow, Np  for mental health. Patient reports the following symptoms/concerns: The patient reports that he has ben doing about the same since his last follow-up session. He noted that he spoke to his boss about their disagreement and everything is going better now. He noted that he is not getting as many hours as he would like to because of his health and medical appointments. He discussed financial stressors impacting his life. He discussed relationship stressors and his past relationships. He shared that he is not drawing because he is having difficulties affording a sketch pad , but he notes that he is playing the guitar for self care. He shared that he is still experiencing some sleep disturbances. He noted that it is hot in his room and that is not helping him to sleep well. [name] stated that his overall mood has been irritable this week. The patient denied any suicidal or homicidal thoughts.  Duration of problem: Years; Severity of problem: moderate  Objective: Mood: Euthymic and Affect: Appropriate Risk of harm to self or others: No plan to harm self or others  Life Context: Family and Social: see above School/Work: see above Self-Care: see above Life Changes: see above  Patient and/or Family's Strengths/Protective Factors: Concrete supports in place (healthy food, safe environments, etc.)  Goals Addressed: Patient will:  Reduce symptoms of: anxiety, depression, insomnia, and stress   Increase knowledge and/or ability of: coping skills, healthy habits, self-management skills, and  stress reduction   Demonstrate ability to: Increase healthy adjustment to current life circumstances  Progress towards Goals: Ongoing  Interventions: Interventions utilized:  Sleep Hygiene and Psychoeducation and/or Health Education was utilized by the clinician during today's follow up session. The clinician processed with the patient how they have been doing since the last follow-up session. The clinician provided a space for the patient to ventilate their frustrations regarding their current life circumstances. Clinician measured the patient's anxiety on a numerical scale.  Clinician again assessed the patients pattern of sleep, bedtime routines, activities associated with the bed, activity and energy level while awake, night time snacking, stimulant use, daytime napping, total sleep amounts. Clinician explored the patients thoughts and associated emotions regarding sleep.  Standardized Assessments completed: GAD-7 GAD-7    5   Assessment: Patient currently experiencing see above.   Patient may benefit from see above.  Plan: Follow up with behavioral health clinician on : 04/14/2021 at 5:00 PM  Behavioral recommendations:  Referral(s): [place] (In Clinic) "From scale of 1-10, how likely are you to follow plan?":   Lesli Albee, LCSWA

## 2021-03-31 ENCOUNTER — Other Ambulatory Visit: Payer: Medicaid Other

## 2021-03-31 DIAGNOSIS — E785 Hyperlipidemia, unspecified: Secondary | ICD-10-CM

## 2021-03-31 DIAGNOSIS — R252 Cramp and spasm: Secondary | ICD-10-CM

## 2021-03-31 DIAGNOSIS — R7303 Prediabetes: Secondary | ICD-10-CM

## 2021-04-01 LAB — LIPID PANEL
Chol/HDL Ratio: 4.9 ratio (ref 0.0–5.0)
Cholesterol, Total: 230 mg/dL — ABNORMAL HIGH (ref 100–199)
HDL: 47 mg/dL (ref >39)
LDL Chol Calc (NIH): 137 mg/dL — ABNORMAL HIGH (ref 0–99)
Triglycerides: 257 mg/dL — ABNORMAL HIGH (ref 0–149)
VLDL Cholesterol Cal: 46 mg/dL — ABNORMAL HIGH (ref 5–40)

## 2021-04-01 LAB — CK: Total CK: 99 U/L (ref 41–331)

## 2021-04-01 LAB — HEMOGLOBIN A1C
Est. average glucose Bld gHb Est-mCnc: 128 mg/dL
Hgb A1c MFr Bld: 6.1 % — ABNORMAL HIGH (ref 4.8–5.6)

## 2021-04-01 NOTE — BH Specialist Note (Deleted)
Integrated Behavioral Health Follow Up In-Person Visit  MRN: [id number] Name: Jason Livingston  Number of [place] Clinician visits: {IBH Number of Visits:21014052} Session Start time: ***  Session End time: *** Total time: {IBH Total Time:21014050} minutes  Types of Service: {CHL AMB TYPE OF SERVICE:5630117406}  Interpretor:{yes [id number] Interpretor Name and Language: ***  Subjective: Jason Livingston is a 55 y.o. male accompanied by {Patient accompanied by:[phone]} Patient was referred by *** for ***. Patient reports the following symptoms/concerns: *** Duration of problem: ***; Severity of problem: {Mild/Moderate/Severe:20260}  Objective: Mood: {BHH MOOD:22306} and Affect: {BHH AFFECT:22307} Risk of harm to self or others: {CHL AMB BH Suicide Current Mental Status:21022748}  Life Context: Family and Social: *** School/Work: *** Self-Care: *** Life Changes: ***  Patient and/or Family's Strengths/Protective Factors: {CHL AMB BH PROTECTIVE FACTORS:[phone]}  Goals Addressed: Patient will:  Reduce symptoms of: {IBH Symptoms:21014056}   Increase knowledge and/or ability of: {IBH Patient Tools:21014057}   Demonstrate ability to: {IBH Goals:21014053}  Progress towards Goals: {CHL AMB BH PROGRESS TOWARDS GOALS:[phone]}  Interventions: Interventions utilized:  {IBH Interventions:21014054} Standardized Assessments completed: {IBH Screening Tools:21014051}  Patient and/or Family Response: ***  Patient Centered Plan: Patient is on the following Treatment Plan(s): *** Assessment: Patient currently experiencing ***.   Patient may benefit from ***.  Plan: Follow up with behavioral health clinician on : *** Behavioral recommendations: *** Referral(s): {IBH Referrals:21014055} "From scale of 1-10, how likely are you to follow plan?": ***  Lesli Albee, LCSWA

## 2021-04-07 ENCOUNTER — Encounter: Payer: Self-pay | Admitting: Orthopedic Surgery

## 2021-04-07 ENCOUNTER — Ambulatory Visit: Payer: Medicaid Other | Admitting: Gerontology

## 2021-04-07 ENCOUNTER — Encounter: Payer: Self-pay | Admitting: Gerontology

## 2021-04-07 ENCOUNTER — Other Ambulatory Visit: Payer: Self-pay

## 2021-04-07 VITALS — BP 125/84 | HR 92 | Temp 97.6°F | Resp 18 | Ht 73.0 in | Wt 199.6 lb

## 2021-04-07 DIAGNOSIS — R7303 Prediabetes: Secondary | ICD-10-CM

## 2021-04-07 DIAGNOSIS — E785 Hyperlipidemia, unspecified: Secondary | ICD-10-CM

## 2021-04-07 MED ORDER — ROSUVASTATIN CALCIUM 10 MG PO TABS
10.0000 mg | ORAL_TABLET | Freq: Every day | ORAL | 2 refills | Status: DC
  Filled 2021-04-07: qty 30, 30d supply, fill #0

## 2021-04-07 NOTE — Progress Notes (Signed)
Established Patient Office Visit  Subjective:  Patient ID: Jason Livingston, male    DOB: 1966-08-09  Age: 55 y.o. MRN: [id number]  CC:  Chief Complaint  Patient presents with   Follow-up    Labs drawn 03/31/21    HPI Jason Livingston  is a 55 y/o male who has history of Prediabetes, Hypertension, Asthma, Anxiety, Depression, GERD presents for lab review.  He states that he is compliant with his medication, denies any side effects and continues to make healthy lifestyle changes.  His hemoglobin A1c done on 03/31/2021 decreased from 6.2% to 6.1%, and he continues to tolerate 500 mg metformin daily.  He continues on 5 mg Crestor, his LDL decreased from 141 mg per DL to 137 mg per DL, total cholesterol decreased from 232 mg per DL to 230 mg per DL and triglyceride increased from 234 mg per DL to 257 mg per DL.  His creatinine kinase was 99 U/L.  He had endoscopy and colonoscopy done on 03/09/2021 by Dr. Jonathon Bellows, One 5 mm polyp in the sigmoid colon, removed with a cold snare. Resected and retrieved. The examination was otherwise normal on direct and retroflexion views.  Overall, he states that he is doing well and offers no further complaint.  Past Medical History:  Diagnosis Date   Asthma    GERD (gastroesophageal reflux disease)    Hypertension    Pre-diabetes    Sleep apnea     Past Surgical History:  Procedure Laterality Date   COLONOSCOPY N/A 03/09/2021   Procedure: COLONOSCOPY;  Surgeon: Jonathon Bellows, MD;  Location: [facility] ENDOSCOPY;  Service: Gastroenterology;  Laterality: N/A;   MANDIBLE FRACTURE SURGERY      Family History  Problem Relation Age of Onset   Other Mother        unknown medical history   Other Father        unknwon medical history   Hypertension Maternal Grandmother    Heart disease Maternal Grandfather    Other Cousin        kidney transplant    Social History   Socioeconomic History   Marital status: Single    Spouse name: Not on file   Number of  children: Not on file   Years of education: Not on file   Highest education level: Not on file  Occupational History   Not on file  Tobacco Use   Smoking status: Every Day    Packs/day: 1.00    Years: 34.00    Pack years: 34.00    Types: Cigarettes   Smokeless tobacco: Former    Quit date: 2012  Vaping Use   Vaping Use: Never used  Substance and Sexual Activity   Alcohol use: Yes    Alcohol/week: 14.0 standard drinks    Types: 14 Cans of beer per week    Comment: 2 40oz beers at night to sleep, 02/18/21 20oz with dinner everynight   Drug use: Not Currently    Types: Cocaine, Marijuana, LSD    Comment: used in his 20's, has not used in 30 years   Sexual activity: Not Currently  Other Topics Concern   Not on file  Social History Narrative   Not on file   Social Determinants of Health   Financial Resource Strain: Not on file  Food Insecurity: No Food Insecurity   Worried About Running Out of Food in the Last Year: Never true   Ran Out of Food in the Last Year: Never true  Transportation  Needs: Unmet Transportation Needs   Lack of Transportation (Medical): Yes   Lack of Transportation (Non-Medical): Yes  Physical Activity: Not on file  Stress: Not on file  Social Connections: Not on file  Intimate Partner Violence: Not on file    Outpatient Medications Prior to Visit  Medication Sig Dispense Refill   albuterol (VENTOLIN HFA) 108 (90 Base) MCG/ACT inhaler Inhale 2 puffs into the lungs every 6 (six) hours as needed for wheezing or shortness of breath. 6.7 g 2   diclofenac Sodium (VOLTAREN) 1 % GEL Apply 2 g topically 4 (four) times daily. 2 g 0   fluticasone furoate-vilanterol (BREO ELLIPTA) 200-25 MCG/INH AEPB Inhale 1 puff into the lungs once daily at [facility]. 60 each 1   losartan (COZAAR) 25 MG tablet Take 1 tablet (25 mg total) by mouth once daily. 30 tablet 3   Melatonin-Theanine (MELATONIN FORTE/L-THEANINE PO) Take by mouth. Take 2 gummies by mouth 30 minutes before  bedtime.     metFORMIN (GLUCOPHAGE-XR) 500 MG 24 hr tablet Take 1 tablet (500 mg total) by mouth daily with breakfast. 30 tablet 2   mirtazapine (REMERON) 15 MG tablet Take 0.5 tablets (7.5 mg total) by mouth once daily at bedtime. 15 tablet 0   omeprazole (PRILOSEC) 20 MG capsule Take 1 capsule (20 mg total) by mouth once daily. 30 capsule 2   rosuvastatin (CRESTOR) 5 MG tablet Take 1 tablet (5 mg total) by mouth once daily. 30 tablet 2   No facility-administered medications prior to visit.    No Known Allergies  ROS Review of Systems  Constitutional: Negative.   Eyes: Negative.   Respiratory: Negative.    Cardiovascular: Negative.   Neurological: Negative.   Psychiatric/Behavioral: Negative.       Objective:    Physical Exam HENT:     Head: Normocephalic and atraumatic.     Mouth/Throat:     Mouth: Mucous membranes are moist.  Cardiovascular:     Rate and Rhythm: Normal rate and regular rhythm.     Pulses: Normal pulses.     Heart sounds: Normal heart sounds.  Pulmonary:     Effort: Pulmonary effort is normal.     Breath sounds: Normal breath sounds.  Skin:    General: Skin is warm.  Neurological:     General: No focal deficit present.     Mental Status: He is alert and oriented to person, place, and time. Mental status is at baseline.    BP 125/84 (BP Location: Left Arm, Patient Position: Sitting, Cuff Size: Large)   Pulse 92   Temp 97.6 F (36.4 C)   Resp 18   Ht _0  (1.854 m)   Wt 199 lb 9.6 oz (90.5 kg)   SpO2 95%   BMI 26.33 kg/m  Wt Readings from Last 3 Encounters:  04/07/21 199 lb 9.6 oz (90.5 kg)  03/31/21 200 lb 4.8 oz (90.9 kg)  03/09/21 200 lb (90.7 kg)     Health Maintenance Due  Topic Date Due   COVID-19 Vaccine (1) Never done   Pneumococcal Vaccine 17-50 Years old (1 - PCV) Never done   HIV Screening  Never done   Hepatitis C Screening  Never done   TETANUS/TDAP  Never done   Zoster Vaccines- Shingrix (1 of 2) Never done   INFLUENZA  VACCINE  03/15/2021    There are no preventive care reminders to display for this patient.  No results found for: TSH Lab Results  Component Value Date  WBC 6.2 11/25/2020   HGB 15.7 11/25/2020   HCT 44.8 11/25/2020   MCV 96 11/25/2020   PLT 258 11/25/2020   Lab Results  Component Value Date   NA 144 11/25/2020   K 4.8 11/25/2020   CO2 22 11/25/2020   GLUCOSE 111 (H) 11/25/2020   BUN 14 11/25/2020   CREATININE 1.00 11/25/2020   BILITOT 0.2 11/25/2020   ALKPHOS 70 11/25/2020   AST 15 11/25/2020   ALT 15 11/25/2020   PROT 7.1 11/25/2020   ALBUMIN 4.5 11/25/2020   CALCIUM 9.3 11/25/2020   EGFR 91 11/25/2020   Lab Results  Component Value Date   CHOL 230 (H) 03/31/2021   Lab Results  Component Value Date   HDL 47 03/31/2021   Lab Results  Component Value Date   LDLCALC 137 (H) 03/31/2021   Lab Results  Component Value Date   TRIG 257 (H) 03/31/2021   Lab Results  Component Value Date   CHOLHDL 4.9 03/31/2021   Lab Results  Component Value Date   HGBA1C 6.1 (H) 03/31/2021      Assessment & Plan:    1. Elevated lipids -The 10-year ASCVD risk score Mikey Bussing DC Jr., et al., 2013) is: 12.9%   Values used to calculate the score:     Age: 39 years     Sex: Male     Is Non-Hispanic African American: No     Diabetic: No     Tobacco smoker: Yes     Systolic Blood Pressure: 854 mmHg     Is BP treated: Yes     HDL Cholesterol: 47 mg/dL     Total Cholesterol: 230 mg/dL He is ASCVD risk is 12.9%, his Crestor was increased to 10 mg daily.  He was encouraged to continue on low-fat/cholesterol diet and exercise as tolerated. - rosuvastatin (CRESTOR) 10 MG tablet; Take 1 tablet (10 mg total) by mouth once daily.  Dispense: 30 tablet; Refill: 2  2. Pre-diabetes -His hemoglobin A1c was 6.1% and he will continue on current medication.  He was encouraged to continue on low-carb/no concentrated sweet diet and exercise as tolerated.     Follow-up: Return in about 3  months (around 07/06/2021), or if symptoms worsen or fail to improve.    Enrica Corliss Jerold Coombe, NP

## 2021-04-07 NOTE — Patient Instructions (Signed)
Heart-Healthy Eating Plan Heart-healthy meal planning includes: Eating less unhealthy fats. Eating more healthy fats. Making other changes in your diet. Talk with your doctor or a diet specialist (dietitian) to create an eating plan that is right for you. What is my plan? Your doctor may recommend an eating plan that includes: Total fat: ______% or less of total calories a day. Saturated fat: ______% or less of total calories a day. Cholesterol: less than _________mg a day. What are tips for following this plan? Cooking Avoid frying your food. Try to bake, boil, grill, or broil it instead. You can also reduce fat by: Removing the skin from poultry. Removing all visible fats from meats. Steaming vegetables in water or broth. Meal planning  At meals, divide your plate into four equal parts: Fill one-half of your plate with vegetables and green salads. Fill one-fourth of your plate with whole grains. Fill one-fourth of your plate with lean protein foods. Eat 4-5 servings of vegetables per day. A serving of vegetables is: 1 cup of raw or cooked vegetables. 2 cups of raw leafy greens. Eat 4-5 servings of fruit per day. A serving of fruit is: 1 medium whole fruit.  cup of dried fruit.  cup of fresh, frozen, or canned fruit.  cup of 100% fruit juice. Eat more foods that have soluble fiber. These are apples, broccoli, carrots, beans, peas, and barley. Try to get 20-30 g of fiber per day. Eat 4-5 servings of nuts, legumes, and seeds per week: 1 serving of dried beans or legumes equals  cup after being cooked. 1 serving of nuts is  cup. 1 serving of seeds equals 1 tablespoon.  General information Eat more home-cooked food. Eat less restaurant, buffet, and fast food. Limit or avoid alcohol. Limit foods that are high in starch and sugar. Avoid fried foods. Lose weight if you are overweight. Keep track of how much salt (sodium) you eat. This is important if you have high blood  pressure. Ask your doctor to tell you more about this. Try to add vegetarian meals each week. Fats Choose healthy fats. These include olive oil and canola oil, flaxseeds, walnuts, almonds, and seeds. Eat more omega-3 fats. These include salmon, mackerel, sardines, tuna, flaxseed oil, and ground flaxseeds. Try to eat fish at least 2 times each week. Check food labels. Avoid foods with trans fats or high amounts of saturated fat. Limit saturated fats. These are often found in animal products, such as meats, butter, and cream. These are also found in plant foods, such as palm oil, palm kernel oil, and coconut oil. Avoid foods with partially hydrogenated oils in them. These have trans fats. Examples are stick margarine, some tub margarines, cookies, crackers, and other baked goods. What foods can I eat? Fruits All fresh, canned (in natural juice), or frozen fruits. Vegetables Fresh or frozen vegetables (raw, steamed, roasted, or grilled). Green salads. Grains Most grains. Choose whole wheat and whole grains most of the time. Rice andpasta, including brown rice and pastas made with whole wheat. Meats and other proteins Lean, well-trimmed beef, veal, pork, and lamb. Chicken and turkey without skin. All fish and shellfish. Wild duck, rabbit, pheasant, and venison. Egg whites or low-cholesterol egg substitutes. Dried beans, peas, lentils, and tofu. Seedsand most nuts. Dairy Low-fat or nonfat cheeses, including ricotta and mozzarella. Skim or 1% milk that is liquid, powdered, or evaporated. Buttermilk that is made with low-fatmilk. Nonfat or low-fat yogurt. Fats and oils Non-hydrogenated (trans-free) margarines. Vegetable oils, including soybean, sesame,   sunflower, olive, peanut, safflower, corn, canola, and cottonseed. Salad dressings or mayonnaisemade with a vegetable oil. Beverages Mineral water. Coffee and tea. Diet carbonated beverages. Sweets and desserts Sherbet, gelatin, and fruit ice. Small  amounts of dark chocolate. Limit all sweets and desserts. Seasonings and condiments All seasonings and condiments. The items listed above may not be a complete list of foods and drinks you can eat. Contact a dietitian for more options. What foods should I avoid? Fruits Canned fruit in heavy syrup. Fruit in cream or butter sauce. Fried fruit. Limitcoconut. Vegetables Vegetables cooked in cheese, cream, or butter sauce. Fried vegetables. Grains Breads that are made with saturated or trans fats, oils, or whole milk. Croissants. Sweet rolls. Donuts. High-fat crackers,such as cheese crackers. Meats and other proteins Fatty meats, such as hot dogs, ribs, sausage, bacon, rib-eye roast or steak. High-fat deli meats, such as salami and bologna. Caviar. Domestic duck andgoose. Organ meats, such as liver. Dairy Cream, sour cream, cream cheese, and creamed cottage cheese. Whole-milk cheeses. Whole or 2% milk that is liquid, evaporated, or condensed. Whole buttermilk. Cream sauce or high-fat cheese sauce. Yogurt that is made fromwhole milk. Fats and oils Meat fat, or shortening. Cocoa butter, hydrogenated oils, palm oil, coconut oil, palm kernel oil. Solid fats and shortenings, including bacon fat, salt pork, lard, and butter. Nondairy cream substitutes. Salad dressings with cheeseor sour cream. Beverages Regular sodas and juice drinks with added sugar. Sweets and desserts Frosting. Pudding. Cookies. Cakes. Pies. Milk chocolate or white chocolate.Buttered syrups. Full-fat ice cream or ice cream drinks. The items listed above may not be a complete list of foods and drinks to avoid. Contact a dietitian for more information. Summary Heart-healthy meal planning includes eating less unhealthy fats, eating more healthy fats, and making other changes in your diet. Eat a balanced diet. This includes fruits and vegetables, low-fat or nonfat dairy, lean protein, nuts and legumes, whole grains, and heart-healthy  oils and fats. This information is not intended to replace advice given to you by your health care provider. Make sure you discuss any questions you have with your healthcare provider. Document Revised: 10/05/2017 Document Reviewed: 09/08/2017 Elsevier Patient Education  2022 Elsevier Inc.  

## 2021-04-07 NOTE — Progress Notes (Signed)
Office Visit Note   Patient: Jason Livingston           Date of Birth: [birth year]           MRN: [id number] Visit Date: 03/30/2021              Requested by: Langston Reusing, NP [place],  [place] [id number] PCP: Langston Reusing, NP  Chief Complaint  Patient presents with   Lower Back - Pain      HPI: Patient is a 55 year old gentleman who presents in follow-up for chronic lower back pain with cramping that runs down the right leg.  Patient states is getting worse with time.  Patient states the pain gets worse with prolonged walking.  Assessment & Plan: Visit Diagnoses:  1. Radicular pain of right lower extremity     Plan: We will schedule an appoint with Dr. Ernestina Patches for evaluation for epidural steroid injections.  Patient is given a prescription for physical therapy for his bilateral shoulder impingement symptoms.  Follow-Up Instructions: Return if symptoms worsen or fail to improve.   Ortho Exam  Patient is alert, oriented, no adenopathy, well-dressed, normal affect, normal respiratory effort. Examination patient has a negative straight leg raise bilaterally motor strength is 5/5 in all motor groups of both lower extremities he is a smoker.  Review of the MRI scan shows degenerative changes at L5-S1 with narrowing to the right.  Patient also has bilateral narrowing at L3-4 and L4-5 there is no significant spinal stenosis.  Imaging: No results found. No images are attached to the encounter.  Labs: Lab Results  Component Value Date   HGBA1C 6.1 (H) 03/31/2021   HGBA1C 6.2 (H) 11/25/2020     Lab Results  Component Value Date   ALBUMIN 4.5 11/25/2020    No results found for: MG No results found for: VD25OH  No results found for: PREALBUMIN CBC EXTENDED Latest Ref Rng & Units 11/25/2020  WBC 3.4 - 10.8 x10E3/uL 6.2  RBC 4.14 - 5.80 x10E6/uL 4.67  HGB 13.0 - 17.7 g/dL 15.7  HCT 37.5 - 51.0 % 44.8  PLT 150 - 450 x10E3/uL 258   NEUTROABS 1.4 - 7.0 x10E3/uL 3.6  LYMPHSABS 0.7 - 3.1 x10E3/uL 2.0     There is no height or weight on file to calculate BMI.  Orders:  Orders Placed This Encounter  Procedures   Ambulatory referral to Physical Medicine Rehab   No orders of the defined types were placed in this encounter.    Procedures: No procedures performed  Clinical Data: No additional findings.  ROS:  All other systems negative, except as noted in the HPI. Review of Systems  Objective: Vital Signs: There were no vitals taken for this visit.  Specialty Comments:  No specialty comments available.  PMFS History: Patient Active Problem List   Diagnosis Date Noted   Muscle cramps 12/17/2020   Pre-diabetes 12/17/2020   Elevated lipids 12/17/2020   Encounter to establish care 11/25/2020   Essential hypertension 11/25/2020   History of asthma 11/25/2020   Anxiety and depression 11/25/2020   History of insomnia 11/25/2020   History of gastroesophageal reflux (GERD) 11/25/2020   Smoking 11/25/2020   Past Medical History:  Diagnosis Date   Asthma    GERD (gastroesophageal reflux disease)    Hypertension    Pre-diabetes    Sleep apnea     Family History  Problem Relation Age of Onset   Other Mother  unknown medical history   Other Father        unknwon medical history   Hypertension Maternal Grandmother    Heart disease Maternal Grandfather    Other Cousin        kidney transplant    Past Surgical History:  Procedure Laterality Date   COLONOSCOPY N/A 03/09/2021   Procedure: COLONOSCOPY;  Surgeon: Jonathon Bellows, MD;  Location: [facility] ENDOSCOPY;  Service: Gastroenterology;  Laterality: N/A;   MANDIBLE FRACTURE SURGERY     Social History   Occupational History   Not on file  Tobacco Use   Smoking status: Every Day    Packs/day: 1.00    Years: 34.00    Pack years: 34.00    Types: Cigarettes   Smokeless tobacco: Former    Quit date: 2012  Vaping Use   Vaping Use: Never used   Substance and Sexual Activity   Alcohol use: Yes    Alcohol/week: 14.0 standard drinks    Types: 14 Cans of beer per week    Comment: 2 40oz beers at night to sleep, 02/18/21 20oz with dinner everynight   Drug use: Not Currently    Types: Cocaine, Marijuana, LSD    Comment: used in his 20's, has not used in 30 years   Sexual activity: Not Currently

## 2021-04-12 ENCOUNTER — Telehealth: Payer: Self-pay | Admitting: Physical Medicine and Rehabilitation

## 2021-04-12 ENCOUNTER — Ambulatory Visit: Payer: Medicaid Other | Admitting: Physical Medicine and Rehabilitation

## 2021-04-12 NOTE — Telephone Encounter (Signed)
Pt states he needs to resch his appt due to lack of transportation for today's 2 pm appt with [facility]. The best call back number for him is [phone].

## 2021-04-12 NOTE — Telephone Encounter (Signed)
Called patient and rescheduled appointment

## 2021-04-14 ENCOUNTER — Ambulatory Visit: Payer: Medicaid Other | Admitting: Licensed Clinical Social Worker

## 2021-04-14 ENCOUNTER — Other Ambulatory Visit: Payer: Self-pay

## 2021-04-14 ENCOUNTER — Ambulatory Visit: Payer: Self-pay | Admitting: Licensed Clinical Social Worker

## 2021-04-14 DIAGNOSIS — F32A Depression, unspecified: Secondary | ICD-10-CM

## 2021-04-14 DIAGNOSIS — Z87898 Personal history of other specified conditions: Secondary | ICD-10-CM

## 2021-04-14 NOTE — BH Specialist Note (Signed)
Integrated Behavioral Health Follow Up Telephone Visit  MRN: [id number] Name: [name]  Total time: 60 minutes  Types of Service: Telephone visit Patient consents to telephone visit and 2 patient identifiers were used to identify patient   Interpretor:No. Interpretor Name and Language: N/A  Subjective: Jason Livingston is a 55 y.o. male accompanied by  himself Patient was referred by Carlyon Shadow, NP for mental health. Patient reports the following symptoms/concerns: The patient stated that he has been doing well since his last follow-up appointment. He stated that he has not been able to work due to leg and back pain. He discussed other health and financial stressors impacting his life. He shared that he has been working with a guy to keep the house clean and takes turns cooking which has helped him feel connected to others. He shared that he is playing his guitar, and going on occasional short walks for self care. [name] reports that he takes mirtazapine 15 MG before bedtime nightly. The patient feels his mood has overall improved. He stated that he is sleeping better and falls asleep within 30 minutes of going to bed. He shared that he still feels groggy when he wakes up so he is planning to take his medication earlier in the evening. The patient denied any suicidal or homicidal thoughts. Duration of problem: Years; Severity of problem: mild  Objective: Mood: Euthymic and Affect: Appropriate Risk of harm to self or others: No plan to harm self or others  Life Context: Family and Social: see above School/Work: see above Self-Care: see above Life Changes: see above  Patient and/or Family's Strengths/Protective Factors: Concrete supports in place (healthy food, safe environments, etc.)  Goals Addressed: Patient will:  Reduce symptoms of: anxiety, depression, insomnia, and stress   Increase knowledge and/or ability of: coping skills, healthy habits, self-management skills, and  stress reduction   Demonstrate ability to: Increase healthy adjustment to current life circumstances and Increase adequate support systems for patient/family  Progress towards Goals: Ongoing  Interventions: Interventions utilized:  CBT Cognitive Behavioral Therapy was utilized by the clinician during today's follow up session. The clinician processed with the patient how they have been doing since the last follow-up session. The clinician provided a space for the patient to ventilate their frustrations regarding their current life circumstances. Clinician measured the patient's anxiety and depression on a numerical scale. Clinician encouraged the patient to focus on the positives in his life verses the negatives. Clinician explained that if she found resources that may benefit the patient she would e-mail them to him.   Standardized Assessments completed: GAD-7 and PHQ 9 PHQ-9   7 GAD-7   3  Assessment: Patient currently experiencing see above.   Patient may benefit from see above.  Plan: Follow up with behavioral health clinician on : 04/28/2021 at 5:00 PM Behavioral recommendations: Referral(s): [place] (In Clinic) "From scale of 1-10, how likely are you to follow plan?":   Lesli Albee, LCSWA

## 2021-04-15 NOTE — Progress Notes (Signed)
This encounter was created in error - please disregard.

## 2021-04-16 ENCOUNTER — Other Ambulatory Visit: Payer: Self-pay | Admitting: Gerontology

## 2021-04-16 ENCOUNTER — Other Ambulatory Visit: Payer: Self-pay

## 2021-04-16 DIAGNOSIS — Z8709 Personal history of other diseases of the respiratory system: Secondary | ICD-10-CM

## 2021-04-20 ENCOUNTER — Encounter: Payer: Self-pay | Admitting: Physical Medicine and Rehabilitation

## 2021-04-20 ENCOUNTER — Other Ambulatory Visit: Payer: Self-pay

## 2021-04-20 ENCOUNTER — Ambulatory Visit (INDEPENDENT_AMBULATORY_CARE_PROVIDER_SITE_OTHER): Payer: Self-pay | Admitting: Physical Medicine and Rehabilitation

## 2021-04-20 VITALS — BP 118/77 | HR 96

## 2021-04-20 DIAGNOSIS — M5116 Intervertebral disc disorders with radiculopathy, lumbar region: Secondary | ICD-10-CM

## 2021-04-20 DIAGNOSIS — M48061 Spinal stenosis, lumbar region without neurogenic claudication: Secondary | ICD-10-CM

## 2021-04-20 DIAGNOSIS — M5416 Radiculopathy, lumbar region: Secondary | ICD-10-CM

## 2021-04-20 DIAGNOSIS — R208 Other disturbances of skin sensation: Secondary | ICD-10-CM

## 2021-04-20 MED ORDER — FLUTICASONE FUROATE-VILANTEROL 200-25 MCG/INH IN AEPB
1.0000 | INHALATION_SPRAY | Freq: Every day | RESPIRATORY_TRACT | 1 refills | Status: DC
  Filled 2021-04-20: qty 60, 60d supply, fill #0
  Filled 2021-05-20: qty 120, 60d supply, fill #0

## 2021-04-20 NOTE — Progress Notes (Signed)
Pt state lower back pain that travels down his right leg. Pt state walking, standing and laying down makes the pain worse. Pt state he takes pain meds to help ease his pain.   Numeric Pain Rating Scale and Functional Assessment Average Pain 10 Pain Right Now 2 My pain is intermittent, burning, dull, stabbing, tingling, and aching Pain is worse with: walking, standing, and some activites Pain improves with: medication   In the last MONTH (on 0-10 scale) has pain interfered with the following?  1. General activity like being  able to carry out your everyday physical activities such as walking, climbing stairs, carrying groceries, or moving a chair?  Rating(7)  2. Relation with others like being able to carry out your usual social activities and roles such as  activities at home, at work and in your community. Rating(8)  3. Enjoyment of life such that you have  been bothered by emotional problems such as feeling anxious, depressed or irritable?  Rating(9)

## 2021-04-20 NOTE — Progress Notes (Signed)
Jason Livingston - 55 y.o. male MRN [id number]  Date of birth: 30-Dec-1965  Office Visit Note: Visit Date: 04/20/2021 PCP: Langston Reusing, NP Referred by: Langston Reusing, NP  Subjective: Chief Complaint  Patient presents with   Lower Back - Pain   Right Leg - Pain   HPI: Jason Livingston is a 55 y.o. male who comes in today Per the request of Dr. Meridee Score for evaluation of chronic, worsening and severe bilateral lower back radiating to posterior legs and calves, right greater than left. Patient reports pain has been chronic for 1 year. Patient reports pain is exacerbated by walking, prolonged standing and activity. Patient describes pain as shooting sensation and currently rates as 2 out of 10. Patient reports some relief of pain with stretching exercises. Tylenol, Voltaren gel and rest. Patient's recent lumbar MRI exhibits right lateral recess narrowing at L5-S1 and moderate bilateral foraminal narrowing at this same level. No high grade stenosis noted. Patient was recently seen by Dr. Meridee Score, whom also placed a referral to physical therapy for bilateral shoulder issues. Patient denies focal weakness. Patient denies recent trauma or falls.   Patient's course is complicated by significant medical history which includes anxiety, depression, asthma, emphysema, hypertension, tobacco abuse and pre-diabetes.  Review of Systems  Musculoskeletal:  Positive for back pain.  Neurological:  Negative for sensory change, focal weakness and weakness.  All other systems reviewed and are negative. Otherwise per HPI.  Assessment & Plan: Visit Diagnoses:    ICD-10-CM   1. Lumbar radiculopathy  M54.16 Ambulatory referral to Physical Medicine Rehab    2. 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 Dysesthesia  R20.8        Plan: Findings:  Chronic, worsening and severe bilateral lower back  pain radiating to posterior leg and calves, right greater than left. Patient continues to have severe pain despite good conservative therapies. Patient's clinical presentation and exam are consistent with S1 nerve pattern due to pain radiating to posterior legs and calves. We believe the next step is to perform a diagnostic and hopefully therapeutic bilateral L5-S1 transforaminal epidural steroid injection. If patient gets good relief from injection we would monitor closely, however if his pain continues we would consider physical therapy and medications. Patient instructed to take 500 mg of Tylenol 4 times a day for pain. Patient encouraged to stay active as tolerated and to continue performing home stretching exercises. We feel confident that we can get patient scheduled for injection quickly. No red flag symptoms noted upon exam today.    Meds & Orders: No orders of the defined types were placed in this encounter.   Orders Placed This Encounter  Procedures   Ambulatory referral to Physical Medicine Rehab    Follow-up: Return in about 1 week (around 04/27/2021) for Bilateral L5-S1 transforaminal epidural steroid injection.   Procedures: No procedures performed      Clinical History: MRI LUMBAR SPINE WITHOUT CONTRAST   TECHNIQUE: Multiplanar, multisequence MR imaging of the lumbar spine was performed. No intravenous contrast was administered.   COMPARISON:  Radiographs February 04, 2021.   FINDINGS: Segmentation:  Standard.   Alignment: Straightening of the lumbar lordosis. Minimal retrolisthesis of L3 over L4.   Vertebrae: No fracture, evidence of discitis, or bone lesion. Endplate degenerative changes at L3-4, L4-5 and L5-S1.   Conus medullaris and cauda equina: Conus extends  to the L1-2 level. Conus and cauda equina appear normal.   Paraspinal and other soft tissues: Negative.   Disc levels:   T12-L1: No spinal canal or neural foraminal stenosis.   L1-2: No spinal canal or  neural foraminal stenosis.   L2-3: Small left central disc protrusion. No significant spinal canal or neural foraminal stenosis.   L3-4: Loss of disc height, disc bulge and mild facet degenerative changes resulting in mild bilateral neural foraminal narrowing. No significant spinal canal stenosis.   L4-5: Disc bulge, mild facet degenerative changes with mild bilateral joint effusion and mild ligamentum flavum redundancy resulting in mild bilateral neural foraminal narrowing. No significant spinal canal stenosis.   L5-S1: Loss of disc height, right asymmetric disc bulge and mild facet degenerative changes resulting in mild narrowing of the right subarticular zone and moderate bilateral neural foraminal narrowing.   IMPRESSION: 1. Degenerative changes of the lumbar spine, more pronounced at L5-S1 where there is narrowing of the right subarticular zone and moderate bilateral neural foraminal narrowing. 2. Mild bilateral neural foraminal narrowing at L3-4 and L4-5. 3. No high-grade spinal canal stenosis.     Electronically Signed   By: Pedro Earls M.D.   On: 03/23/2021 17:15   He reports that he has been smoking cigarettes. He has a 34.00 pack-year smoking history. He quit smokeless tobacco use about 10 years ago.  Recent Labs    11/25/20 1110 03/31/21 1234  HGBA1C 6.2* 6.1*    Objective:  VS:  HT:    WT:   BMI:     BP:118/77  HR:96bpm  TEMP: ( )  RESP:  Physical Exam HENT:     Head: Normocephalic and atraumatic.     Right Ear: Tympanic membrane normal.     Left Ear: Tympanic membrane normal.     Nose: Nose normal.     Mouth/Throat:     Mouth: Mucous membranes are moist.  Eyes:     Pupils: Pupils are equal, round, and reactive to light.  Cardiovascular:     Rate and Rhythm: Normal rate.     Pulses: Normal pulses.  Pulmonary:     Effort: Pulmonary effort is normal.  Abdominal:     General: Abdomen is flat. There is no distension.   Musculoskeletal:     Cervical back: Normal range of motion and neck supple.     Comments: Pt rises from seated position to standing without difficulty. Good lumbar range of motion. Strong distal strength without clonus, no pain upon palpation of greater trochanters. Sensation intact bilaterally. Dysesthesias noted to S1 dermatome on the right. Walks independently, gait steady.      Skin:    General: Skin is warm and dry.     Capillary Refill: Capillary refill takes less than 2 seconds.  Neurological:     General: No focal deficit present.     Mental Status: He is alert.    Ortho Exam  Imaging: No results found.  Past Medical/Family/Surgical/Social History: Medications & Allergies reviewed per EMR, new medications updated. Patient Active Problem List   Diagnosis Date Noted   Muscle cramps 12/17/2020   Pre-diabetes 12/17/2020   Elevated lipids 12/17/2020   Encounter to establish care 11/25/2020   Essential hypertension 11/25/2020   History of asthma 11/25/2020   Anxiety and depression 11/25/2020   History of insomnia 11/25/2020   History of gastroesophageal reflux (GERD) 11/25/2020   Smoking 11/25/2020   Past Medical History:  Diagnosis Date   Asthma  GERD (gastroesophageal reflux disease)    Hypertension    Pre-diabetes    Sleep apnea    Family History  Problem Relation Age of Onset   Other Mother        unknown medical history   Other Father        unknwon medical history   Hypertension Maternal Grandmother    Heart disease Maternal Grandfather    Other Cousin        kidney transplant   Past Surgical History:  Procedure Laterality Date   COLONOSCOPY N/A 03/09/2021   Procedure: COLONOSCOPY;  Surgeon: Jonathon Bellows, MD;  Location: [facility] ENDOSCOPY;  Service: Gastroenterology;  Laterality: N/A;   MANDIBLE FRACTURE SURGERY     Social History   Occupational History   Not on file  Tobacco Use   Smoking status: Every Day    Packs/day: 1.00    Years: 34.00     Pack years: 34.00    Types: Cigarettes   Smokeless tobacco: Former    Quit date: 2012  Vaping Use   Vaping Use: Never used  Substance and Sexual Activity   Alcohol use: Yes    Alcohol/week: 14.0 standard drinks    Types: 14 Cans of beer per week    Comment: 2 40oz beers at night to sleep, 02/18/21 20oz with dinner everynight   Drug use: Not Currently    Types: Cocaine, Marijuana, LSD    Comment: used in his 20's, has not used in 30 years   Sexual activity: Not Currently

## 2021-04-22 ENCOUNTER — Ambulatory Visit: Payer: Self-pay

## 2021-04-22 ENCOUNTER — Other Ambulatory Visit: Payer: Self-pay

## 2021-04-22 ENCOUNTER — Ambulatory Visit (INDEPENDENT_AMBULATORY_CARE_PROVIDER_SITE_OTHER): Payer: Self-pay | Admitting: Physical Medicine and Rehabilitation

## 2021-04-22 ENCOUNTER — Encounter: Payer: Self-pay | Admitting: Physical Medicine and Rehabilitation

## 2021-04-22 VITALS — BP 138/88 | HR 80

## 2021-04-22 DIAGNOSIS — M5416 Radiculopathy, lumbar region: Secondary | ICD-10-CM

## 2021-04-22 MED ORDER — METHYLPREDNISOLONE ACETATE 80 MG/ML IJ SUSP
80.0000 mg | Freq: Once | INTRAMUSCULAR | Status: AC
  Administered 2021-04-22: 80 mg

## 2021-04-22 NOTE — Patient Instructions (Signed)

## 2021-04-22 NOTE — Progress Notes (Signed)
Pt state lower back pain that travels to both legs but mostly his right side. Pt state walking makes the pain worse. Pt state he takes pain meds to help ease his pain.  Numeric Pain Rating Scale and Functional Assessment Average Pain 4   In the last MONTH (on 0-10 scale) has pain interfered with the following?  1. General activity like being  able to carry out your everyday physical activities such as walking, climbing stairs, carrying groceries, or moving a chair?  Rating(10)   +Driver, -BT, -Dye Allergies.

## 2021-04-25 ENCOUNTER — Other Ambulatory Visit: Payer: Self-pay

## 2021-04-25 ENCOUNTER — Emergency Department: Payer: Medicaid Other

## 2021-04-25 ENCOUNTER — Emergency Department
Admission: EM | Admit: 2021-04-25 | Discharge: 2021-04-25 | Disposition: A | Discharge status: Home / Self Care | Payer: Medicaid Other | Attending: Emergency Medicine | Admitting: Emergency Medicine

## 2021-04-25 DIAGNOSIS — I10 Essential (primary) hypertension: Secondary | ICD-10-CM | POA: Insufficient documentation

## 2021-04-25 DIAGNOSIS — Z79899 Other long term (current) drug therapy: Secondary | ICD-10-CM | POA: Insufficient documentation

## 2021-04-25 DIAGNOSIS — Z7984 Long term (current) use of oral hypoglycemic drugs: Secondary | ICD-10-CM | POA: Insufficient documentation

## 2021-04-25 DIAGNOSIS — F17210 Nicotine dependence, cigarettes, uncomplicated: Secondary | ICD-10-CM | POA: Insufficient documentation

## 2021-04-25 DIAGNOSIS — I471 Supraventricular tachycardia: Secondary | ICD-10-CM | POA: Insufficient documentation

## 2021-04-25 DIAGNOSIS — J45909 Unspecified asthma, uncomplicated: Secondary | ICD-10-CM | POA: Insufficient documentation

## 2021-04-25 DIAGNOSIS — F1010 Alcohol abuse, uncomplicated: Secondary | ICD-10-CM | POA: Insufficient documentation

## 2021-04-25 LAB — CBC
HCT: 42.9 % (ref 39.0–52.0)
Hemoglobin: 15 g/dL (ref 13.0–17.0)
MCH: 35.2 pg — ABNORMAL HIGH (ref 26.0–34.0)
MCHC: 35 g/dL (ref 30.0–36.0)
MCV: 100.7 fL — ABNORMAL HIGH (ref 80.0–100.0)
Platelets: 306 10*3/uL (ref 150–400)
RBC: 4.26 MIL/uL (ref 4.22–5.81)
RDW: 13 % (ref 11.5–15.5)
WBC: 9.4 10*3/uL (ref 4.0–10.5)
nRBC: 0 % (ref 0.0–0.2)

## 2021-04-25 LAB — BASIC METABOLIC PANEL
Anion gap: 13 (ref 5–15)
BUN: 13 mg/dL (ref 6–20)
CO2: 21 mmol/L — ABNORMAL LOW (ref 22–32)
Calcium: 9 mg/dL (ref 8.9–10.3)
Chloride: 108 mmol/L (ref 98–111)
Creatinine, Ser: 0.59 mg/dL — ABNORMAL LOW (ref 0.61–1.24)
GFR, Estimated: >60 mL/min (ref >60)
Glucose, Bld: 132 mg/dL — ABNORMAL HIGH (ref 70–99)
Potassium: 3.7 mmol/L (ref 3.5–5.1)
Sodium: 142 mmol/L (ref 135–145)

## 2021-04-25 LAB — D-DIMER, QUANTITATIVE: D-Dimer, Quant: 0.38 ug/mL-FEU (ref 0.00–0.50)

## 2021-04-25 LAB — HEPATIC FUNCTION PANEL
ALT: 31 U/L (ref 0–44)
AST: 21 U/L (ref 15–41)
Albumin: 4.2 g/dL (ref 3.5–5.0)
Alkaline Phosphatase: 48 U/L (ref 38–126)
Bilirubin, Direct: <0.1 mg/dL (ref 0.0–0.2)
Total Bilirubin: 0.5 mg/dL (ref 0.3–1.2)
Total Protein: 7.2 g/dL (ref 6.5–8.1)

## 2021-04-25 LAB — TSH: TSH: 2.34 u[IU]/mL (ref 0.350–4.500)

## 2021-04-25 LAB — TROPONIN I (HIGH SENSITIVITY)
Troponin I (High Sensitivity): 14 ng/L (ref <18)
Troponin I (High Sensitivity): 33 ng/L — ABNORMAL HIGH (ref <18)

## 2021-04-25 LAB — MAGNESIUM: Magnesium: 2 mg/dL (ref 1.7–2.4)

## 2021-04-25 MED ORDER — SODIUM CHLORIDE 0.9 % IV BOLUS (SEPSIS)
1000.0000 mL | Freq: Once | INTRAVENOUS | Status: AC
  Administered 2021-04-25: 1000 mL via INTRAVENOUS

## 2021-04-25 MED ORDER — VITAMIN B-1 100 MG PO TABS
100.0000 mg | ORAL_TABLET | Freq: Every day | ORAL | 3 refills | Status: DC
  Filled 2021-04-25: qty 90, 90d supply, fill #0
  Filled 2021-12-09: qty 90, 90d supply, fill #1

## 2021-04-25 MED ORDER — THIAMINE HCL 100 MG/ML IJ SOLN
100.0000 mg | Freq: Once | INTRAMUSCULAR | Status: AC
  Administered 2021-04-25: 100 mg via INTRAVENOUS
  Filled 2021-04-25: qty 2

## 2021-04-25 MED ORDER — ACETAMINOPHEN 500 MG PO TABS: 1000.0000 mg | ORAL_TABLET | Freq: Once | ORAL | Status: DC

## 2021-04-25 MED ORDER — NEPHRO-VITE RX 1 MG PO TABS
1.0000 | ORAL_TABLET | Freq: Every day | ORAL | 3 refills | Status: DC
  Filled 2021-04-25: qty 90, 90d supply, fill #0
  Filled 2021-04-28: qty 100, 100d supply, fill #0
  Filled 2021-12-09: qty 100, 100d supply, fill #1

## 2021-04-25 MED ORDER — ADENOSINE 6 MG/2ML IV SOLN
INTRAVENOUS | Status: AC
  Filled 2021-04-25: qty 2

## 2021-04-25 MED ORDER — ADENOSINE 6 MG/2ML IV SOLN: 6.0000 mg | Freq: Once | INTRAVENOUS | Status: DC

## 2021-04-25 NOTE — ED Triage Notes (Signed)
PT arrives via [facility] from home, report from medic,  with onset of CP and SOB tonight. On EMS arrival, pt was in SVT, hr 220's, 10/10 chest pain, given Adenosine '6mg'$  en route to the IV in the left AC, 16 gauge, converted to ST 110-120's. 157/101, pain free at this time.

## 2021-04-25 NOTE — ED Notes (Signed)
Pt had brief moment where he started to feel lightheaded, hr noted to be in 190's. EDP notified, order for adenosine. Pt converted on his own to the low 100's prior to administration

## 2021-04-25 NOTE — ED Triage Notes (Signed)
Pt says he woke up with cp and sob tonight. Felt like his heart rate was high, he has had this before, usually it stops, and it did not tonight. ETOH tonight. Feels soreness in his central chest.

## 2021-04-25 NOTE — ED Provider Notes (Signed)
[facility] Emergency Department Provider Note  ____________________________________________   Event Date/Time   First MD Initiated Contact with Patient 04/25/21 0451     (approximate)  I have reviewed the triage vital signs and the nursing notes.   HISTORY  Chief Complaint Chest Pain    HPI Jason Livingston is a 55 y.o. male with history of hypertension, prediabetes, alcohol abuse who presents to the emergency department with complaints of palpitations, chest pain and shortness of breath that woke her from sleep at 1 AM.  Was found to be in SVT with EMS.  Was converted with 6 mg of adenosine into a sinus tachycardia.  States he is feeling better.  States he has had these episodes before but has never had to come to the hospital for them.  States he will normally hold his breath and then the symptoms resolved.  Denies any recent fevers, cough, vomiting, diarrhea.  States he does drink alcohol every day.  States he was drinking four 40 ounce beers a day but has cut back to two a day.  Denies any drug use.  No other stimulant use.  Does not drink caffeine.  No history of CAD or arrhythmia.  Does not have a cardiologist.  No history of PE, DVT, exogenous estrogen use, recent fractures, surgery, trauma, hospitalization, prolonged travel or other immobilization. No lower extremity swelling or pain. No calf tenderness.        Past Medical History:  Diagnosis Date   Asthma    GERD (gastroesophageal reflux disease)    Hypertension    Pre-diabetes    Sleep apnea     Patient Active Problem List   Diagnosis Date Noted   Muscle cramps 12/17/2020   Pre-diabetes 12/17/2020   Elevated lipids 12/17/2020   Encounter to establish care 11/25/2020   Essential hypertension 11/25/2020   History of asthma 11/25/2020   Anxiety and depression 11/25/2020   History of insomnia 11/25/2020   History of gastroesophageal reflux (GERD) 11/25/2020   Smoking 11/25/2020    Past  Surgical History:  Procedure Laterality Date   COLONOSCOPY N/A 03/09/2021   Procedure: COLONOSCOPY;  Surgeon: Jonathon Bellows, MD;  Location: [facility] ENDOSCOPY;  Service: Gastroenterology;  Laterality: N/A;   MANDIBLE FRACTURE SURGERY      Prior to Admission medications   Medication Sig Start Date End Date Taking? Authorizing Provider  B Complex-C-Folic Acid (B COMPLEX-VITAMIN C-FOLIC ACID) 1 MG tablet Take 1 tablet by mouth daily with breakfast. 04/25/21  Yes Addisynn Vassell, Delice Bison, DO  thiamine 100 MG tablet Take 1 tablet (100 mg total) by mouth daily. 04/25/21  Yes Antonya Leeder, Cyril Mourning N, DO  albuterol (VENTOLIN HFA) 108 (90 Base) MCG/ACT inhaler Inhale 2 puffs into the lungs every 6 (six) hours as needed for wheezing or shortness of breath. 02/18/21   Iloabachie, Chioma E, NP  diclofenac Sodium (VOLTAREN) 1 % GEL Apply 2 g topically 4 (four) times daily. 12/17/20   Iloabachie, Chioma E, NP  fluticasone furoate-vilanterol (BREO ELLIPTA) 200-25 MCG/INH AEPB Inhale 1 puff into the lungs once daily at [facility]. 04/20/21   Iloabachie, Chioma E, NP  losartan (COZAAR) 25 MG tablet Take 1 tablet (25 mg total) by mouth once daily. 02/18/21   Iloabachie, Chioma E, NP  Melatonin-Theanine (MELATONIN FORTE/L-THEANINE PO) Take by mouth. Take 2 gummies by mouth 30 minutes before bedtime.    [provider]  metFORMIN (GLUCOPHAGE-XR) 500 MG 24 hr tablet Take 1 tablet (500 mg total) by mouth daily with breakfast.  02/18/21   Iloabachie, Chioma E, NP  mirtazapine (REMERON) 15 MG tablet Take 0.5 tablets (7.5 mg total) by mouth once daily at bedtime. 03/25/21   Iloabachie, Chioma E, NP  omeprazole (PRILOSEC) 20 MG capsule Take 1 capsule (20 mg total) by mouth once daily. 02/18/21   Iloabachie, Chioma E, NP  rosuvastatin (CRESTOR) 10 MG tablet Take 1 tablet (10 mg total) by mouth once daily. 04/07/21   Iloabachie, Chioma E, NP  budesonide-formoterol (SYMBICORT) 160-4.5 MCG/ACT inhaler Inhale 2 puffs into the lungs 2 (two) times daily. 02/23/21  02/24/21  Iloabachie, Chioma E, NP  traZODone (DESYREL) 100 MG tablet TAKE (1/2) TO (1) TABLET  BY MOUTH ONCE DAILY AT BEDTIME AS NEEDED. Patient not taking: Reported on 01/21/2021 12/18/20 01/25/21      Allergies Patient has no known allergies.  Family History  Problem Relation Age of Onset   Other Mother        unknown medical history   Other Father        unknwon medical history   Hypertension Maternal Grandmother    Heart disease Maternal Grandfather    Other Cousin        kidney transplant    Social History Social History   Tobacco Use   Smoking status: Every Day    Packs/day: 1.00    Years: 34.00    Pack years: 34.00    Types: Cigarettes   Smokeless tobacco: Former    Quit date: 2012  Vaping Use   Vaping Use: Never used  Substance Use Topics   Alcohol use: Yes    Alcohol/week: 14.0 standard drinks    Types: 14 Cans of beer per week    Comment: 2 40oz beers at night to sleep, 02/18/21 20oz with dinner everynight   Drug use: Not Currently    Types: Cocaine, Marijuana, LSD    Comment: used in his 20's, has not used in 30 years    Review of Systems Constitutional: No fever. Eyes: No visual changes. ENT: No sore throat. Cardiovascular: +chest pain. Respiratory: +shortness of breath. Gastrointestinal: No nausea, vomiting, diarrhea. Genitourinary: Negative for dysuria. Musculoskeletal: Negative for back pain. Skin: Negative for rash. Neurological: Negative for focal weakness or numbness.  ____________________________________________   PHYSICAL EXAM:  VITAL SIGNS: ED Triage Vitals  Enc Vitals Group     BP 04/25/21 0442 (!) 136/97     Pulse Rate 04/25/21 0442 (!) 110     Resp 04/25/21 0442 (!) 24     Temp 04/25/21 0442 97.6 F (36.4 C)     Temp Source 04/25/21 0442 Oral     SpO2 04/25/21 0442 93 %     Weight 04/25/21 0450 220 lb (99.8 kg)     Height 04/25/21 0450 '6\' 1"'$  (1.854 m)     Head Circumference --      Peak Flow --      Pain Score 04/25/21 0448 0      Pain Loc --      Pain Edu? --      Excl. in [place]? --    CONSTITUTIONAL: Alert and oriented and responds appropriately to questions. Well-appearing; well-nourished HEAD: Normocephalic EYES: Conjunctivae clear, pupils appear equal, EOM appear intact ENT: normal nose; moist mucous membranes NECK: Supple, normal ROM CARD: Regular and tachycardic; S1 and S2 appreciated; no murmurs, no clicks, no rubs, no gallops RESP: Normal chest excursion without splinting or tachypnea; breath sounds clear and equal bilaterally; no wheezes, no rhonchi, no rales, no hypoxia or respiratory  distress, speaking full sentences ABD/GI: Normal bowel sounds; non-distended; soft, non-tender, no rebound, no guarding, no peritoneal signs, no hepatosplenomegaly BACK: The back appears normal EXT: Normal ROM in all joints; no deformity noted, no edema; no cyanosis, no calf tenderness or calf swelling SKIN: Normal color for age and race; warm; no rash on exposed skin NEURO: Moves all extremities equally PSYCH: The patient's mood and manner are appropriate.  ____________________________________________   LABS (all labs ordered are listed, but only abnormal results are displayed)  Labs Reviewed  BASIC METABOLIC PANEL - Abnormal; Notable for the following components:      Result Value   CO2 21 (*)    Glucose, Bld 132 (*)    Creatinine, Ser 0.59 (*)    All other components within normal limits  CBC - Abnormal; Notable for the following components:   MCV 100.7 (*)    MCH 35.2 (*)    All other components within normal limits  HEPATIC FUNCTION PANEL  MAGNESIUM  TSH  D-DIMER, QUANTITATIVE  TROPONIN I (HIGH SENSITIVITY)  TROPONIN I (HIGH SENSITIVITY)   ____________________________________________  EKG   EKG Interpretation  Date/Time:  Sunday April 25 2021 04:43:32 EDT Ventricular Rate:  109 PR Interval:  140 QRS Duration: 121 QT Interval:  328 QTC Calculation: 442 R Axis:   67 Text  Interpretation: Sinus tachycardia Multiple ventricular premature complexes IVCD, consider atypical RBBB Confirmed by Pryor Curia [phone]) on 04/25/2021 4:55:25 AM          ____________________________________________  RADIOLOGY Jessie Foot Gianny Killman, personally viewed and evaluated these images (plain radiographs) as part of my medical decision making, as well as reviewing the written report by the radiologist.  ED MD interpretation: Chest x-ray clear.  No widened mediastinum.  Official radiology report(s): DG Chest Portable 1 View  Result Date: 04/25/2021 CLINICAL DATA:  55 year old male with chest pain and shortness of breath. SVT. EXAM: PORTABLE CHEST 1 VIEW COMPARISON:  Chest CT 12/14/2020. FINDINGS: Portable AP upright view at 0459 hours. Lower lung volumes. Mediastinal contours are stable and within normal limits. Pacer or resuscitation pads project over the left chest. Allowing for portable technique the lungs are clear. No pneumothorax. Visualized tracheal air column is within normal limits. No acute osseous abnormality identified. IMPRESSION: No acute cardiopulmonary abnormality. Electronically Signed   By: Genevie Ann M.D.   On: 04/25/2021 05:28    ____________________________________________   PROCEDURES  Procedure(s) performed (including Critical Care):  .1-3 Lead EKG Interpretation Performed by: Taiya Nutting, Delice Bison, DO Authorized by: Destyn Parfitt, Delice Bison, DO     Interpretation: normal     ECG rate:  86   ECG rate assessment: normal     Rhythm: sinus rhythm     Ectopy: none     Conduction: normal    CRITICAL CARE Performed by: Pryor Curia   Total critical care time: 45 minutes  Critical care time was exclusive of separately billable procedures and treating other patients.  Critical care was necessary to treat or prevent imminent or life-threatening deterioration.  Critical care was time spent personally by me on the following activities: development of treatment plan with  patient and/or surrogate as well as nursing, discussions with consultants, evaluation of patient's response to treatment, examination of patient, obtaining history from patient or surrogate, ordering and performing treatments and interventions, ordering and review of laboratory studies, ordering and review of radiographic studies, pulse oximetry and re-evaluation of patient's condition.  ____________________________________________   INITIAL IMPRESSION / ASSESSMENT AND PLAN / ED COURSE  As part of my medical decision making, I reviewed the following data within the [facility] notes reviewed and incorporated, Labs reviewed , EKG interpreted , Old EKG reviewed, Old chart reviewed, Radiograph reviewed , and Notes from prior ED visits         Patient here with SVT.  It sounds like he has had these episodes before that normally convert with vagal maneuvers.  Tonight he required adenosine.  Currently asymptomatic.  EKG nonischemic.  Episodes could be triggered from his alcohol abuse.  Denies any illicit drug use, stimulant use.  We will continue to monitor closely.  Cardiac labs, electrolytes pending.  ED PROGRESS  Patient had another brief episode of SVT that lasted for approximately 1 minute.  Patient broke out of this episode spontaneously and did not require any medications.   Patient's labs today are reassuring.  Patient has normal hemoglobin, electrolytes, TSH, D-dimer.  Chest x-ray clear.  Continues to be in a sinus rhythm and asymptomatic.  We will have patient follow-up with cardiology as an outpatient.  Recommended that he abstain from alcohol use.  Will discharge with prescription for thiamine, multivitamin.   At this time, I do not feel there is any life-threatening condition present. I have reviewed, interpreted and discussed all results (EKG, imaging, lab, urine as appropriate) and exam findings with patient/family. I have reviewed nursing notes and appropriate  previous records.  I feel the patient is safe to be discharged home without further emergent workup and can continue workup as an outpatient as needed. Discussed usual and customary return precautions. Patient/family verbalize understanding and are comfortable with this plan.  Outpatient follow-up has been provided as needed. All questions have been answered.  ____________________________________________   FINAL CLINICAL IMPRESSION(S) / ED DIAGNOSES  Final diagnoses:  SVT (supraventricular tachycardia) (HCC)  Alcohol abuse     ED Discharge Orders          Ordered    B Complex-C-Folic Acid (B COMPLEX-VITAMIN C-FOLIC ACID) 1 MG tablet  Daily with breakfast        04/25/21 0715    thiamine 100 MG tablet  Daily        04/25/21 0715            *Please note:  Jason Livingston was evaluated in Emergency Department on 04/25/2021 for the symptoms described in the history of present illness. He was evaluated in the context of the global COVID-19 pandemic, which necessitated consideration that the patient might be at risk for infection with the SARS-CoV-2 virus that causes COVID-19. Institutional protocols and algorithms that pertain to the evaluation of patients at risk for COVID-19 are in a state of rapid change based on information released by regulatory bodies including the CDC and federal and state organizations. These policies and algorithms were followed during the patient's care in the ED.  Some ED evaluations and interventions may be delayed as a result of limited staffing during and the pandemic.*   Note:  This document was prepared using Dragon voice recognition software and may include unintentional dictation errors.    Marcena Dias, Delice Bison, DO 04/25/21 [phone]

## 2021-04-25 NOTE — ED Provider Notes (Signed)
-----------------------------------------   7:51 AM on 04/25/2021 ----------------------------------------- Slight troponin elevation however this is likely due to his SVT/significant tachycardia.  Does not appear concerning for ACS.  We will discharge with cardiology follow-up.  Patient agreeable to plan of care.   Harvest Dark, MD 04/25/21 [phone]

## 2021-04-25 NOTE — ED Notes (Signed)
Lab called. Troponin up to 33 from 14. Dr Kerman Passey informed.   Pt sitting in bed, resting, denies chest discomfort at this time. Pt is NSR on monitor with HR of 84.

## 2021-04-26 ENCOUNTER — Other Ambulatory Visit: Payer: Self-pay

## 2021-04-27 ENCOUNTER — Other Ambulatory Visit: Payer: Self-pay

## 2021-04-27 NOTE — [facility] Nurse Program (Signed)
  Dept: [facility] Nurse Program Note  Date of Encounter: 04/27/2021 Client to clinic for follow up of ED visit on 9/11 for SVT. New medications on AVS include thiamine and multivitamin with Folate. Medications obtained from Medication management and given to client at visit today. Per instructions on hospital AVS RN assisted client with making a cardiology appointment. Appointment made for 9/14 at 2 pm with Dr. Saunders Revel. Transportation set up through [employer]. VS: 126/92, 84 , oxygen saturation 97% on room air. Support given.    Past Medical History: Past Medical History:  Diagnosis Date   Asthma    GERD (gastroesophageal reflux disease)    Hypertension    Pre-diabetes    Sleep apnea     Encounter Details:  CNP Questionnaire - 04/27/21 1100       Questionnaire   Do you give verbal consent to treat you today? Yes    Visit Setting Church or Organization    Location Patient Served At [facility]    Patient Status Not Applicable    Medical Provider Yes   [facility]   Insurance Uninsured (Includes [employer]/[facility] [place])    Intervention Advocate;Assess (including screenings);Support    Housing/Utilities No permanent housing   lives in a boarding house   Transportation Need transportation assistance

## 2021-04-28 ENCOUNTER — Ambulatory Visit (INDEPENDENT_AMBULATORY_CARE_PROVIDER_SITE_OTHER): Payer: Self-pay | Admitting: Internal Medicine

## 2021-04-28 ENCOUNTER — Other Ambulatory Visit: Payer: Self-pay

## 2021-04-28 ENCOUNTER — Encounter: Payer: Self-pay | Admitting: Internal Medicine

## 2021-04-28 ENCOUNTER — Ambulatory Visit: Payer: Medicaid Other | Admitting: Licensed Clinical Social Worker

## 2021-04-28 VITALS — BP 134/80 | HR 74 | Ht 73.0 in | Wt 202.0 lb

## 2021-04-28 DIAGNOSIS — I471 Supraventricular tachycardia: Secondary | ICD-10-CM

## 2021-04-28 DIAGNOSIS — R0602 Shortness of breath: Secondary | ICD-10-CM

## 2021-04-28 DIAGNOSIS — R079 Chest pain, unspecified: Secondary | ICD-10-CM

## 2021-04-28 DIAGNOSIS — Z720 Tobacco use: Secondary | ICD-10-CM

## 2021-04-28 MED ORDER — ASPIRIN EC 81 MG PO TBEC: 81.0000 mg | DELAYED_RELEASE_TABLET | Freq: Every day | ORAL | Status: DC

## 2021-04-28 MED ORDER — METOPROLOL SUCCINATE ER 25 MG PO TB24
25.0000 mg | ORAL_TABLET | Freq: Every day | ORAL | 3 refills | Status: DC
  Filled 2021-04-28 (×2): qty 30, 30d supply, fill #0

## 2021-04-28 NOTE — Patient Instructions (Signed)
Medication Instructions:   Your physician has recommended you make the following change in your medication:   START Metoprolol Succinate 25 mg daily - An Rx has been sent to your pharmacy  START Aspirin 81 mg daily - you may get this over-the-counter  *If you need a refill on your cardiac medications before your next appointment, please call your pharmacy*   Lab Work:  None ordered  Testing/Procedures:  Your physician has requested that you have an echocardiogram. Echocardiography is a painless test that uses sound waves to create images of your heart. It provides your doctor with information about the size and shape of your heart and how well your heart's chambers and valves are working. This procedure takes approximately one hour. There are no restrictions for this procedure.   Follow-Up: At [facility], you and your health needs are our priority.  As part of our continuing mission to provide you with exceptional heart care, we have created designated Provider Care Teams.  These Care Teams include your primary Cardiologist (physician) and Advanced Practice Providers (APPs -  Physician Assistants and Nurse Practitioners) who all work together to provide you with the care you need, when you need it.  We recommend signing up for the patient portal called "MyChart".  Sign up information is provided on this After Visit Summary.  MyChart is used to connect with patients for Virtual Visits (Telemedicine).  Patients are able to view lab/test results, encounter notes, upcoming appointments, etc.  Non-urgent messages can be sent to your provider as well.   To learn more about what you can do with MyChart, go to [email or web address].    Your next appointment:   1 month(s)  The format for your next appointment:   In Person  Provider:   You may see Dr. Harrell Gave End or one of the following Advanced Practice Providers on your designated Care Team:   Murray Hodgkins, NP Christell Faith,  PA-C Marrianne Mood, PA-C Cadence Kathlen Mody, [place]   Other Instructions  Minimize caffeine and alcohol intake.

## 2021-04-28 NOTE — Progress Notes (Signed)
New Outpatient Visit Date: 04/28/2021  Primary Care provider: Langston Reusing, NP [address] [place],  [place] [address]  Chief Complaint: SVT  HPI:  Jason Livingston is a 55 y.o. male who is being seen today for the evaluation of supraventricular tachycardia. He has a history of hypertension, prediabetes, and alcohol abuse.  He presented to the [facility] emergency department via EMS 3 days ago after developing palpitations, chest pain, and shortness of breath that woke him up from sleep around 1 AM.  When EMS arrived, he was found to be in supraventricular tachycardia.  He converted to sinus tachycardia with 6 mg of adenosine.  He was noted to have mild troponin elevation in the ED (14->33).  Labs were otherwise unrevealing.  Today, Mr. Druin reports palpitations every month or so, which usually only last ~15 minutes.  This weeks episode lasted ~3 hours and was associated with chest pain and shortness of breath, prompting him to call EMS.  He denies chest pain and shortness of breath when active.  He also denies lightheadedness and edema.  He denies prior heart testing.  Since the ED visit, he has felt more fatigued than usual.  --------------------------------------------------------------------------------------------------  Cardiovascular History & Procedures: Cardiovascular Problems: Supraventricular tachycardia  Risk Factors: Hypertension, prediabetes, male gender, and tobacco use  Cath/PCI: None  CV Surgery: None  EP Procedures and Devices: None  Non-Invasive Evaluation(s): None  Recent CV Pertinent Labs: Lab Results  Component Value Date   CHOL 230 (H) 03/31/2021   HDL 47 03/31/2021   LDLCALC 137 (H) 03/31/2021   TRIG 257 (H) 03/31/2021   CHOLHDL 4.9 03/31/2021   K 3.7 04/25/2021   MG 2.0 04/25/2021   BUN 13 04/25/2021   BUN 14 11/25/2020   CREATININE 0.59 (L) 04/25/2021     --------------------------------------------------------------------------------------------------  Past Medical History:  Diagnosis Date   Asthma    GERD (gastroesophageal reflux disease)    Hypertension    Pre-diabetes    Sleep apnea     Past Surgical History:  Procedure Laterality Date   COLONOSCOPY N/A 03/09/2021   Procedure: COLONOSCOPY;  Surgeon: Jonathon Bellows, MD;  Location: [facility] ENDOSCOPY;  Service: Gastroenterology;  Laterality: N/A;   MANDIBLE FRACTURE SURGERY      Current Meds  Medication Sig   albuterol (VENTOLIN HFA) 108 (90 Base) MCG/ACT inhaler Inhale 2 puffs into the lungs every 6 (six) hours as needed for wheezing or shortness of breath.   aspirin EC 81 MG tablet Take 1 tablet (81 mg total) by mouth daily. Swallow whole.   B Complex-C-Folic Acid (B COMPLEX-VITAMIN C-FOLIC ACID) 1 MG tablet Take 1 tablet by mouth once daily with breakfast.   diclofenac Sodium (VOLTAREN) 1 % GEL Apply 2 g topically 4 (four) times daily.   fluticasone furoate-vilanterol (BREO ELLIPTA) 200-25 MCG/INH AEPB Inhale 1 puff into the lungs once daily at 2PM.   losartan (COZAAR) 25 MG tablet Take 1 tablet (25 mg total) by mouth once daily.   MELATONIN PO Take by mouth. Take 2 gummies at HS PRN   metFORMIN (GLUCOPHAGE-XR) 500 MG 24 hr tablet Take 1 tablet (500 mg total) by mouth daily with breakfast.   metoprolol succinate (TOPROL XL) 25 MG 24 hr tablet Take 1 tablet (25 mg total) by mouth once daily.   mirtazapine (REMERON) 15 MG tablet Take 0.5 tablets (7.5 mg total) by mouth once daily at bedtime.   omeprazole (PRILOSEC) 20 MG capsule Take 1 capsule (20 mg total) by mouth once  daily.   rosuvastatin (CRESTOR) 10 MG tablet Take 1 tablet (10 mg total) by mouth once daily.   thiamine (VITAMIN B-1) 100 MG tablet Take 1 tablet (100 mg total) by mouth once daily.    Allergies: Patient has no known allergies.  Social History   Tobacco Use   Smoking status: Every Day    Packs/day: 1.00     Years: 30.00    Pack years: 30.00    Types: Cigarettes   Smokeless tobacco: Former    Quit date: 2012  Vaping Use   Vaping Use: Never used  Substance Use Topics   Alcohol use: Not Currently    Alcohol/week: 14.0 standard drinks    Types: 14 Cans of beer per week    Comment: 2 40oz beers at night to sleep, 02/18/21 20oz with dinner everynight   Drug use: Not Currently    Types: Cocaine, Marijuana, LSD    Comment: used in his 20's, has not used in 16 years    Family History  Problem Relation Age of Onset   Other Mother        unknown medical history   Other Father        unknwon medical history   Hypertension Maternal Grandmother    Heart attack Maternal Grandfather 76   Heart disease Maternal Grandfather    Other Cousin        kidney transplant    Review of Systems: Mr. Kohlbeck notes low back and leg pain for which he recently received a steroid injection.  Otherwise, a 12-system review of systems was performed and was negative except as noted in the HPI.  --------------------------------------------------------------------------------------------------  Physical Exam: BP 134/80 (BP Location: Right Arm, Patient Position: Sitting, Cuff Size: Normal)   Pulse 74   Ht '6\' 1"'$  (1.854 m)   Wt 202 lb (91.6 kg)   SpO2 97%   BMI 26.65 kg/m   General:  NAD HEENT: No conjunctival pallor or scleral icterus. Facemask in place. Neck: Supple without lymphadenopathy, thyromegaly, JVD, or HJR. No carotid bruit. Lungs: Normal work of breathing. Clear to auscultation bilaterally without wheezes or crackles. Heart: Regular rate and rhythm without murmurs, rubs, or gallops. Non-displaced PMI. Abd: Bowel sounds present. Soft, NT/ND without hepatosplenomegaly Ext: No lower extremity edema. Radial, PT, and DP pulses are 2+ bilaterally Skin: Warm and dry without rash. Neuro: CNIII-XII intact. Strength and fine-touch sensation intact in upper and lower extremities bilaterally. Psych: Normal  mood and affect.  EKG:  Normal sinus rhythm without abnormality.  Lab Results  Component Value Date   WBC 9.4 04/25/2021   HGB 15.0 04/25/2021   HCT 42.9 04/25/2021   MCV 100.7 (H) 04/25/2021   PLT 306 04/25/2021    Lab Results  Component Value Date   NA 142 04/25/2021   K 3.7 04/25/2021   CL 108 04/25/2021   CO2 21 (L) 04/25/2021   BUN 13 04/25/2021   CREATININE 0.59 (L) 04/25/2021   GLUCOSE 132 (H) 04/25/2021   ALT 31 04/25/2021    Lab Results  Component Value Date   CHOL 230 (H) 03/31/2021   HDL 47 03/31/2021   LDLCALC 137 (H) 03/31/2021   TRIG 257 (H) 03/31/2021   CHOLHDL 4.9 03/31/2021     --------------------------------------------------------------------------------------------------  ASSESSMENT AND PLAN: SVT: Patient reports monthly episodes of tachycardia accompanied by chest pain and shortness of breath, most likely PSVT based on EMS EKG earlier this week.  We have agreed to obtain an echocardiogram and start metoprolol succinate  25 mg daily.  Vagal maneuvers were discussed as well.  If he continue to have symptoms despite addition of metoprolol, referral to EP will need to be considered.  Chest pain and shortness of breath: Symptoms only occur in the setting of tachycardia.  EKG during SVT showed widespread ST depressions with mild troponin elevation noted in the ED.  We will obtain an echo.  If LVEF is normal, coronary CTA versus MPI will need to be considered.  If LVEF is decreased or wall motion abnormality evident, cath will need to be pursued instead.  In the meantime, we have agreed to start ASA 81 mg daily.  Tobacco use: Smoking cessation encouraged.  Mr. Huizar plans to stop.  Follow-up: Return to clinic in 1 month.  Nelva Bush, MD 04/29/2021 2:43 PM

## 2021-04-29 ENCOUNTER — Encounter: Payer: Self-pay | Admitting: Internal Medicine

## 2021-04-29 ENCOUNTER — Other Ambulatory Visit: Payer: Self-pay

## 2021-04-29 DIAGNOSIS — R0602 Shortness of breath: Secondary | ICD-10-CM | POA: Insufficient documentation

## 2021-04-29 DIAGNOSIS — Z720 Tobacco use: Secondary | ICD-10-CM | POA: Insufficient documentation

## 2021-04-29 DIAGNOSIS — R079 Chest pain, unspecified: Secondary | ICD-10-CM | POA: Insufficient documentation

## 2021-04-29 DIAGNOSIS — I471 Supraventricular tachycardia: Secondary | ICD-10-CM | POA: Insufficient documentation

## 2021-05-02 NOTE — Progress Notes (Signed)
Jason Livingston - 55 y.o. male MRN [id number]  Date of birth: 21-Dec-1965  Office Visit Note: Visit Date: 04/22/2021 PCP: Langston Reusing, NP Referred by: Langston Reusing, NP  Subjective: Chief Complaint  Patient presents with   Lower Back - Pain   Left Leg - Pain   Right Leg - Pain   HPI:  Jason Livingston is a 55 y.o. male who comes in today at the request of Barnet Pall, FNP for planned Bilateral L5-S1 Lumbar Transforaminal epidural steroid injection with fluoroscopic guidance.  The patient has failed conservative care including home exercise, medications, time and activity modification.  This injection will be diagnostic and hopefully therapeutic.  Please see requesting physician notes for further details and justification.   ROS Otherwise per HPI.  Assessment & Plan: Visit Diagnoses:    ICD-10-CM   1. Lumbar radiculopathy  M54.16 XR C-ARM NO REPORT    Epidural Steroid injection    methylPREDNISolone acetate (DEPO-MEDROL) injection 80 mg      Plan: No additional findings.   Meds & Orders:  Meds ordered this encounter  Medications   methylPREDNISolone acetate (DEPO-MEDROL) injection 80 mg    Orders Placed This Encounter  Procedures   XR C-ARM NO REPORT   Epidural Steroid injection    Follow-up: Return if symptoms worsen or fail to improve.   Procedures: No procedures performed  Lumbosacral Transforaminal Epidural Steroid Injection - Sub-Pedicular Approach with Fluoroscopic Guidance  Patient: Jason Livingston      Date of Birth: 05-30-1966 MRN: [id number] PCP: Langston Reusing, NP      Visit Date: 04/22/2021   Universal Protocol:    Date/Time: 04/22/2021  Consent Given By: the patient  Position: PRONE  Additional Comments: Vital signs were monitored before and after the procedure. Patient was prepped and draped in the usual sterile fashion. The correct patient, procedure, and site was verified.   Injection Procedure Details:   Procedure  diagnoses: Lumbar radiculopathy [M54.16]    Meds Administered:  Meds ordered this encounter  Medications   methylPREDNISolone acetate (DEPO-MEDROL) injection 80 mg    Laterality: Bilateral  Location/Site: L5  Needle:5.0 in., 22 ga.  Short bevel or Quincke spinal needle  Needle Placement: Transforaminal  Findings:    -Comments: Excellent flow of contrast along the nerve, nerve root and into the epidural space.  Procedure Details: After squaring off the end-plates to get a true AP view, the C-arm was positioned so that an oblique view of the foramen as noted above was visualized. The target area is just inferior to the "nose of the scotty dog" or sub pedicular. The soft tissues overlying this structure were infiltrated with 2-3 ml. of 1% Lidocaine without Epinephrine.  The spinal needle was inserted toward the target using a "trajectory" view along the fluoroscope beam.  Under AP and lateral visualization, the needle was advanced so it did not puncture dura and was located close the 6 O'Clock position of the pedical in AP tracterory. Biplanar projections were used to confirm position. Aspiration was confirmed to be negative for CSF and/or blood. A 1-2 ml. volume of Isovue-250 was injected and flow of contrast was noted at each level. Radiographs were obtained for documentation purposes.   After attaining the desired flow of contrast documented above, a 0.5 to 1.0 ml test dose of 0.25% Marcaine was injected into each respective transforaminal space.  The patient was observed for 90 seconds post injection.  After no sensory deficits were reported, and normal lower extremity  motor function was noted,   the above injectate was administered so that equal amounts of the injectate were placed at each foramen (level) into the transforaminal epidural space.   Additional Comments:  No complications occurred Dressing: 2 x 2 sterile gauze and Band-Aid    Post-procedure details: Patient was  observed during the procedure. Post-procedure instructions were reviewed.  Patient left the clinic in stable condition.    Clinical History: MRI LUMBAR SPINE WITHOUT CONTRAST   TECHNIQUE: Multiplanar, multisequence MR imaging of the lumbar spine was performed. No intravenous contrast was administered.   COMPARISON:  Radiographs February 04, 2021.   FINDINGS: Segmentation:  Standard.   Alignment: Straightening of the lumbar lordosis. Minimal retrolisthesis of L3 over L4.   Vertebrae: No fracture, evidence of discitis, or bone lesion. Endplate degenerative changes at L3-4, L4-5 and L5-S1.   Conus medullaris and cauda equina: Conus extends to the L1-2 level. Conus and cauda equina appear normal.   Paraspinal and other soft tissues: Negative.   Disc levels:   T12-L1: No spinal canal or neural foraminal stenosis.   L1-2: No spinal canal or neural foraminal stenosis.   L2-3: Small left central disc protrusion. No significant spinal canal or neural foraminal stenosis.   L3-4: Loss of disc height, disc bulge and mild facet degenerative changes resulting in mild bilateral neural foraminal narrowing. No significant spinal canal stenosis.   L4-5: Disc bulge, mild facet degenerative changes with mild bilateral joint effusion and mild ligamentum flavum redundancy resulting in mild bilateral neural foraminal narrowing. No significant spinal canal stenosis.   L5-S1: Loss of disc height, right asymmetric disc bulge and mild facet degenerative changes resulting in mild narrowing of the right subarticular zone and moderate bilateral neural foraminal narrowing.   IMPRESSION: 1. Degenerative changes of the lumbar spine, more pronounced at L5-S1 where there is narrowing of the right subarticular zone and moderate bilateral neural foraminal narrowing. 2. Mild bilateral neural foraminal narrowing at L3-4 and L4-5. 3. No high-grade spinal canal stenosis.     Electronically Signed    By: Pedro Earls M.D.   On: 03/23/2021 17:15     Objective:  VS:  HT:    WT:   BMI:     BP:138/88  HR:80bpm  TEMP: ( )  RESP:  Physical Exam Vitals and nursing note reviewed.  Constitutional:      General: He is not in acute distress.    Appearance: Normal appearance. He is not ill-appearing.  HENT:     Head: Normocephalic and atraumatic.     Right Ear: External ear normal.     Left Ear: External ear normal.     Nose: No congestion.  Eyes:     Extraocular Movements: Extraocular movements intact.  Cardiovascular:     Rate and Rhythm: Normal rate.     Pulses: Normal pulses.  Pulmonary:     Effort: Pulmonary effort is normal. No respiratory distress.  Abdominal:     General: There is no distension.     Palpations: Abdomen is soft.  Musculoskeletal:        General: No tenderness or signs of injury.     Cervical back: Neck supple.     Right lower leg: No edema.     Left lower leg: No edema.     Comments: Patient has good distal strength without clonus.  Skin:    Findings: No erythema or rash.  Neurological:     General: No focal deficit present.  Mental Status: He is alert and oriented to person, place, and time.     Sensory: No sensory deficit.     Motor: No weakness or abnormal muscle tone.     Coordination: Coordination normal.  Psychiatric:        Mood and Affect: Mood normal.        Behavior: Behavior normal.     Imaging: No results found.

## 2021-05-02 NOTE — Procedures (Signed)
Lumbosacral Transforaminal Epidural Steroid Injection - Sub-Pedicular Approach with Fluoroscopic Guidance  Patient: Jason Livingston      Date of Birth: 12/30/1965 MRN: [id number] PCP: Langston Reusing, NP      Visit Date: 04/22/2021   Universal Protocol:    Date/Time: 04/22/2021  Consent Given By: the patient  Position: PRONE  Additional Comments: Vital signs were monitored before and after the procedure. Patient was prepped and draped in the usual sterile fashion. The correct patient, procedure, and site was verified.   Injection Procedure Details:   Procedure diagnoses: Lumbar radiculopathy [M54.16]    Meds Administered:  Meds ordered this encounter  Medications   methylPREDNISolone acetate (DEPO-MEDROL) injection 80 mg    Laterality: Bilateral  Location/Site: L5  Needle:5.0 in., 22 ga.  Short bevel or Quincke spinal needle  Needle Placement: Transforaminal  Findings:    -Comments: Excellent flow of contrast along the nerve, nerve root and into the epidural space.  Procedure Details: After squaring off the end-plates to get a true AP view, the C-arm was positioned so that an oblique view of the foramen as noted above was visualized. The target area is just inferior to the "nose of the scotty dog" or sub pedicular. The soft tissues overlying this structure were infiltrated with 2-3 ml. of 1% Lidocaine without Epinephrine.  The spinal needle was inserted toward the target using a "trajectory" view along the fluoroscope beam.  Under AP and lateral visualization, the needle was advanced so it did not puncture dura and was located close the 6 O'Clock position of the pedical in AP tracterory. Biplanar projections were used to confirm position. Aspiration was confirmed to be negative for CSF and/or blood. A 1-2 ml. volume of Isovue-250 was injected and flow of contrast was noted at each level. Radiographs were obtained for documentation purposes.   After attaining the  desired flow of contrast documented above, a 0.5 to 1.0 ml test dose of 0.25% Marcaine was injected into each respective transforaminal space.  The patient was observed for 90 seconds post injection.  After no sensory deficits were reported, and normal lower extremity motor function was noted,   the above injectate was administered so that equal amounts of the injectate were placed at each foramen (level) into the transforaminal epidural space.   Additional Comments:  No complications occurred Dressing: 2 x 2 sterile gauze and Band-Aid    Post-procedure details: Patient was observed during the procedure. Post-procedure instructions were reviewed.  Patient left the clinic in stable condition.

## 2021-05-13 ENCOUNTER — Ambulatory Visit: Payer: Self-pay | Admitting: Licensed Clinical Social Worker

## 2021-05-13 ENCOUNTER — Other Ambulatory Visit: Payer: Self-pay

## 2021-05-13 ENCOUNTER — Ambulatory Visit: Payer: Medicaid Other | Admitting: Licensed Clinical Social Worker

## 2021-05-13 DIAGNOSIS — F419 Anxiety disorder, unspecified: Secondary | ICD-10-CM

## 2021-05-13 DIAGNOSIS — Z87898 Personal history of other specified conditions: Secondary | ICD-10-CM

## 2021-05-13 NOTE — BH Specialist Note (Addendum)
Integrated Behavioral Health Follow Up Telephone Visit  MRN: [id number] Name: Adelbert Gaspard  Total time: 60 minutes  Types of Service: Telephone visit Patient consents to telephone visit and 2 patient identifiers were used to identify patient   Interpretor:No. Interpretor Name and Language: N/A  Subjective: Dequavius Kuhner is a 55 y.o. male accompanied by  himself  Patient was referred by Carlyon Shadow, NP for Mental Health. Patient reports the following symptoms/concerns: The patient reported that he has been doing about the same since his last follow up appointment. He noted that a friend bought him an Estate agent and he was studying all the tools in the kit to figure out what each item was for. He explained that he finds art to be therapeutic for him and he is excited to be able to get back to doing things he use to enjoy. The patient noted that he is sleeping somewhat better, but has occasional nights where he finds his mind is overactive preventing him from falling asleep. Cerrone reported that he takes two 5 MG Melatonin gummies and one Mirtazapine 15 MG tablet before bedtime, falls asleep in 15-20 minutes; sleeping a total of six hours per night. He shared that he wakes up occasionally, however, feels "[place] and lightheaded"  for 2-3 hours every morning that interferes with his ability to function. The patient discussed other financial and health stressors present in his life. He noted that was in the hospital due to SVT and discussed the treatment options he has given.The patient denied any suicidal or homicidal thoughts. Duration of problem: Years; Severity of problem: moderate  Objective: Mood: Euthymic and Affect: Appropriate Risk of harm to self or others: No plan to harm self or others  Life Context: Family and Social: see above School/Work: see above Self-Care: see above Life Changes: see above  Patient and/or Family's Strengths/Protective Factors: Concrete supports in  place (healthy food, safe environments, etc.)  Goals Addressed: Patient will:  Reduce symptoms of: agitation, insomnia, and stress   Increase knowledge and/or ability of: coping skills, healthy habits, self-management skills, and stress reduction   Demonstrate ability to: Increase healthy adjustment to current life circumstances  Progress towards Goals: Ongoing  Interventions: Interventions utilized:  CBT Cognitive Behavioral Therapy and Sleep Hygiene was utilized by the clinician during today's follow up session. The clinician processed with the patient how they have been doing since the last follow-up session. The clinician provided a space for the patient to ventilate their frustrations regarding their current life circumstances. Clinician measured the patient's anxiety and depression on a numerical scale.  Clinician assessed the patients pattern of sleep, bedtime routines, activities associated with the bed, activity and energy level while awake, night time snacking, stimulant use, daytime napping, total sleep amounts. Clinician explored the patients thoughts and associated emotions regarding sleep. The clinician encouraged the patient to utilize their coping skills to deal with their current life circumstances.    Standardized Assessments completed: GAD-7 and PHQ 9 PHQ-9 =   8 GAD-7 =  11  Assessment: Patient currently experiencing see above.   Patient may benefit from see above.  Plan: Follow up with behavioral health clinician on : 06/02/2021 at 5:00 PM  Behavioral recommendations:  Referral(s): [place] (In Clinic) "From scale of 1-10, how likely are you to follow plan?":   Lesli Albee, LCSWA

## 2021-05-20 ENCOUNTER — Ambulatory Visit: Payer: Medicaid Other

## 2021-05-20 ENCOUNTER — Other Ambulatory Visit: Payer: Self-pay
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
## 2021-05-20 NOTE — Progress Notes (Unsigned)
  Subjective:     Patient ID: Jason Livingston, male   DOB: August 03, 1966, 55 y.o.   MRN: [id number]  HPI   Review of Systems     Objective:   Physical Exam     Assessment:     ***    Plan:     ***

## 2021-05-27 ENCOUNTER — Telehealth: Payer: Self-pay | Admitting: Licensed Clinical Social Worker

## 2021-05-27 NOTE — Telephone Encounter (Signed)
Juliette referred the patient to [employer] for rent funding, [employer] (physical sciences) for transportation, and [employer] for additional [employer]. -BG
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
## 2021-06-01 ENCOUNTER — Other Ambulatory Visit: Payer: Self-pay

## 2021-06-01 ENCOUNTER — Ambulatory Visit (INDEPENDENT_AMBULATORY_CARE_PROVIDER_SITE_OTHER): Payer: Self-pay

## 2021-06-01 ENCOUNTER — Telehealth: Payer: Self-pay | Admitting: Licensed Clinical Social Worker

## 2021-06-01 DIAGNOSIS — I471 Supraventricular tachycardia: Secondary | ICD-10-CM

## 2021-06-01 LAB — ECHOCARDIOGRAM COMPLETE
AR max vel: 3.72 cm2
AV Area VTI: 3.91 cm2
AV Area mean vel: 3.9 cm2
AV Mean grad: 4 mmHg
AV Peak grad: 8.3 mmHg
Ao pk vel: 1.44 m/s
Area-P 1/2: 3.91 cm2
Calc EF: 56.7 %
S' Lateral: 3 cm
Single Plane A2C EF: 60.7 %
Single Plane A4C EF: 55.7 %

## 2021-06-01 NOTE — Telephone Encounter (Signed)
Follow up with patient on resources we referred him to. Patient had not reached out to any of them. We went over the contact info and other information for them again, as well as the phone number for [employer]. Will follow up again next week.

## 2021-06-02 ENCOUNTER — Ambulatory Visit: Payer: Medicaid Other | Admitting: Licensed Clinical Social Worker

## 2021-06-03 ENCOUNTER — Telehealth: Payer: Self-pay | Admitting: Internal Medicine

## 2021-06-03 NOTE — Telephone Encounter (Signed)
Nelva Bush, MD  06/03/2021  6:40 AM EDT     Echocardiogram shows that [name] heart is contracting well.  There is mild stiffening of the left ventricle, leakage of the mitral valve, and enlargement of the left atrium, likely related to his history of high blood pressure.  These findings can sometimes predispose to abnormal heart rhythms including supraventricular tachycardia.  I recommend that he continue his current medications and follow-up as previously arranged

## 2021-06-03 NOTE — Telephone Encounter (Signed)
Attempted to call the patient. No answer- I left a message to please call back.  

## 2021-06-07 NOTE — Telephone Encounter (Signed)
I spoke with the patient regarding his echo results and Dr. Darnelle Bos recommendations to keep his follow up as scheduled on 06/16/21.  The patient voices understanding and is agreeable.

## 2021-06-09 ENCOUNTER — Other Ambulatory Visit: Payer: Self-pay

## 2021-06-10 ENCOUNTER — Ambulatory Visit: Payer: Medicaid Other | Admitting: Licensed Clinical Social Worker

## 2021-06-10 ENCOUNTER — Other Ambulatory Visit: Payer: Self-pay

## 2021-06-10 ENCOUNTER — Ambulatory Visit: Payer: Self-pay | Admitting: Licensed Clinical Social Worker

## 2021-06-10 DIAGNOSIS — F419 Anxiety disorder, unspecified: Secondary | ICD-10-CM

## 2021-06-10 DIAGNOSIS — F32A Depression, unspecified: Secondary | ICD-10-CM

## 2021-06-10 DIAGNOSIS — Z87898 Personal history of other specified conditions: Secondary | ICD-10-CM

## 2021-06-10 NOTE — BH Specialist Note (Signed)
Integrated Behavioral Health Follow Up Telephone Visit  MRN: [id number] Name: Jason Livingston  Total time: 60 minutes  Types of Service: Telephone visit Patient consents to telephone visit and 2 patient identifiers were used to identify patient  Interpretor:No. Interpretor Name and Language: N/A  Subjective: Jason Livingston is a 55 y.o. male accompanied by  herself Patient was referred by Carlyon Shadow, Np for Mental Health. Patient reports the following symptoms/concerns: The patient noted that in some ways he is doing better since his last follow up visit. He explained that he is sleeping better and is not waking up very often. He noted that even though he is sleeping more he still feels fatigued in the mornings and throughout the day. Jason Livingston shared that he was out of work due to health problems and has not been able to pay his rent. He shared today he was suppose to appear in court regarding an eviction notice. He explained that he sopoke to his landlord who agreed to hold off on eviction for 14 days. Jason Livingston stated that he called the [employer] but was told to call back in a couple of weeks. He explained that [employer] [facility] helped get him into his  current room and has helped him previously so he was unsure if he qualified for further assistance. The patient explained he is feeling more anxious and is very concerned about becoming homeless again. He noted that he is certain his body can not handle the stress of living on the streets. 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 Jason Livingston denied any suicidal or homicidal thoughts.  Duration of problem: Years; Severity of problem: moderate  Objective: Mood: Euthymic and Affect: Appropriate Risk of harm to self or others: No plan to harm self or others   Life Context: Family and Social: see above School/Work: see above Self-Care: see above Life Changes: see above  Patient and/or Family's  Strengths/Protective Factors: Concrete supports in place (healthy food, safe environments, etc.)  Goals Addressed: Patient will:  Reduce symptoms of: agitation, anxiety, depression, insomnia, and stress   Increase knowledge and/or ability of: coping skills, healthy habits, self-management skills, and stress reduction   Demonstrate ability to: Increase healthy adjustment to current life circumstances, Increase motivation to adhere to plan of care, and Decrease self-medicating behaviors  Progress towards Goals: Ongoing  Interventions: Interventions utilized:  CBT Cognitive Behavioral Therapy and Link to [employer] was utilized by the clinician during today's follow up session. Clinician met with patient to identify needs related to stressors and functioning, and assess and monitor for signs and symptoms of anxiety and depression, and assess safety. The clinician processed with the patient how they have been doing since the last follow-up session. Clinician measured the patients anxiety and depression on a numerical scale. Clinician offered the patient several resources to call to inquire about rental funds. Clinician encouraged the patient to reach out to [facility] to check if he qualified for additional assistance.  Standardized Assessments completed: GAD-7 and PHQ 9 GAD-7  = 12 PHQ-9  =   8  Assessment: Patient currently experiencing see above.   Patient may benefit from see above.  Plan: Follow up with behavioral health clinician on : 06/15/2021 at 4:30 PM  Behavioral recommendations:  Referral(s): [place] (In Clinic) "From scale of 1-10, how likely are you to follow plan?":   Lesli Albee, LCSWA

## 2021-06-15 ENCOUNTER — Telehealth: Payer: Self-pay | Admitting: Licensed Clinical Social Worker

## 2021-06-15 ENCOUNTER — Ambulatory Visit: Payer: Medicaid Other | Admitting: Licensed Clinical Social Worker

## 2021-06-15 NOTE — Telephone Encounter (Signed)
Called 11/1 and provided [facility] healthcare phone number. Also told him I would look into organizations for rental/utility assistance that were included on the [employer].

## 2021-06-16 ENCOUNTER — Ambulatory Visit (INDEPENDENT_AMBULATORY_CARE_PROVIDER_SITE_OTHER): Payer: Self-pay | Admitting: Internal Medicine

## 2021-06-16 ENCOUNTER — Other Ambulatory Visit: Payer: Self-pay

## 2021-06-16 ENCOUNTER — Encounter: Payer: Self-pay | Admitting: Internal Medicine

## 2021-06-16 VITALS — BP 122/90 | HR 97 | Ht 73.0 in | Wt 201.0 lb

## 2021-06-16 DIAGNOSIS — E1169 Type 2 diabetes mellitus with other specified complication: Secondary | ICD-10-CM

## 2021-06-16 DIAGNOSIS — R0602 Shortness of breath: Secondary | ICD-10-CM

## 2021-06-16 DIAGNOSIS — Z720 Tobacco use: Secondary | ICD-10-CM

## 2021-06-16 DIAGNOSIS — I739 Peripheral vascular disease, unspecified: Secondary | ICD-10-CM

## 2021-06-16 DIAGNOSIS — I10 Essential (primary) hypertension: Secondary | ICD-10-CM

## 2021-06-16 DIAGNOSIS — R079 Chest pain, unspecified: Secondary | ICD-10-CM

## 2021-06-16 DIAGNOSIS — E785 Hyperlipidemia, unspecified: Secondary | ICD-10-CM

## 2021-06-16 DIAGNOSIS — I471 Supraventricular tachycardia: Secondary | ICD-10-CM

## 2021-06-16 MED ORDER — METOPROLOL SUCCINATE ER 50 MG PO TB24
50.0000 mg | ORAL_TABLET | Freq: Every day | ORAL | 2 refills | Status: DC
  Filled 2021-06-16: qty 30, 30d supply, fill #0
  Filled 2021-09-06: qty 30, 30d supply, fill #1

## 2021-06-16 MED ORDER — ROSUVASTATIN CALCIUM 10 MG PO TABS
10.0000 mg | ORAL_TABLET | Freq: Every day | ORAL | 2 refills | Status: DC
  Filled 2021-06-16: qty 30, 30d supply, fill #0
  Filled 2021-09-06: qty 30, 30d supply, fill #1

## 2021-06-16 NOTE — Progress Notes (Addendum)
Follow-up Outpatient Visit Date: 06/16/2021  Primary Care Provider: Langston Reusing, NP [place] [address]  Chief Complaint: Follow-up SVT  HPI:  Mr. Gerke is a 55 y.o. male with history of SVT, hypertension, prediabetes, and alcohol abuse, who presents for follow-up of SVT.  I saw him in mid September after ED visit for palpitations due to SVT, successfully treated with IV adenosine.  Minimal troponin elevation was noted at the ED visit.  We agreed to add metoprolol succinate 25 mg daily and obtain an echocardiogram.  This showed normal LVEF with grade 1 diastolic dysfunction, mild mitral regurgitation, and mild left atrial enlargement.  Today, Mr. Kader reports that he has noticed some improvement in the frequency and severity of his palpitations with addition of metoprolol.  He still notes episodes few times a week, typically lasting a few minutes with associated shortness of breath, lightheadedness, and mild chest tightness.  He does not have any exertional chest pain or dyspnea, though he notes his activity has been limited due to bilateral thigh and calf pain/tightness whenever he walks.  He was recently told that he has some spinal stenosis and arthritis in his low back.  He denies wounds or rest pain in his legs, though he notes some occasional numbness in his feet.  He is tolerating metoprolol without side effects.  He does not have any edema.  --------------------------------------------------------------------------------------------------  Cardiovascular History & Procedures: Cardiovascular Problems: Supraventricular tachycardia   Risk Factors: Hypertension, prediabetes, male gender, and tobacco use   Cath/PCI: None   CV Surgery: None   EP Procedures and Devices: None   Non-Invasive Evaluation(s): TTE (06/01/2021): Normal LV size and wall thickness.  LVEF 60-65% with grade 1 diastolic dysfunction.  Normal RV size and function.  Normal  PA pressure.  Mild left atrial enlargement.  Mild mitral regurgitation.  Normal CVP.  Upper normal aortic size.  Recent CV Pertinent Labs: Lab Results  Component Value Date   CHOL 230 (H) 03/31/2021   HDL 47 03/31/2021   LDLCALC 137 (H) 03/31/2021   TRIG 257 (H) 03/31/2021   CHOLHDL 4.9 03/31/2021   K 3.7 04/25/2021   MG 2.0 04/25/2021   BUN 13 04/25/2021   BUN 14 11/25/2020   CREATININE 0.59 (L) 04/25/2021    Past medical and surgical history were reviewed and updated in EPIC.  Current Meds  Medication Sig   albuterol (VENTOLIN HFA) 108 (90 Base) MCG/ACT inhaler Inhale 2 puffs into the lungs every 6 (six) hours as needed for wheezing or shortness of breath.   aspirin EC 81 MG tablet Take 1 tablet (81 mg total) by mouth daily. Swallow whole.   B Complex-C-Folic Acid (B COMPLEX-VITAMIN C-FOLIC ACID) 1 MG tablet Take 1 tablet by mouth once daily with breakfast.   diclofenac Sodium (VOLTAREN) 1 % GEL Apply 2 g topically 4 (four) times daily.   fluticasone furoate-vilanterol (BREO ELLIPTA) 200-25 MCG/INH AEPB Inhale 1 puff into the lungs once daily at 2PM.   losartan (COZAAR) 25 MG tablet Take 1 tablet (25 mg total) by mouth once daily.   MELATONIN PO Take by mouth. Take 2 gummies at HS PRN   metFORMIN (GLUCOPHAGE-XR) 500 MG 24 hr tablet Take 1 tablet (500 mg total) by mouth daily with breakfast.   metoprolol succinate (TOPROL XL) 25 MG 24 hr tablet Take 1 tablet (25 mg total) by mouth once daily.   mirtazapine (REMERON) 15 MG tablet Take 0.5 tablets (7.5 mg total) by mouth  once daily at bedtime.   omeprazole (PRILOSEC) 20 MG capsule Take 1 capsule (20 mg total) by mouth once daily.   rosuvastatin (CRESTOR) 10 MG tablet Take 1 tablet (10 mg total) by mouth once daily.   thiamine (VITAMIN B-1) 100 MG tablet Take 1 tablet (100 mg total) by mouth once daily.    Allergies: Patient has no known allergies.  Social History   Tobacco Use   Smoking status: Every Day    Packs/day: 1.00     Years: 30.00    Pack years: 30.00    Types: Cigarettes   Smokeless tobacco: Former    Quit date: 2012  Vaping Use   Vaping Use: Never used  Substance Use Topics   Alcohol use: Not Currently    Alcohol/week: 14.0 standard drinks    Types: 14 Cans of beer per week    Comment: 06/16/21 "1 beer per day"; hx of 2 40oz beers at night to sleep, 02/18/21 20oz with dinner everynight   Drug use: Not Currently    Types: Cocaine, Marijuana, LSD    Comment: used in his 20's, has not used in 22 years    Family History  Problem Relation Age of Onset   Other Mother        unknown medical history   Other Father        unknwon medical history   Hypertension Maternal Grandmother    Heart attack Maternal Grandfather 76   Heart disease Maternal Grandfather    Other Cousin        kidney transplant    Review of Systems: A 12-system review of systems was performed and was negative except as noted in the HPI.  --------------------------------------------------------------------------------------------------  Physical Exam: BP 122/90 (BP Location: Left Arm, Patient Position: Sitting, Cuff Size: Large)   Pulse 97   Ht 6\' 1"  (1.854 m)   Wt 201 lb (91.2 kg)   SpO2 97%   BMI 26.52 kg/m   General:  NAD. Neck: No JVD or HJR. Lungs: Clear to auscultation bilaterally without wheezes or crackles. Heart: Regular rate and rhythm without murmurs, rubs, or gallops. Abdomen: Soft, nontender, nondistended. Extremities: No lower extremity edema.  1+ pedal pulses bilaterally.  EKG: Normal sinus rhythm without abnormality.  No significant change from prior tracing on 04/28/2021.  Lab Results  Component Value Date   WBC 9.4 04/25/2021   HGB 15.0 04/25/2021   HCT 42.9 04/25/2021   MCV 100.7 (H) 04/25/2021   PLT 306 04/25/2021    Lab Results  Component Value Date   NA 142 04/25/2021   K 3.7 04/25/2021   CL 108 04/25/2021   CO2 21 (L) 04/25/2021   BUN 13 04/25/2021   CREATININE 0.59 (L) 04/25/2021    GLUCOSE 132 (H) 04/25/2021   ALT 31 04/25/2021    Lab Results  Component Value Date   CHOL 230 (H) 03/31/2021   HDL 47 03/31/2021   LDLCALC 137 (H) 03/31/2021   TRIG 257 (H) 03/31/2021   CHOLHDL 4.9 03/31/2021    --------------------------------------------------------------------------------------------------  ASSESSMENT AND PLAN: Supraventricular tachycardia: Palpitations have improved with less frequent and severe episodes though he still gets them a few times a week.  He is tolerating metoprolol succinate well, which we have agreed to increase to 50 mg daily.  If he still is having some symptoms at follow-up, we will refer him to electrophysiology for further evaluation.  Chest pain and shortness of breath: Episodes are primarily related to SVT.  When patient presented to  the emergency department in September in the setting of SVT, he was noted to have mild troponin elevation.  Given his multiple cardiac risk factors and continued associated chest discomfort, we have agreed to obtain a pharmacologic myocardial perfusion stress test to exclude underlying ischemic heart disease.  Of note, recent echocardiogram did not show any significant structural abnormalities.  Claudication: Mr. Carreira reports bilateral thigh and calf pain with walking.  While this could be related to his spinal stenosis, PAD is also a concern, particularly given his history of hypertension, hyperlipidemia, diabetes mellitus, and tobacco use.  We will obtain ABIs as well as aortoiliac and bilateral lower extremity arterial Dopplers.  There is no evidence of critical limb ischemia today, though pedal pulses are mildly diminished on exam.  Hyperlipidemia associated with type 2 diabetes mellitus: Continue rosuvastatin 10 mg daily.  May need to consider dose escalation at follow-up if lipids remain above goal, particularly if there is clinical evidence of ASCVD on pending work-up.  Hypertension: Blood pressure  mildly elevated with DBP of 90.  We will increase metoprolol succinate, as outlined above and continue current dose of losartan.  Tobacco abuse: Smoking cessation strongly encouraged.  Shared Decision Making/Informed Consent The risks [chest pain, shortness of breath, cardiac arrhythmias, dizziness, blood pressure fluctuations, myocardial infarction, stroke/transient ischemic attack, nausea, vomiting, allergic reaction, radiation exposure, metallic taste sensation and life-threatening complications (estimated to be 1 in 10,000)], benefits (risk stratification, diagnosing coronary artery disease, treatment guidance) and alternatives of a nuclear stress test were discussed in detail with Mr. Bedoya and he agrees to proceed.  Follow-up: Return to clinic in 6 weeks.  Nelva Bush, MD 06/16/2021 3:17 PM

## 2021-06-16 NOTE — Patient Instructions (Signed)
Medication Instructions:   Your physician has recommended you make the following change in your medication:   INCREASE Metoprolol Succinate 50 mg daily   *If you need a refill on your cardiac medications before your next appointment, please call your pharmacy*   Lab Work:  None ordered  Testing/Procedures:  1) Your physician has requested that you have an aorta/IVC/iliac duplex. During this test, an ultrasound is used to evaluate the aorta. Allow 30 minutes for this exam. Do not eat after midnight the day before and avoid carbonated beverages. You may take your regular medications with water only.   2) Your physician has requested that you have a lower extremity arterial exercise duplex. During this test, exercise and ultrasound are used to evaluate arterial blood flow in the legs. Allow one hour for this exam. There are no restrictions or special instructions.  3) Your physician has requested that you have an ankle brachial index (ABI). During this test an ultrasound and blood pressure cuff are used to evaluate the arteries that supply the arms and legs with blood. Allow thirty minutes for this exam. There are no restrictions or special instructions.  4) [place]  Your caregiver has ordered a Stress Test with nuclear imaging. The purpose of this test is to evaluate the blood supply to your heart muscle. This procedure is referred to as a "Non-Invasive Stress Test." This is because other than having an IV started in your vein, nothing is inserted or "invades" your body. Cardiac stress tests are done to find areas of poor blood flow to the heart by determining the extent of coronary artery disease (CAD). Some patients exercise on a treadmill, which naturally increases the blood flow to your heart, while others who are  unable to walk on a treadmill due to physical limitations have a pharmacologic/chemical stress agent called Lexiscan . This medicine will mimic walking on a treadmill by  temporarily increasing your coronary blood flow.   Please note: these test may take anywhere between 2-4 hours to complete  PLEASE REPORT TO [place] AT THE FIRST DESK WILL DIRECT YOU WHERE TO GO  Date of Procedure:_____________________________________  Arrival Time for Procedure:______________________________  Instructions regarding medication:   __X__ : Hold diabetes medication morning of procedure - Metformin  __X__:  Hold betablocker(s) night before procedure and morning of procedure - Metoprolol   PLEASE NOTIFY THE OFFICE AT LEAST 24 HOURS IN ADVANCE IF YOU ARE UNABLE TO KEEP YOUR APPOINTMENT.  [phone] AND  PLEASE NOTIFY NUCLEAR MEDICINE AT [facility] AT LEAST 24 HOURS IN ADVANCE IF YOU ARE UNABLE TO KEEP YOUR APPOINTMENT. [phone]  How to prepare for your Myoview test:  Do not eat or drink after midnight No caffeine for 24 hours prior to test No smoking 24 hours prior to test. Your medication may be taken with water.  If your doctor stopped a medication because of this test, do not take that medication. Please wear a short sleeve shirt. No perfume, cologne or lotion.    Follow-Up: At [facility], you and your health needs are our priority.  As part of our continuing mission to provide you with exceptional heart care, we have created designated Provider Care Teams.  These Care Teams include your primary Cardiologist (physician) and Advanced Practice Providers (APPs -  Physician Assistants and Nurse Practitioners) who all work together to provide you with the care you need, when you need it.  We recommend signing up for the patient portal  called "MyChart".  Sign up information is provided on this After Visit Summary.  MyChart is used to connect with patients for Virtual Visits (Telemedicine).  Patients are able to view lab/test results, encounter notes, upcoming appointments, etc.  Non-urgent messages can be sent to your provider as  well.   To learn more about what you can do with MyChart, go to [email or web address].    Your next appointment:   6 week(s)  The format for your next appointment:   In Person  Provider:   You may see Dr. Harrell Gave End or one of the following Advanced Practice Providers on your designated Care Team:   Murray Hodgkins, NP Christell Faith, PA-C Marrianne Mood, PA-C Cadence Stryker, [place]

## 2021-06-17 ENCOUNTER — Encounter: Payer: Self-pay | Admitting: Internal Medicine

## 2021-06-17 ENCOUNTER — Ambulatory Visit: Payer: Medicaid Other | Admitting: Licensed Clinical Social Worker

## 2021-06-17 ENCOUNTER — Telehealth: Payer: Self-pay | Admitting: Licensed Clinical Social Worker

## 2021-06-17 ENCOUNTER — Other Ambulatory Visit: Payer: Self-pay

## 2021-06-17 ENCOUNTER — Ambulatory Visit: Payer: Self-pay | Admitting: Licensed Clinical Social Worker

## 2021-06-17 DIAGNOSIS — I739 Peripheral vascular disease, unspecified: Secondary | ICD-10-CM | POA: Insufficient documentation

## 2021-06-17 DIAGNOSIS — Z87898 Personal history of other specified conditions: Secondary | ICD-10-CM

## 2021-06-17 DIAGNOSIS — F419 Anxiety disorder, unspecified: Secondary | ICD-10-CM

## 2021-06-17 DIAGNOSIS — F32A Depression, unspecified: Secondary | ICD-10-CM

## 2021-06-17 NOTE — Telephone Encounter (Signed)
Told AS requirements for Social Security Card replacement application. Also told him we would mail Medical Records to [facility]. Gave him phone numbers for Robin at [employer] and [place] at the [facility].

## 2021-06-17 NOTE — Telephone Encounter (Signed)
This encounter opened in error.

## 2021-06-17 NOTE — BH Specialist Note (Signed)
Integrated Behavioral Health Follow Up Telephone Visit  MRN: [id number] Name: Jason Livingston   Total time: 60 minutes  Types of Service: Telephone visit Patient consents to telephone visit and 2 patient identifiers were used to identify patient   Interpretor:No. Interpretor Name and Language: N/A  Subjective: Jason Livingston is a 55 y.o. male accompanied by  herself Patient was referred by Jason Shadow, NP for mental health. Patient reports the following symptoms/concerns: The patient reports that he has been doing okay since his last follow up appointment. He noted that the social work interns at the [employer] called him and provided him with several numbers to call to check for assistance paying his past due rents. He noted that he did not go to [employer] [facility] because he had received help from them previously and felt he would not qualify. Jason Livingston explained that the increased stress is impacting his mood. 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 He noted that the shelter in [place] would not be an option as Clinical biochemist picks and chooses who can stay. The patient did not know what the criteria was for staying at the shelter. He noted that he use to stay in an abandoned building on [address] in [place]. He stated, "my body and health are not good and I can't do that again." However, the patient noted that he may have no other option. Jason Livingston shared he would remain in contact and attend his appointments at the [facility] even if he becomes homeless. He denied any suicidal or homicidal thoughts.  Duration of problem: Years; Severity of problem: moderate  Objective: Mood: Euthymic and Affect: Appropriate Risk of harm to self or others: No plan to harm self or others  Life Context: Family and Social: see above School/Work: see  above Self-Care: see above Life Changes: see above  Patient and/or Family's Strengths/Protective Factors: Concrete supports in place (healthy food, safe environments, etc.)  Goals Addressed: Patient will:  Reduce symptoms of: agitation, anxiety, depression, mood instability, and stress   Increase knowledge and/or ability of: coping skills, healthy habits, self-management skills, and stress reduction   Demonstrate ability to: Increase healthy adjustment to current life circumstances and Increase adequate support systems for patient/family  Progress towards Goals: Ongoing  Interventions: Interventions utilized:  CBT Cognitive Behavioral Therapywas utilized by the clinician during today's follow up session. Clinician met with patient to identify needs related to stressors and functioning, and assess and monitor for signs and symptoms of anxiety and depression, and assess safety. The clinician processed with the patient how they have been doing since the last follow-up session. Clinician provided a safe judgment free space for the client to ventilate his frustrations and emotions regarding his housing and financial crisis. Clinician intervened with positive regard and optimism to validate client's emotions, and supported client in exploring ways to self advocate and seek resources to help him deal with his current life circumstances. Clinician provided the patient with additional contact information for community supports.  Standardized Assessments completed: GAD-7 and PHQ 9 GAD-7 =  15 PHQ-9 =  12   Assessment: Patient currently experiencing see above.   Patient may benefit from see above.  Plan: Follow up with behavioral health clinician on : 07/06/2021 at 2:00 PM Behavioral recommendations:  Referral(s): [place] (In  Clinic) "From scale of 1-10, how likely are you to follow plan?":   Jason Livingston, LCSWA

## 2021-06-17 NOTE — Telephone Encounter (Signed)
Checked in with Jason Livingston about progress finding rental funds. Suggested he call [employer] [facility] and get on their waiting list. Also provided information regarding applying for replacement ID and Social security cards.

## 2021-06-18 ENCOUNTER — Other Ambulatory Visit: Payer: Self-pay

## 2021-06-22 ENCOUNTER — Other Ambulatory Visit: Payer: Self-pay

## 2021-06-22 ENCOUNTER — Telehealth: Payer: Self-pay | Admitting: Licensed Clinical Social Worker

## 2021-06-22 ENCOUNTER — Other Ambulatory Visit: Payer: Self-pay | Admitting: Gerontology

## 2021-06-22 DIAGNOSIS — F32A Depression, unspecified: Secondary | ICD-10-CM

## 2021-06-22 DIAGNOSIS — F419 Anxiety disorder, unspecified: Secondary | ICD-10-CM

## 2021-06-22 MED ORDER — MIRTAZAPINE 15 MG PO TABS
7.5000 mg | ORAL_TABLET | Freq: Every day | ORAL | 0 refills | Status: DC
  Filled 2021-06-22: qty 15, 30d supply, fill #0

## 2021-06-22 NOTE — Telephone Encounter (Signed)
Returned AS's call from earlier today. Michela Pitcher he is still waiting to hear back from [place]. The [employer] is covering some of his rent, and his landlord gave him a continuance. Will keep looking for options to cover remaining expenses.

## 2021-06-23 ENCOUNTER — Other Ambulatory Visit: Payer: Self-pay

## 2021-06-29 ENCOUNTER — Telehealth: Payer: Self-pay | Admitting: Licensed Clinical Social Worker

## 2021-06-29 NOTE — Telephone Encounter (Signed)
Helped patient set up accounts on [employer] and the social security website to get replacement social security card and drivers license. Will check back in on Thursday to see how things are going.

## 2021-06-29 NOTE — Telephone Encounter (Signed)
Encounter opened in error

## 2021-07-01 ENCOUNTER — Telehealth: Payer: Self-pay | Admitting: Licensed Clinical Social Worker

## 2021-07-01 NOTE — Telephone Encounter (Signed)
Social security application was denied- gave him Social Security office's phone number to inquire why that may have happened. Told him I would check back in on 11/29 if I didn't hear back before I left today.

## 2021-07-02 ENCOUNTER — Encounter: Admission: RE | Admit: 2021-07-02 | Payer: Self-pay | Source: Ambulatory Visit

## 2021-07-05 ENCOUNTER — Other Ambulatory Visit: Payer: Self-pay

## 2021-07-06 ENCOUNTER — Ambulatory Visit: Payer: Medicaid Other | Admitting: Gerontology

## 2021-07-06 ENCOUNTER — Encounter: Payer: Medicaid Other | Admitting: Licensed Clinical Social Worker

## 2021-07-07 ENCOUNTER — Other Ambulatory Visit: Payer: Self-pay

## 2021-07-13 ENCOUNTER — Telehealth: Payer: Self-pay | Admitting: Licensed Clinical Social Worker

## 2021-07-13 NOTE — Telephone Encounter (Signed)
Has not been able to get in touch with the social security office since application was denied. Still looking for ways to pay the $14 for the new driver's license. Needs proof of identity before he can get rental assistance so I said I would make some calls for him to see if I can find out any additional information.

## 2021-07-15 ENCOUNTER — Telehealth: Payer: Self-pay | Admitting: Licensed Clinical Social Worker

## 2021-07-15 NOTE — Telephone Encounter (Signed)
Assisted AS with accessing medical records on MyChart to use as proof of identity during appointment at social security office.

## 2021-07-22 ENCOUNTER — Other Ambulatory Visit: Payer: Self-pay

## 2021-07-22 ENCOUNTER — Ambulatory Visit: Payer: Medicaid Other | Admitting: Licensed Clinical Social Worker

## 2021-07-22 ENCOUNTER — Ambulatory Visit: Payer: Self-pay | Admitting: Licensed Clinical Social Worker

## 2021-07-22 ENCOUNTER — Telehealth: Payer: Self-pay | Admitting: Licensed Clinical Social Worker

## 2021-07-22 DIAGNOSIS — F32A Depression, unspecified: Secondary | ICD-10-CM

## 2021-07-22 DIAGNOSIS — F10182 Alcohol abuse with alcohol-induced sleep disorder: Secondary | ICD-10-CM

## 2021-07-22 NOTE — Telephone Encounter (Signed)
Patient said that his social security appointment went well and that they accepted his application. Still waiting to hear back about driver's license. Asked about getting a 90 day supply of medication instead of just the 30 so I transferred him to [place].

## 2021-07-22 NOTE — BH Specialist Note (Signed)
Integrated Behavioral Health Follow Up Telephone Visit  MRN: [id number] Name: Jason Livingston  Total time: 60 minutes  Types of Service: Telephone visit Patient consents to telephone visit and 2 patient identifiers were used to identify patient   Interpretor:Yes.   Interpretor Name and Language: N/A  Subjective: Jason Livingston is a 55 y.o. male accompanied by  himself  Patient was referred by [facility] for mental health. Patient reports the following symptoms/concerns: The patient reports that he has been doing the same since his last follow up appointment. Favor noted that he was evicted from the boarding house because he was unable to pay his back rent. He shared that he is allowed to sleep on the front porch of the boarding house. He asked if he could get  three months of his medications because he was planning on leaving town to go to [place], [place].  He stated he had a friend who promised him a job if he went there with him. The patient noted he was not taking Mirtazapine as prescribed because if made him feel "goofy and loopy" all day. He asked if he could be prescribed Ambien which his friend takes and recommended it to him. The patient discussed his history and current use of alcohol. The patient noted that he is aware of the programs that [facility] provide. He refused further referrals to the [place] [facility] and [employer]. Jhovany denied any suicidal or homicidal thoughts.  Duration of problem: Years; Severity of problem: moderate  Objective: Mood: Euthymic and Affect: Appropriate Risk of harm to self or others: No plan to harm self or others  Life Context: Family and Social: see above School/Work: see above Self-Care: see above Life Changes: see above  Patient and/or Family's Strengths/Protective Factors: Concrete supports in place (healthy food, safe environments, etc.)  Goals Addressed: Patient will:  Reduce symptoms of: agitation,  depression, insomnia, and stress   Increase knowledge and/or ability of: coping skills, healthy habits, self-management skills, and stress reduction   Demonstrate ability to: Increase healthy adjustment to current life circumstances and Increase adequate support systems for patient/family  Progress towards Goals: Ongoing  Interventions: Interventions utilized:  Supportive Counselingwas utilized by the clinician during today's follow up session. Clinician met with patient to identify needs related to stressors and functioning, and assess and monitor for signs and symptoms of anxiety and depression, and assess safety. The clinician processed with the patient how they have been doing since the last follow-up session. Clinician measured the client's alcohol use, anxiety and depression on a numerical scale. Clinician offered to provide the patient with referrals for community resources to assist with housing , food, and other needs. Clinician offered to relay the patient's medication concerns and requests to his primary care provider. Clinician ended the session with scheduling.  Standardized Assessments completed: AUDIT, GAD-7, and PHQ 9 PHQ-9 =  8 GAD-7= 13 Audit 5 = 5 Assessment: Patient currently experiencing see above.   Patient may benefit from see above.  Plan: Follow up with behavioral health clinician on : 07/29/2021 at 2:00 PM  Behavioral recommendations:  Referral(s): [place] (In Clinic) "From scale of 1-10, how likely are you to follow plan?":   Lesli Albee, LCSWA

## 2021-07-29 ENCOUNTER — Telehealth: Payer: Self-pay | Admitting: Licensed Clinical Social Worker

## 2021-07-29 ENCOUNTER — Telehealth: Payer: Self-pay | Admitting: Nurse Practitioner

## 2021-07-29 ENCOUNTER — Other Ambulatory Visit: Payer: Self-pay

## 2021-07-29 ENCOUNTER — Ambulatory Visit: Payer: Medicaid Other | Admitting: Licensed Clinical Social Worker

## 2021-07-29 ENCOUNTER — Ambulatory Visit: Payer: Self-pay | Admitting: Licensed Clinical Social Worker

## 2021-07-29 DIAGNOSIS — F10982 Alcohol use, unspecified with alcohol-induced sleep disorder: Secondary | ICD-10-CM

## 2021-07-29 NOTE — BH Specialist Note (Signed)
Integrated Behavioral Health Follow Up Telephone Visit  MRN: [id number] Name: Jason Livingston  Total time: 60 minutes  Types of Service: Telephone visit Patient consents to telephone visit and 2 patient identifiers were used to identify patient   Interpretor:No. Interpretor Name and Language: N/A  Subjective: Jason Livingston is a 55 y.o. male accompanied by  himself Patient was referred by [facility] for [facility] health.The patient reports that he has been doing the same since his last follow-up appointment. He noted that he is sleeping on the porch of the boarding house he was recently evicted from. He explained that staying dry has been difficult and his shoes are wet. He shared that he planes to ask a friend to dry his shoes for him tomorrow. Mikeal shared that he ate today and is able to access community resources for food currently. 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 The patient feels he can save up enough money to pay for a room in the boarding house again within a month. Devron denied any suicidal or homicidal thoughts.  Patient reports the following symptoms/concerns:  Duration of problem: Years; Severity of problem: moderate  Objective: Mood: Euthymic and Affect: Appropriate Risk of harm to self or others: No plan to harm self or others  Life Context: Family and Social: see above School/Work: see above Self-Care: see above Life Changes: see above  Patient and/or Family's Strengths/Protective Factors: Social connections and Sense of purpose  Goals Addressed: Patient will:  Reduce symptoms of: agitation, anxiety, depression, insomnia, and stress   Increase knowledge and/or ability of: coping skills, healthy habits, self-management skills, and stress reduction   Demonstrate ability to: Increase healthy adjustment to current life circumstances, Increase adequate support systems  for patient/family, and Decrease self-medicating behaviors  Progress towards Goals: Ongoing  Interventions: Interventions utilized:  CBT Cognitive Behavioral Therapy was utilized by the clinician during today's follow up session. Clinician met with patient to identify needs related to stressors and functioning, and assess and monitor for signs and symptoms of anxiety and depression, and assess safety. The clinician processed with the patient how they have been doing since the last follow-up session.  Clinician intervened with positive regard and optimism to validate client's emotions, and supported client in exploring ways to access community resources. Clinician encouraged the patient to continue to work on calming techniques (e.g.. paced breathing, deep muscle relaxation, and calming imagery) as a strategy for responding appropriately to anxiety and the urge to verbally fight or flee when they occur and move towards increasing the patients self regulation. Clinician provided the patient with community resources; [facility], [employer], [employer], [employer], and [employer]  Standardized Assessments completed: GAD-7 and PHQ 9 PHQ-9=  08 GAD-7=  11  Assessment: Patient currently experiencing see above.   Patient may benefit from see above.  Plan: Follow up with behavioral health clinician on : 08/19/2020 at 6:00 PM  Behavioral recommendations:  Referral(s): [place] (In Clinic) "From scale of 1-10, how likely are you to follow plan?":   Lesli Albee, LCSWA

## 2021-07-29 NOTE — Telephone Encounter (Signed)
Patient declined testing and fu at this time due to no insurance .   Will call back when ready .  Deferred testing in wq and cancelled visits

## 2021-07-29 NOTE — Telephone Encounter (Signed)
Patient's appointment was rescheduled to 6:30pm via telephone on 07/29/21

## 2021-07-30 ENCOUNTER — Ambulatory Visit: Payer: Medicaid Other | Admitting: Nurse Practitioner

## 2021-08-12 ENCOUNTER — Other Ambulatory Visit: Payer: Self-pay

## 2021-08-17 ENCOUNTER — Ambulatory Visit: Payer: Medicaid Other | Admitting: Gerontology

## 2021-08-19 ENCOUNTER — Ambulatory Visit: Payer: Medicaid Other | Admitting: Licensed Clinical Social Worker

## 2021-08-20 ENCOUNTER — Other Ambulatory Visit: Payer: Self-pay

## 2021-08-23 ENCOUNTER — Other Ambulatory Visit: Payer: Self-pay

## 2021-08-26 ENCOUNTER — Ambulatory Visit: Payer: Medicaid Other | Admitting: Licensed Clinical Social Worker

## 2021-09-01 ENCOUNTER — Other Ambulatory Visit: Payer: Self-pay

## 2021-09-01 ENCOUNTER — Ambulatory Visit: Payer: Medicaid Other | Admitting: Licensed Clinical Social Worker

## 2021-09-01 ENCOUNTER — Ambulatory Visit: Payer: Self-pay | Admitting: Licensed Clinical Social Worker

## 2021-09-01 DIAGNOSIS — F411 Generalized anxiety disorder: Secondary | ICD-10-CM

## 2021-09-01 DIAGNOSIS — Z8659 Personal history of other mental and behavioral disorders: Secondary | ICD-10-CM

## 2021-09-01 DIAGNOSIS — F10182 Alcohol abuse with alcohol-induced sleep disorder: Secondary | ICD-10-CM

## 2021-09-01 NOTE — BH Specialist Note (Signed)
Integrated Behavioral Health via Telemedicine Visit  09/01/2021 Jason Livingston [id number]   Total time: 86  Referring Provider: Carlyon Shadow, NP  Patient/Family location: [facility]  [facility] Provider location: [facility] All persons participating in visit: Terik Haughey and Jerrilyn Cairo, MSW, LCSW-A Types of Service: Telephone visit  I connected with [name] via  Telephone or Video Enabled Telemedicine Application  (Video is Caregility application) and verified that I am speaking with the correct person using two identifiers. Discussed confidentiality: Yes   I discussed the limitations of telemedicine and the availability of in person appointments.  Discussed there is a possibility of technology failure and discussed alternative modes of communication if that failure occurs.  Patient and/or legal guardian expressed understanding and consented to Telemedicine visit: Yes   Presenting Concerns: Patient and/or family reports the following symptoms/concerns: The patient reports that he is about the same since his last follow-up appointment. Roan shared that he did receive his social security card and photo ID in the mail. He shared the he is roughing it in the [place] town [employer]. Harlo noted that he is without a tent at this time and finds staying dry the most difficult challenge he has faced this week. The patient noted that he continues to receive SNAP benefits and buys foods that do not require cooking. He stated that this is more expensive but he is able to also receive a  meal occasionally from the shelter or other churches in the area. Sparrow noted that he has not found work yet. He discussed other financial and health related stressors. The patient stated that he has cur back on his alcohol consumption and stated that he is in control of it more this time. He explained that he usually consumes 40 oz a day or less to help him stop shaking and fall  asleep. He shared he is always on guard on the streets and is unable to sleep well. He explained that he plans to go to [place] this week to find help locating work and provide him with income to move back into the boarding house again. Yitzhak shared that he is aware of other resources in the [place] and has used several of them this year. The patient denied any further referrals for community supports. Adebayo denied any suicidal or homicidal thoughts.  Duration of problem: Years; Severity of problem: moderate  Patient and/or Family's Strengths/Protective Factors: Social connections, Sense of purpose, and Physical Health (exercise, healthy diet, medication compliance, etc.)  Goals Addressed: Patient will:  Reduce symptoms of: agitation, insomnia, and stress   Increase knowledge and/or ability of: coping skills, healthy habits, self-management skills, and stress reduction   Demonstrate ability to: Increase healthy adjustment to current life circumstances and Increase adequate support systems for patient/family  Progress towards Goals: Ongoing  Interventions: Interventions utilized:  Supportive Counseling and Link to [employer] utilized by the clinician during today's follow up session. Clinician met with patient to identify needs related to stressors and functioning, and assess and monitor for signs and symptoms of anxiety and depression, and assess safety. The clinician processed with the patient how they have been doing since the last follow-up session. Clinician measured the patient's anxiety and depression on a numerical scale. Clinician strongly reinforced the patient to move towards his plan for working towards a new living situation. Clinician requested the client to make a list of positive effects that maintaining sobriety could have  on his life. Clinician offered  to refer the patient to [place] for job assistance. The session ended in  scheduling. Standardized Assessments completed: GAD-7 and PHQ 9 PHQ-9=    1 GAD-7 = 12   Assessment: Patient currently experiencing see above.   Patient may benefit from see above.  Plan: Follow up with behavioral health clinician on : 09/09/2021 at 3:00 PM  Behavioral recommendations:  Referral(s): [place] (In Clinic)  I discussed the assessment and treatment plan with the patient and/or parent/guardian. They were provided an opportunity to ask questions and all were answered. They agreed with the plan and demonstrated an understanding of the instructions.   They were advised to call back or seek an in-person evaluation if the symptoms worsen or if the condition fails to improve as anticipated.  Lesli Albee, LCSWA

## 2021-09-06 ENCOUNTER — Other Ambulatory Visit: Payer: Self-pay | Admitting: Internal Medicine

## 2021-09-06 ENCOUNTER — Other Ambulatory Visit: Payer: Self-pay

## 2021-09-06 MED ORDER — ASPIRIN EC 81 MG PO TBEC: 81.0000 mg | DELAYED_RELEASE_TABLET | Freq: Every day | ORAL | Status: DC

## 2021-09-09 ENCOUNTER — Ambulatory Visit: Payer: Medicaid Other | Admitting: Licensed Clinical Social Worker

## 2021-09-09 ENCOUNTER — Other Ambulatory Visit: Payer: Self-pay | Admitting: Gerontology

## 2021-09-09 ENCOUNTER — Other Ambulatory Visit: Payer: Self-pay

## 2021-09-09 ENCOUNTER — Ambulatory Visit: Payer: Self-pay | Admitting: Licensed Clinical Social Worker

## 2021-09-09 DIAGNOSIS — Z8709 Personal history of other diseases of the respiratory system: Secondary | ICD-10-CM

## 2021-09-09 DIAGNOSIS — Z8659 Personal history of other mental and behavioral disorders: Secondary | ICD-10-CM

## 2021-09-09 DIAGNOSIS — F411 Generalized anxiety disorder: Secondary | ICD-10-CM

## 2021-09-09 DIAGNOSIS — F10182 Alcohol abuse with alcohol-induced sleep disorder: Secondary | ICD-10-CM

## 2021-09-09 MED ORDER — FLUTICASONE FUROATE-VILANTEROL 200-25 MCG/ACT IN AEPB
1.0000 | INHALATION_SPRAY | Freq: Every day | RESPIRATORY_TRACT | 3 refills | Status: DC
  Filled 2021-09-09 – 2021-09-10 (×2): qty 60, 30d supply, fill #0
  Filled 2021-11-05: qty 120, 60d supply, fill #1
  Filled 2022-02-13: qty 120, 60d supply, fill #2
  Filled 2022-02-14: qty 60, 30d supply, fill #0

## 2021-09-09 NOTE — BH Specialist Note (Signed)
Integrated Behavioral Health via Telemedicine Visit  09/09/2021 Jason Livingston [id number]   Total time: 47  Referring Provider: Carlyon Shadow, Np  Patient/Family location: The patient is homeless; currently in the [facility].  [facility] Provider location: [facility] All persons participating in visit: Jason Livingston and Jason Livingston, MSW, LCSW-A Types of Service: Telephone visit  I connected with Jason Livingston via Telephone or Video Enabled Telemedicine Application  (Video is Caregility application) and verified that I am speaking with the correct person using two identifiers. Discussed confidentiality: Yes   I discussed the limitations of telemedicine and the availability of in person appointments.  Discussed there is a possibility of technology failure and discussed alternative modes of communication if that failure occurs.  Patient and/or legal guardian expressed understanding and consented to Telemedicine visit: Yes   Presenting Concerns: Patient and/or family reports the following symptoms/concerns: The patient reports that he has been doing the same since his last follow-up appointment. He noted that he continues to be homeless and is living in the [place] area. He reports he believes his health is holding up to the increased stress however explained he has two episodes of increased heart rate this week. Jason Livingston shared that he continues to receive SNAP benefits and eats at the local shelter and occasionally at various churches in the area. He stated that he is no longer drinking alcohol except for a few sips of beer before bed to help him fall asleep. He explained that he knows how much he can consume before it becomes a problem and feels he is in control of his drinking at this time. Jason Livingston discussed times in his past where his alcohol use was a struggle for him. Jason Livingston for [facility] be  emailed to him because he did not have a pen or paper. He explained that he has not been able to secure employment yet. The patient reports that he is motivated to get rehoused and is taking steps to make that happen. Choice denied symptoms of depression, or any suicidal or homicidal thoughts.  Duration of problem: Years; Severity of problem: moderate  Patient and/or Family's Strengths/Protective Factors: Social connections and Sense of purpose  Goals Addressed: Patient will:  Reduce symptoms of: agitation, anxiety, depression, insomnia, and stress   Increase knowledge and/or ability of: coping skills, healthy habits, self-management skills, and stress reduction   Demonstrate ability to: Increase healthy adjustment to current life circumstances, Increase adequate support systems for patient/family, and Decrease self-medicating behaviors  Progress towards Goals: Ongoing  Interventions: Interventions utilized:  CBT Cognitive Behavioral Therapywas utilized by the clinician during today's follow up session. Clinician met with patient to identify needs related to stressors and functioning, and assess and monitor for signs and symptoms of anxiety and depression, and assess safety. The clinician processed with the patient how they have been doing since the last follow-up session. Clinician measured the patient's anxiety and depression on a numerical scale. Clinician explored the role of the patient's alcohol use as a contributing factor to the employment and housing problems the patient is currently experiencing. Clinician determined that although the patient denies any alcohol use problem as playing a role in his problems, alternative Livingston indicates there is a alcohol use problem; he was confronted with this Livingston. Clinician emailed the patient [facility] contact Livingston. The session ended with scheduling.   Standardized Assessments completed: GAD-7 and PHQ 9 GAD-7 =   12 PHQ-9 =  01   Assessment: Patient currently experiencing see above.   Patient may benefit from see above.  Plan: Follow up with behavioral health clinician on : 09/16/2021 at 11:00 AM  Behavioral recommendations:  Referral(s): [place] (In Clinic)  I discussed the assessment and treatment plan with the patient and/or parent/guardian. They were provided an opportunity to ask questions and all were answered. They agreed with the plan and demonstrated an understanding of the instructions.   They were advised to call back or seek an in-person evaluation if the symptoms worsen or if the condition fails to improve as anticipated.  Jason Livingston, LCSWA

## 2021-09-10 ENCOUNTER — Other Ambulatory Visit: Payer: Self-pay

## 2021-09-13 ENCOUNTER — Other Ambulatory Visit: Payer: Self-pay

## 2021-09-16 ENCOUNTER — Other Ambulatory Visit: Payer: Self-pay

## 2021-09-16 ENCOUNTER — Ambulatory Visit: Payer: Self-pay | Admitting: Licensed Clinical Social Worker

## 2021-09-16 ENCOUNTER — Ambulatory Visit: Payer: Medicaid Other | Admitting: Licensed Clinical Social Worker

## 2021-09-16 DIAGNOSIS — F411 Generalized anxiety disorder: Secondary | ICD-10-CM

## 2021-09-16 DIAGNOSIS — F10982 Alcohol use, unspecified with alcohol-induced sleep disorder: Secondary | ICD-10-CM

## 2021-09-16 NOTE — BH Specialist Note (Signed)
Integrated Behavioral Health via Telemedicine Visit  09/16/2021 Jason Livingston [id number]   Total time: 32  Referring Provider: Carlyon Shadow, NP  Patient/Family location: The patient is un-housed and is located in [place] [facility] Provider location: [facility] All persons participating in visit: Jason Livingston and Jason Livingston, MSW, LCSW-A Types of Service: Telephone visit  I connected with Jason Livingston  via  Telephone or Video Enabled Telemedicine Application  (Video is Caregility application) and verified that I am speaking with the correct person using two identifiers. Discussed confidentiality: Yes   I discussed the limitations of telemedicine and the availability of in person appointments.  Discussed there is a possibility of technology failure and discussed alternative modes of communication if that failure occurs.  Patient and/or legal guardian expressed understanding and consented to Telemedicine visit: Yes   Presenting Concerns: Patient and/or family reports the following symptoms/concerns: The patient reports that he is doing the same since his last follow-up appointment. He shared that he is working with the [employer] and other local churches and requested referrals to housing resources. He stated that he continues to struggle with sleep because he does not have a safe and secure place to sleep currently. He noted that he has all of his belongings and can not stash them anywhere because they may become ruined in the weather or stolen. He explained this makes it difficult for him to get to different agencies to apply for assistance. Jason Livingston shared he is trying to remain positive. He discussed interpersonal and financial stressors impacting his life currently. He shared he has stopped drawing and spends most of his day just trying to survive the day with the basics of food and finding a dry place to shelter. Jason Livingston denied any suicidal or  homicidal thoughts.  Duration of problem: Yes; Severity of problem: moderate  Patient and/or Family's Strengths/Protective Factors: Concrete supports in place (healthy food, safe environments, etc.)  Goals Addressed: Patient will:  Reduce symptoms of: agitation, anxiety, depression, insomnia, and stress   Increase knowledge and/or ability of: coping skills, healthy habits, self-management skills, and stress reduction   Demonstrate ability to: Increase healthy adjustment to current life circumstances and Increase adequate support systems for patient/family  Progress towards Goals: Ongoing  Interventions: Interventions utilized:  CBT Cognitive Behavioral Therapy was utilized by the clinician during today's follow up session. Clinician met with patient to identify needs related to stressors and functioning, and assess and monitor for signs and symptoms of anxiety and depression, and assess safety. The clinician processed with the patient how they have been doing since the last follow-up session.Clinician measured the patient's anxiety and depression and alcohol use on a numerical scale. Clinician assisted the patient in identifying the distorted schemas and related automatic thoughts that mediate his anxiety response. Clinician determined that the client was unable to identify distorted thoughts provided him  with tentative examples in this area. Clinician provided the patient with referrals to  community agencies such as, [employer] and [employer]. The session ended with scheduling.  Standardized Assessments completed: GAD-7 and PHQ 9 AUDIT =    8  GAD-7 =  14 PHQ-9 =  12   Assessment: Patient currently experiencing see above.   Patient may benefit from see above.  Plan: Follow up with behavioral health clinician on : 09/23/2021 at 4:30 PM  Behavioral recommendations:  Referral(s): [place] (In Clinic)  I discussed the assessment and  treatment plan  with the patient and/or parent/guardian. They were provided an opportunity to ask questions and all were answered. They agreed with the plan and demonstrated an understanding of the instructions.   They were advised to call back or seek an in-person evaluation if the symptoms worsen or if the condition fails to improve as anticipated.  Jason Livingston, LCSWA

## 2021-09-21 ENCOUNTER — Other Ambulatory Visit: Payer: Self-pay

## 2021-09-23 ENCOUNTER — Ambulatory Visit: Payer: Self-pay | Admitting: Licensed Clinical Social Worker

## 2021-09-23 ENCOUNTER — Other Ambulatory Visit: Payer: Self-pay | Admitting: Emergency Medicine

## 2021-09-23 ENCOUNTER — Ambulatory Visit: Payer: Medicaid Other | Admitting: Licensed Clinical Social Worker

## 2021-09-23 ENCOUNTER — Other Ambulatory Visit: Payer: Self-pay

## 2021-09-23 DIAGNOSIS — Z8719 Personal history of other diseases of the digestive system: Secondary | ICD-10-CM

## 2021-09-23 DIAGNOSIS — F10982 Alcohol use, unspecified with alcohol-induced sleep disorder: Secondary | ICD-10-CM

## 2021-09-23 DIAGNOSIS — Z8659 Personal history of other mental and behavioral disorders: Secondary | ICD-10-CM

## 2021-09-23 DIAGNOSIS — F411 Generalized anxiety disorder: Secondary | ICD-10-CM

## 2021-09-23 MED ORDER — OMEPRAZOLE 20 MG PO CPDR
20.0000 mg | DELAYED_RELEASE_CAPSULE | Freq: Every day | ORAL | 0 refills | Status: DC
  Filled 2021-09-23: qty 30, 30d supply, fill #0
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
## 2021-09-24 ENCOUNTER — Other Ambulatory Visit: Payer: Self-pay

## 2021-09-28 ENCOUNTER — Other Ambulatory Visit: Payer: Self-pay

## 2021-09-28 ENCOUNTER — Ambulatory Visit: Payer: Self-pay | Admitting: Licensed Clinical Social Worker

## 2021-09-28 ENCOUNTER — Ambulatory Visit: Payer: Medicaid Other | Admitting: Licensed Clinical Social Worker

## 2021-09-28 DIAGNOSIS — F411 Generalized anxiety disorder: Secondary | ICD-10-CM

## 2021-09-28 DIAGNOSIS — F10982 Alcohol use, unspecified with alcohol-induced sleep disorder: Secondary | ICD-10-CM
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
## 2021-09-29 ENCOUNTER — Other Ambulatory Visit: Payer: Self-pay

## 2021-09-29 ENCOUNTER — Ambulatory Visit: Payer: Medicaid Other | Admitting: Gerontology

## 2021-09-29 ENCOUNTER — Encounter: Payer: Self-pay | Admitting: Gerontology

## 2021-09-29 VITALS — BP 122/86 | HR 104 | Temp 97.6°F | Resp 17 | Ht 73.0 in | Wt 207.3 lb

## 2021-09-29 DIAGNOSIS — I10 Essential (primary) hypertension: Secondary | ICD-10-CM

## 2021-09-29 DIAGNOSIS — E1169 Type 2 diabetes mellitus with other specified complication: Secondary | ICD-10-CM

## 2021-09-29 DIAGNOSIS — Z8719 Personal history of other diseases of the digestive system: Secondary | ICD-10-CM

## 2021-09-29 DIAGNOSIS — R7303 Prediabetes: Secondary | ICD-10-CM

## 2021-09-29 DIAGNOSIS — E785 Hyperlipidemia, unspecified: Secondary | ICD-10-CM

## 2021-09-29 LAB — GLUCOSE, POCT (MANUAL RESULT ENTRY): POC Glucose: 90 mg/dl (ref 70–99)

## 2021-09-29 LAB — POCT GLYCOSYLATED HEMOGLOBIN (HGB A1C): Hemoglobin A1C: 6.2 % — AB (ref 4.0–5.6)

## 2021-09-29 MED ORDER — ROSUVASTATIN CALCIUM 10 MG PO TABS
10.0000 mg | ORAL_TABLET | Freq: Every day | ORAL | 2 refills | Status: DC
  Filled 2021-12-09: qty 90, 90d supply, fill #0

## 2021-09-29 MED ORDER — METFORMIN HCL ER 500 MG PO TB24
1000.0000 mg | ORAL_TABLET | Freq: Every day | ORAL | 2 refills | Status: DC
  Filled 2021-09-29: qty 60, 30d supply, fill #0
  Filled 2022-02-13: qty 60, 30d supply, fill #1
  Filled 2022-02-14: qty 60, 30d supply, fill #0

## 2021-09-29 MED ORDER — METOPROLOL SUCCINATE ER 50 MG PO TB24
50.0000 mg | ORAL_TABLET | Freq: Every day | ORAL | 2 refills | Status: DC
  Filled 2021-12-09: qty 90, 90d supply, fill #0

## 2021-09-29 MED ORDER — LOSARTAN POTASSIUM 25 MG PO TABS
25.0000 mg | ORAL_TABLET | Freq: Every day | ORAL | 3 refills | Status: DC
  Filled 2021-12-09: qty 90, 90d supply, fill #0

## 2021-09-29 MED ORDER — OMEPRAZOLE 20 MG PO CPDR
20.0000 mg | DELAYED_RELEASE_CAPSULE | Freq: Every day | ORAL | 2 refills | Status: DC
  Filled 2021-09-29: qty 30, 30d supply, fill #0
  Filled 2021-12-09: qty 90, 90d supply, fill #0

## 2021-09-29 MED ORDER — METFORMIN HCL ER 500 MG PO TB24: 500.0000 mg | ORAL_TABLET | Freq: Every day | ORAL | 2 refills | Status: DC

## 2021-09-29 NOTE — Patient Instructions (Signed)

## 2021-09-29 NOTE — Progress Notes (Signed)
Established Patient Office Visit  Subjective:  Patient ID: Jason Livingston, male    DOB: [birth year]  Age: 56 y.o. MRN: [id number]  CC:  Chief Complaint  Patient presents with   Follow-up    HPI Jason Livingston  is a 56 y/o male who has history of Prediabetes, Hypertension, Asthma, Anxiety, Depression, GERD present for routine follow up and medication refill. He states that he's compliant with his medications, denies side effects and continues to make healthy lifestyle changes. He was seen by Cardiologist Dr End on 06/16/21 for follow up SVT, and will reschedule when his financial application is active. He denies chest pain shortness of breath and palpitation. He states that his mood is good, denies suicidal nor homicidal ideation. Overall, he states that he's doing well and offers no further complaint.  Past Medical History:  Diagnosis Date   Asthma    GERD (gastroesophageal reflux disease)    Hypertension    Pre-diabetes    Sleep apnea     Past Surgical History:  Procedure Laterality Date   COLONOSCOPY N/A 03/09/2021   Procedure: COLONOSCOPY;  Surgeon: Jonathon Bellows, MD;  Location: [facility] ENDOSCOPY;  Service: Gastroenterology;  Laterality: N/A;   MANDIBLE FRACTURE SURGERY      Family History  Problem Relation Age of Onset   Other Mother        unknown medical history   Other Father        unknwon medical history   Hypertension Maternal Grandmother    Heart attack Maternal Grandfather 76   Heart disease Maternal Grandfather    Other Cousin        kidney transplant    Social History   Socioeconomic History   Marital status: Single    Spouse name: Not on file   Number of children: Not on file   Years of education: Not on file   Highest education level: Not on file  Occupational History   Not on file  Tobacco Use   Smoking status: Every Day    Packs/day: 1.00    Years: 30.00    Pack years: 30.00    Types: Cigarettes   Smokeless tobacco: Former    Quit date: 2012   Vaping Use   Vaping Use: Never used  Substance and Sexual Activity   Alcohol use: Yes    Alcohol/week: 14.0 standard drinks    Types: 14 Cans of beer per week    Comment: 09/29/21 "1 beer per day"; hx of 2 40oz beers at night to sleep, 02/18/21 20oz with dinner everynight   Drug use: Not Currently    Types: Cocaine, Marijuana, LSD    Comment: used in his 20's, has not used in 30 years   Sexual activity: Not Currently  Other Topics Concern   Not on file  Social History Narrative   Not on file   Social Determinants of Health   Financial Resource Strain: High Risk   Difficulty of Paying Living Expenses: Hard  Food Insecurity: Food Insecurity Present   Worried About Charity fundraiser in the Last Year: Sometimes true   Ran Out of Food in the Last Year: Sometimes true  Transportation Needs: Unmet Transportation Needs   Lack of Transportation (Medical): No   Lack of Transportation (Non-Medical): Yes  Physical Activity: Inactive   Days of Exercise per Week: 0 days   Minutes of Exercise per Session: 0 min  Stress: Stress Concern Present   Feeling of Stress : To some extent  Social  Connections: Moderately Isolated   Frequency of Communication with Friends and Family: Once a week   Frequency of Social Gatherings with Friends and Family: Once a week   Attends Religious Services: More than 4 times per year   Active Member of [employer] or Organizations: Yes   Attends Music therapist: More than 4 times per year   Marital Status: Never married  Human resources officer Violence: Unknown   Fear of Current or Ex-Partner: No   Emotionally Abused: No   Physically Abused: Not on file   Sexually Abused: Not on file    Outpatient Medications Prior to Visit  Medication Sig Dispense Refill   albuterol (VENTOLIN HFA) 108 (90 Base) MCG/ACT inhaler Inhale 2 puffs into the lungs every 6 (six) hours as needed for wheezing or shortness of breath. 6.7 g 2   aspirin EC 81 MG tablet Take 1 tablet  (81 mg total) by mouth daily. Swallow whole. 30 tablet    B Complex-C-Folic Acid (B COMPLEX-VITAMIN C-FOLIC ACID) 1 MG tablet Take 1 tablet by mouth once daily with breakfast. 90 tablet 3   diclofenac Sodium (VOLTAREN) 1 % GEL Apply 2 g topically 4 (four) times daily. 2 g 0   fluticasone furoate-vilanterol (BREO ELLIPTA) 200-25 MCG/ACT AEPB Inhale 1 puff into the lungs once daily. 60 each 3   MELATONIN PO Take by mouth. Take 2 gummies at HS PRN     metoprolol succinate (TOPROL XL) 50 MG 24 hr tablet Take 1 tablet (50 mg total) by mouth once daily. 30 tablet 2   thiamine (VITAMIN B-1) 100 MG tablet Take 1 tablet (100 mg total) by mouth once daily. 90 tablet 3   omeprazole (PRILOSEC) 20 MG capsule Take 1 capsule (20 mg total) by mouth once daily. 30 capsule 0   losartan (COZAAR) 25 MG tablet Take 1 tablet (25 mg total) by mouth once daily. (Patient not taking: Reported on 09/29/2021) 30 tablet 3   rosuvastatin (CRESTOR) 10 MG tablet Take 1 tablet (10 mg total) by mouth once daily. (Patient not taking: Reported on 09/29/2021) 30 tablet 2   metFORMIN (GLUCOPHAGE-XR) 500 MG 24 hr tablet Take 1 tablet (500 mg total) by mouth daily with breakfast. (Patient not taking: Reported on 09/29/2021) 30 tablet 2   mirtazapine (REMERON) 15 MG tablet Take (1/2) tablet (7.5 mg total) by mouth once daily at bedtime. 15 tablet 0   No facility-administered medications prior to visit.    No Known Allergies  ROS Review of Systems  Constitutional: Negative.   Eyes: Negative.   Respiratory: Negative.    Cardiovascular: Negative.   Endocrine: Negative.   Skin: Negative.   Neurological: Negative.   Psychiatric/Behavioral: Negative.       Objective:    Physical Exam HENT:     Head: Normocephalic and atraumatic.     Mouth/Throat:     Mouth: Mucous membranes are moist.  Eyes:     Extraocular Movements: Extraocular movements intact.     Conjunctiva/sclera: Conjunctivae normal.     Pupils: Pupils are equal,  round, and reactive to light.  Cardiovascular:     Rate and Rhythm: Normal rate and regular rhythm.     Pulses: Normal pulses.     Heart sounds: Normal heart sounds.  Pulmonary:     Effort: Pulmonary effort is normal.     Breath sounds: Normal breath sounds.  Neurological:     General: No focal deficit present.     Mental Status: He is alert and  oriented to person, place, and time. Mental status is at baseline.  Psychiatric:        Mood and Affect: Mood normal.        Behavior: Behavior normal.        Thought Content: Thought content normal.        Judgment: Judgment normal.    BP 122/86 (BP Location: Right Arm, Patient Position: Sitting, Cuff Size: Large)    Pulse (!) 104    Temp 97.6 F (36.4 C) (Oral)    Resp 17    Ht _0  (1.854 m)    Wt 207 lb 4.8 oz (94 kg)    SpO2 96%    BMI 27.35 kg/m  Wt Readings from Last 3 Encounters:  09/29/21 207 lb 4.8 oz (94 kg)  06/16/21 201 lb (91.2 kg)  04/28/21 202 lb (91.6 kg)     Health Maintenance Due  Topic Date Due   COVID-19 Vaccine (1) Never done   HIV Screening  Never done   Hepatitis C Screening  Never done   TETANUS/TDAP  Never done   Zoster Vaccines- Shingrix (1 of 2) Never done   INFLUENZA VACCINE  Never done    There are no preventive care reminders to display for this patient.  Lab Results  Component Value Date   TSH 2.340 04/25/2021   Lab Results  Component Value Date   WBC 9.4 04/25/2021   HGB 15.0 04/25/2021   HCT 42.9 04/25/2021   MCV 100.7 (H) 04/25/2021   PLT 306 04/25/2021   Lab Results  Component Value Date   NA 142 04/25/2021   K 3.7 04/25/2021   CO2 21 (L) 04/25/2021   GLUCOSE 132 (H) 04/25/2021   BUN 13 04/25/2021   CREATININE 0.59 (L) 04/25/2021   BILITOT 0.5 04/25/2021   ALKPHOS 48 04/25/2021   AST 21 04/25/2021   ALT 31 04/25/2021   PROT 7.2 04/25/2021   ALBUMIN 4.2 04/25/2021   CALCIUM 9.0 04/25/2021   ANIONGAP 13 04/25/2021   EGFR 91 11/25/2020   Lab Results  Component Value Date    CHOL 230 (H) 03/31/2021   Lab Results  Component Value Date   HDL 47 03/31/2021   Lab Results  Component Value Date   LDLCALC 137 (H) 03/31/2021   Lab Results  Component Value Date   TRIG 257 (H) 03/31/2021   Lab Results  Component Value Date   CHOLHDL 4.9 03/31/2021   Lab Results  Component Value Date   HGBA1C 6.2 (A) 09/29/2021      Assessment & Plan:    1. Essential hypertension -His blood pressure is under control, he will continue current medication, DASH diet and exercise as tolerated. - losartan (COZAAR) 25 MG tablet; Take 1 tablet (25 mg total) by mouth once daily.  Dispense: 30 tablet; Refill: 3  2. Pre-diabetes -His hemoglobin A1c was 6.2%, his metformin was increased to 1000 mg daily, he will continue on low carbohydrate/low concentrated sweet diet and exercise as tolerated. - POCT HgB A1C; Future - POCT Glucose (CBG); Future - POCT Glucose (CBG) - POCT HgB A1C - metFORMIN (GLUMETZA) 1000 MG (MOD) 24 hr tablet; Take 1 tablet (1,000 mg total) by mouth daily with breakfast.  Dispense: 30 tablet; Refill: 2  3. History of gastroesophageal reflux (GERD) -His acid reflux is under control and he will continue current medication. -Avoid spicy, fatty and fried food -Avoid sodas and sour juices -Avoid heavy meals -Avoid eating 4 hours before bedtime -Elevate head  of bed at night - omeprazole (PRILOSEC) 20 MG capsule; Take 1 capsule (20 mg total) by mouth once daily.  Dispense: 30 capsule; Refill: 2  4. Hyperlipidemia associated with type 2 diabetes mellitus ([place]) -He will continue on current medication, low fat/cholesterol diet and exercise as tolerated. - rosuvastatin (CRESTOR) 10 MG tablet; Take 1 tablet (10 mg total) by mouth once daily.  Dispense: 30 tablet; Refill: 2     Follow-up: Return in about 3 months (around 12/30/2021), or if symptoms worsen or fail to improve.    Jeriah Skufca Jerold Coombe, NP

## 2021-09-30 ENCOUNTER — Other Ambulatory Visit: Payer: Self-pay

## 2021-10-07 ENCOUNTER — Telehealth: Payer: Self-pay | Admitting: Licensed Clinical Social Worker

## 2021-10-07 ENCOUNTER — Other Ambulatory Visit: Payer: Self-pay

## 2021-10-07 MED ORDER — ASPIRIN 81 MG PO TBEC
81.0000 mg | DELAYED_RELEASE_TABLET | Freq: Every day | ORAL | 0 refills | Status: DC
  Filled 2021-10-07: qty 30, 30d supply, fill #0

## 2021-10-07 NOTE — Telephone Encounter (Signed)
Left numbers for [facility] [phone] and William Dalton at [facility] [phone] to help with housing search.

## 2021-10-07 NOTE — Telephone Encounter (Signed)
30 days prescription for aspirin 81 mg daily sent to medication management clinic.  As patient has not proceeded with testing recommended at our last office visit in 06/2021 and also canceled his follow-up visit, further refills will not be provided without reevaluation in the office.  Nelva Bush, MD [facility]

## 2021-10-07 NOTE — Addendum Note (Signed)
Addended by: Akhila Mahnken A on: 10/07/2021 10:09 AM   Modules accepted: Orders

## 2021-10-11 ENCOUNTER — Other Ambulatory Visit: Payer: Self-pay

## 2021-10-12 ENCOUNTER — Ambulatory Visit: Payer: Self-pay | Admitting: Licensed Clinical Social Worker

## 2021-10-12 ENCOUNTER — Ambulatory Visit: Payer: Medicaid Other | Admitting: Licensed Clinical Social Worker

## 2021-10-12 ENCOUNTER — Other Ambulatory Visit: Payer: Self-pay

## 2021-10-12 DIAGNOSIS — F411 Generalized anxiety disorder: Secondary | ICD-10-CM

## 2021-10-12 DIAGNOSIS — F339 Major depressive disorder, recurrent, unspecified: Secondary | ICD-10-CM

## 2021-10-12 DIAGNOSIS — F10982 Alcohol use, unspecified with alcohol-induced sleep disorder: Secondary | ICD-10-CM
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
## 2021-10-21 ENCOUNTER — Ambulatory Visit: Payer: Medicaid Other | Admitting: Licensed Clinical Social Worker

## 2021-10-25 ENCOUNTER — Other Ambulatory Visit: Payer: Self-pay

## 2021-10-26 ENCOUNTER — Ambulatory Visit: Payer: Self-pay | Admitting: Licensed Clinical Social Worker

## 2021-10-26 ENCOUNTER — Other Ambulatory Visit: Payer: Self-pay

## 2021-10-26 ENCOUNTER — Ambulatory Visit: Payer: Medicaid Other | Admitting: Licensed Clinical Social Worker

## 2021-10-26 DIAGNOSIS — F10982 Alcohol use, unspecified with alcohol-induced sleep disorder: Secondary | ICD-10-CM

## 2021-10-26 DIAGNOSIS — F411 Generalized anxiety disorder: Secondary | ICD-10-CM
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
## 2021-11-03 ENCOUNTER — Ambulatory Visit: Payer: Medicaid Other | Admitting: Licensed Clinical Social Worker

## 2021-11-03 ENCOUNTER — Ambulatory Visit: Payer: Self-pay | Admitting: Licensed Clinical Social Worker

## 2021-11-03 ENCOUNTER — Other Ambulatory Visit: Payer: Self-pay

## 2021-11-03 DIAGNOSIS — F411 Generalized anxiety disorder: Secondary | ICD-10-CM

## 2021-11-03 DIAGNOSIS — F10982 Alcohol use, unspecified with alcohol-induced sleep disorder: Secondary | ICD-10-CM

## 2021-11-03 DIAGNOSIS — F339 Major depressive disorder, recurrent, unspecified: Secondary | ICD-10-CM

## 2021-11-03 NOTE — BH Specialist Note (Signed)
Integrated Behavioral Health via Telemedicine Visit ? ?11/03/2021 ?Jason Livingston ?[id number] ? ?Number of [place] Clinician visits: No data recorded ?Session Start time: No data recorded  ?Session End time: No data recorded ?Total time in minutes: No data recorded ? ?Referring Provider: Carlyon Shadow, NP ?Patient/Family location: [place]  ?[facility] Provider location: [facility] [place] ?All persons participating in visit: Jason Livingston and Jerrilyn Cairo, LCSW-A ?Types of Service: Telephone visit ? ?I connected with [name]  Telephone or Video Enabled Telemedicine Application  (Video is Caregility application) and verified that I am speaking with the correct person using two identifiers. Discussed confidentiality: Yes  ? ?I discussed the limitations of telemedicine and the availability of in person appointments.  Discussed there is a possibility of technology failure and discussed alternative modes of communication if that failure occurs. ? ?Patient and/or legal guardian expressed understanding and consented to Telemedicine visit: Yes  ? ?Presenting Concerns: ?Patient and/or family reports the following symptoms/concerns: The patient reports that he has been doing about the same since his last follow-up appointment. [name] noted that his housing situation has not changed since his last follow-up appointment. He shared that he sleeps in the downtown [place] area wherever. He noted that he continues to struggle with falling asleep and remaining asleep. He stated that he would like the clinician to speak to his primary care provider regarding medication options. He noted that he was previously taking mirtazapine but felt that it was too strong and would leave him feeling loopy even hours after waking up. The patient stated that he has previously tried Melatonin gummiest, but they did not work. [name] noted that he has very low energy throughout his day. He  explained that he has not followed up on previous referral to community service agencies. He stated that he plans on doing so soon. [name] noted that other than the sniffles he is doing well overall. The patient denied any suicidal or homicidal thoughts.  ?Duration of problem: Years ; Severity of problem: moderate ? ?Patient and/or Family's Strengths/Protective Factors: ?Concrete supports in place (healthy food, safe environments, etc.) ? ?Goals Addressed: ?Patient will: ? Reduce symptoms of: agitation, anxiety, depression, insomnia, and stress  ? Increase knowledge and/or ability of: coping skills, healthy habits, self-management skills, and stress reduction  ? Demonstrate ability to: Increase healthy adjustment to current life circumstances ? ?Progress towards Goals: ?Ongoing ? ?Interventions: ?Interventions utilized:  CBT Cognitive Behavioral Therapy was utilized by the clinician during today's follow up session. Clinician met with patient to identify needs related to stressors and functioning, and assess and monitor for signs and symptoms of anxiety and depression, and assess safety. The clinician processed with the patient how they have been doing since the last follow-up session. Clinician measured the patient's depression and anxiety on a numerical scale. Clinician used cognitive re-framing to help the client see accessing community resources as a positive step to becoming rehoused. Clinician encouraged the patient to utilize their coping skills to deal with their current life circumstances. The session ended with scheduling.  ?Standardized Assessments completed: GAD-7 and PHQ 9 ?GAD-7= 11 ?PHQ-9= 10 ? ? ?Assessment: ?Patient currently experiencing see above.  ? ?Patient may benefit from see above. ? ?Plan: ?Follow up with behavioral health clinician on : 10/16/2021 at 12:00 noon ?Behavioral recommendations:  ?Referral(s): [place] (In Clinic) ? ?I discussed the assessment and  treatment plan with the patient and/or parent/guardian. They were provided an opportunity to ask questions  and all were answered. They agreed with the plan and demonstrated an understanding of the instructions. ?  ?They were advised to call back or seek an in-person evaluation if the symptoms worsen or if the condition fails to improve as anticipated. ? ?Lesli Albee, LCSWA ?

## 2021-11-03 NOTE — BH Specialist Note (Deleted)
Integrated Behavioral Health via Telemedicine Visit ? ?11/03/2021 ?Jason Livingston ?[id number] ? ?Number of [place] Clinician visits: No data recorded ?Session Start time: No data recorded  ?Session End time: No data recorded ?Total time in minutes: No data recorded ? ?Referring Provider: *** ?Patient/Family location: *** ?[facility] Provider location: *** ?All persons participating in visit: *** ?Types of Service: {CHL AMB TYPE OF [phone]} ? ?I connected with Jason Livingston and/or Jason Livingston's {family members:20773} via  Telephone or Video Enabled Telemedicine Application  (Video is Caregility application) and verified that I am speaking with the correct person using two identifiers. Discussed confidentiality: {YES/NO:21197} ? ?I discussed the limitations of telemedicine and the availability of in person appointments.  Discussed there is a possibility of technology failure and discussed alternative modes of communication if that failure occurs. ? ?I discussed that engaging in this telemedicine visit, they consent to the provision of behavioral healthcare and the services will be billed under their insurance. ? ?Patient and/or legal guardian expressed understanding and consented to Telemedicine visit: {YES/NO:21197} ? ?Presenting Concerns: ?Patient and/or family reports the following symptoms/concerns: *** ?Duration of problem: ***; Severity of problem: {Mild/Moderate/Severe:20260} ? ?Patient and/or Family's Strengths/Protective Factors: ?{CHL AMB BH PROTECTIVE FACTORS:[phone]} ? ?Goals Addressed: ?Patient will: ? Reduce symptoms of: {IBH Symptoms:21014056}  ? Increase knowledge and/or ability of: {IBH Patient Tools:21014057}  ? Demonstrate ability to: {IBH Goals:21014053} ? ?Progress towards Goals: ?{CHL AMB BH PROGRESS TOWARDS NWGNF:[id number]} ? ?Interventions: ?Interventions utilized:  {IBH Interventions:21014054} ?Standardized Assessments completed: {IBH Screening  Tools:21014051} ? ?Patient and/or Family Response: *** ? ?Assessment: ?Patient currently experiencing ***.  ? ?Patient may benefit from ***. ? ?Plan: ?Follow up with behavioral health clinician on : *** ?Behavioral recommendations: *** ?Referral(s): {IBH Referrals:21014055} ? ?I discussed the assessment and treatment plan with the patient and/or parent/guardian. They were provided an opportunity to ask questions and all were answered. They agreed with the plan and demonstrated an understanding of the instructions. ?  ?They were advised to call back or seek an in-person evaluation if the symptoms worsen or if the condition fails to improve as anticipated. ? ?Jason Livingston, LCSWA ?

## 2021-11-05 ENCOUNTER — Other Ambulatory Visit: Payer: Self-pay

## 2021-11-16 ENCOUNTER — Ambulatory Visit: Payer: Medicaid Other | Admitting: Licensed Clinical Social Worker

## 2021-11-18 ENCOUNTER — Ambulatory Visit: Payer: Self-pay | Admitting: Licensed Clinical Social Worker

## 2021-11-18 ENCOUNTER — Ambulatory Visit: Payer: Medicaid Other | Admitting: Licensed Clinical Social Worker

## 2021-11-18 DIAGNOSIS — F411 Generalized anxiety disorder: Secondary | ICD-10-CM

## 2021-11-18 DIAGNOSIS — F339 Major depressive disorder, recurrent, unspecified: Secondary | ICD-10-CM

## 2021-11-18 NOTE — BH Specialist Note (Signed)
Integrated Behavioral Health via Telemedicine Visit ? ?11/18/2021 ?Jason Livingston ?[id number] ? ? ?Referring Provider: See above ?Patient/Family location: [place]  ?[facility] Provider location: [facility] [place] ?All persons participating in visit: Jason Livingston and Jason Cairo, LCSW-A ?Types of Service: Telephone visit ? ?I connected with Jason Livingston via Telephone or Video Enabled Telemedicine Application  (Video is Caregility application) and verified that I am speaking with the correct person using two identifiers. Discussed confidentiality: Yes  ? ?I discussed the limitations of telemedicine and the availability of in person appointments.  Discussed there is a possibility of technology failure and discussed alternative modes of communication if that failure occurs. ? ?Patient and/or legal guardian expressed understanding and consented to Telemedicine visit: Yes  ? ?Presenting Concerns: ?Patient and/or family reports the following symptoms/concerns: The patient reports that he has been doing well since his last follow-up appointment. He noted that he was at the [employer] and rented a cubby and was able to speak in private for today's appointment.Jason Livingston noted that he has been experiencing low energy during the day because he has not been sleeping well at night. He shared that he remains homeless and mainly camps in the downtown [place] area wherever he can find a dry space. The patient noted that he does not feel bad about his situation because he does not feel his previous history of alcohol misuse created the problems he is having. Instead he feels his body is giving out making it impossible for him to work. Jason Livingston discussed other health related stressors impacting his life. He shared that he sometimes struggles with irritability but most of time feels fine.The patient denied any suicidal or homicidal thoughts.  ?Duration of problem: Years ; Severity of problem:  moderate ? ?Patient and/or Family's Strengths/Protective Factors: ?Sense of purpose and Physical Health (exercise, healthy diet, medication compliance, etc.) ? ?Goals Addressed: ?Patient will: ? Reduce symptoms of: agitation, anxiety, depression, insomnia, and stress  ? Increase knowledge and/or ability of: coping skills, healthy habits, self-management skills, and stress reduction  ? Demonstrate ability to: Increase healthy adjustment to current life circumstances and Increase adequate support systems for patient/family ? ?Progress towards Goals: ?Ongoing ? ?Interventions: ?Interventions utilized:  CBT Cognitive Behavioral Therapywas utilized by the clinician during today's follow up session. Clinician met with patient to identify needs related to stressors and functioning, and assess and monitor for signs and symptoms of anxiety and depression, and assess safety. The clinician processed with the patient how they have been doing since the last follow-up session. Clinician measured the patient's anxiety and depression on a numerical scale. Clinician encouraged the patient to reach out to the referrals for community resources he had received and explored barriers to the patient accessing those resources. The session ended with scheduling. Jason Livingston Kitchen  ?Standardized Assessments completed: GAD-7 and PHQ 9 ?PHQ-9= 6 ?GAD-7=7 ? ?Assessment: ?Patient currently experiencing see above.  ? ?Patient may benefit from see above. ? ?Plan: ?Follow up with behavioral health clinician on :  12/01/2021 at 12:00 noon ?Behavioral recommendations:  ?Referral(s): [place] (In Clinic) ? ?I discussed the assessment and treatment plan with the patient and/or parent/guardian. They were provided an opportunity to ask questions and all were answered. They agreed with the plan and demonstrated an understanding of the instructions. ?  ?They were advised to call back or seek an in-person evaluation if the symptoms worsen or if  the condition fails to improve as anticipated. ? ?Lesli Albee, LCSWA ?

## 2021-11-25 ENCOUNTER — Telehealth: Payer: Self-pay | Admitting: Licensed Clinical Social Worker

## 2021-11-25 NOTE — Telephone Encounter (Signed)
Called to check in regarding need for resources and housing. Jason Livingston that he called [employer] but they couldn't help him until he filed for disability. Gave phone number for [facility]ocational [facility].  ?

## 2021-11-30 ENCOUNTER — Encounter
Admission: RE | Admit: 2021-11-30 | Discharge: 2021-11-30 | Disposition: A | Discharge status: Home / Self Care | Payer: Self-pay | Source: Ambulatory Visit | Attending: Internal Medicine | Admitting: Internal Medicine

## 2021-11-30 DIAGNOSIS — R079 Chest pain, unspecified: Secondary | ICD-10-CM | POA: Insufficient documentation

## 2021-12-01 ENCOUNTER — Telehealth: Payer: Self-pay | Admitting: Internal Medicine

## 2021-12-01 ENCOUNTER — Ambulatory Visit: Payer: Medicaid Other | Admitting: Licensed Clinical Social Worker

## 2021-12-01 NOTE — Telephone Encounter (Signed)
-----   Message from Nelva Bush, MD sent at 11/30/2021  6:48 PM EDT ----- ?Regarding: Clinic follow-up ?Hello, ? ?Jason Livingston presented for his Myoview yesterday but could not complete it because he had consumed coffee and smoked.  He would need to abstain from caffeine and nicotine for at least 12 hours in order to perform the test, which he reports he is unable to do.  As it has been 5 months since he was last seen in our office, could you arrange for him to see me or an APP to reassess his symptoms and discuss alternative testing, if needed?  Thank you. ? ?Gerald Stabs End ? ?

## 2021-12-01 NOTE — Telephone Encounter (Signed)
LVM to schedule

## 2021-12-06 ENCOUNTER — Other Ambulatory Visit: Payer: Self-pay

## 2021-12-07 ENCOUNTER — Ambulatory Visit: Payer: Self-pay | Admitting: Licensed Clinical Social Worker

## 2021-12-07 ENCOUNTER — Ambulatory Visit: Payer: Medicaid Other | Admitting: Licensed Clinical Social Worker

## 2021-12-07 DIAGNOSIS — F411 Generalized anxiety disorder: Secondary | ICD-10-CM

## 2021-12-07 DIAGNOSIS — F10982 Alcohol use, unspecified with alcohol-induced sleep disorder: Secondary | ICD-10-CM

## 2021-12-07 DIAGNOSIS — F339 Major depressive disorder, recurrent, unspecified: Secondary | ICD-10-CM

## 2021-12-07 NOTE — BH Specialist Note (Signed)
Integrated Behavioral Health via Telemedicine Visit ? ?12/07/2021 ?Jason Livingston ?[id number] ? ?Number of [place] Clinician visits: No data recorded ?Session Start time: No data recorded  ?Session End time: No data recorded ?Total time in minutes: No data recorded ? ?Referring Provider: Carlyon Shadow, NP  ?Livingston/Family location: [place]  ?[facility] Provider location: [facility] [place] ?All persons participating in visit: Demeco Ducksworth and Jerrilyn Cairo, LCSW-A  ?Types of Service: Telephone visit ? ?I connected with [name] via  Telephone or Video Enabled Telemedicine Application  (Video is Caregility application) and verified that I am speaking with Jason correct person using two identifiers. Discussed confidentiality: Yes  ? ?I discussed Jason limitations of telemedicine and Jason availability of in person appointments.  Discussed there is a possibility of technology failure and discussed alternative modes of communication if that failure occurs. ? ?Livingston and/or legal guardian expressed understanding and consented to Telemedicine visit: Yes  ? ?Presenting Concerns: ?Livingston and/or family reports Jason following symptoms/concerns: Jason Livingston  ?Duration of problem: Years ; Severity of problem: moderate ? ?Livingston and/or Family's Strengths/Protective Factors: ?was utilized by Jason clinician during today's follow up session. Jason Livingston reports that he has been doing better sine his last follow-up appointment. 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 Jason Livingston noted that he has hope that things will start to look up now that he has shelter and is earning a little bit of money to get by on. Arturo stated that he understood that Jason psychiatric case consultant recommended that he start gabapentin 100  MG before bed and that he can reach out to his primary care provider or pharmacist if he has any questions or concerns as well. Jason Livingston denied any suicidal or homicidal thoughts.  ?Goals Addressed: ?Livingston will: ? Reduce symptoms of: agitation, anxiety, depression, insomnia, and stress  ? Increase knowledge and/or ability of: coping skills, healthy habits, self-management skills, and stress reduction  ? Demonstrate ability to: Increase healthy adjustment to current life circumstances and Increase adequate support systems for Livingston/family ? ?Progress towards Goals: ?Ongoing ? ?Interventions: ?Interventions utilized:  Supportive Counselingwas utilized by Jason clinician during today's follow up session. Clinician met with Livingston to identify needs related to stressors and functioning, and assess and monitor for signs and symptoms of anxiety and depression, and assess safety. Jason clinician processed with Jason Livingston how they have been doing since Jason last follow-up session. Clinician congratulated Jason Livingston for making progress towards reaching his goal of being housed. Clinician explained to Jason Livingston that she took his request for medication to help him sleep to case consultation with his primary care provider Carlyon Shadow, NP and Dr. Octavia Heir MD psychiatric consultant and it is recommended that Jason Livingston begin taking gabapentin 100 MG before bedtime nightly to help with sleep. Clinician utilized guidance and supervision, provided by psychiatric consultant, to inform Livingston of Jason benefits and risks of psychotropic medication use including a verbal summary of [place] warnings. Livingston was given Jason opportunity to ask questions and address concerns regarding Jason psychiatric consultants recommendations for treatment. Jason session ended with scheduling. ?Standardized Assessments completed: GAD-7 and PHQ 9 ?PHQ-9=6 ?GAD-7= 6 ? ?Assessment: ?Livingston currently experiencing see above.  ? ?Livingston may benefit  from see  above. ? ?Plan: ?Follow up with behavioral health clinician on : 12/30/2021 at 3:00 PM  ?Behavioral recommendations:  ?Referral(s): [place] (In Clinic) ? ?I discussed Jason assessment and treatment plan with Jason Livingston and/or parent/guardian. They were provided an opportunity to ask questions and all were answered. They agreed with Jason plan and demonstrated an understanding of Jason instructions. ?  ?They were advised to call back or seek an in-person evaluation if Jason symptoms worsen or if Jason condition fails to improve as anticipated. ? ?Lesli Albee, LCSWA ?

## 2021-12-09 ENCOUNTER — Other Ambulatory Visit: Payer: Self-pay

## 2021-12-09 ENCOUNTER — Other Ambulatory Visit: Payer: Self-pay | Admitting: Internal Medicine

## 2021-12-09 MED ORDER — ASPIRIN 81 MG PO TBEC
81.0000 mg | DELAYED_RELEASE_TABLET | Freq: Every day | ORAL | 0 refills | Status: DC
  Filled 2021-12-09: qty 30, 30d supply, fill #0

## 2021-12-10 ENCOUNTER — Other Ambulatory Visit: Payer: Self-pay

## 2021-12-13 ENCOUNTER — Other Ambulatory Visit: Payer: Self-pay

## 2021-12-17 ENCOUNTER — Other Ambulatory Visit: Payer: Self-pay

## 2021-12-27 ENCOUNTER — Encounter: Payer: Self-pay | Admitting: Medical

## 2021-12-27 ENCOUNTER — Other Ambulatory Visit
Admission: RE | Admit: 2021-12-27 | Discharge: 2021-12-27 | Disposition: A | Discharge status: Home / Self Care | Payer: Medicaid Other | Attending: Medical | Admitting: Medical

## 2021-12-27 ENCOUNTER — Other Ambulatory Visit: Payer: Self-pay

## 2021-12-27 ENCOUNTER — Ambulatory Visit (INDEPENDENT_AMBULATORY_CARE_PROVIDER_SITE_OTHER): Payer: Self-pay | Admitting: Medical

## 2021-12-27 VITALS — BP 142/90 | HR 85 | Ht 73.0 in | Wt 214.0 lb

## 2021-12-27 DIAGNOSIS — Z720 Tobacco use: Secondary | ICD-10-CM

## 2021-12-27 DIAGNOSIS — R072 Precordial pain: Secondary | ICD-10-CM | POA: Insufficient documentation

## 2021-12-27 DIAGNOSIS — I10 Essential (primary) hypertension: Secondary | ICD-10-CM

## 2021-12-27 DIAGNOSIS — R079 Chest pain, unspecified: Secondary | ICD-10-CM

## 2021-12-27 DIAGNOSIS — I739 Peripheral vascular disease, unspecified: Secondary | ICD-10-CM

## 2021-12-27 DIAGNOSIS — R0602 Shortness of breath: Secondary | ICD-10-CM

## 2021-12-27 DIAGNOSIS — E785 Hyperlipidemia, unspecified: Secondary | ICD-10-CM

## 2021-12-27 DIAGNOSIS — I471 Supraventricular tachycardia: Secondary | ICD-10-CM

## 2021-12-27 DIAGNOSIS — E1169 Type 2 diabetes mellitus with other specified complication: Secondary | ICD-10-CM

## 2021-12-27 LAB — BASIC METABOLIC PANEL
Anion gap: 8 (ref 5–15)
BUN: 16 mg/dL (ref 6–20)
CO2: 25 mmol/L (ref 22–32)
Calcium: 9.4 mg/dL (ref 8.9–10.3)
Chloride: 105 mmol/L (ref 98–111)
Creatinine, Ser: 0.86 mg/dL (ref 0.61–1.24)
GFR, Estimated: >60 mL/min (ref >60)
Glucose, Bld: 109 mg/dL — ABNORMAL HIGH (ref 70–99)
Potassium: 4.4 mmol/L (ref 3.5–5.1)
Sodium: 138 mmol/L (ref 135–145)

## 2021-12-27 MED ORDER — METOPROLOL SUCCINATE ER 100 MG PO TB24
100.0000 mg | ORAL_TABLET | Freq: Every day | ORAL | 5 refills | Status: DC
Start: 2021-12-27 — End: 2022-11-29
  Filled 2021-12-27 – 2021-12-30 (×2): qty 30, 30d supply, fill #0
  Filled 2022-02-13: qty 30, 30d supply, fill #1
  Filled 2022-02-14: qty 30, 30d supply, fill #0

## 2021-12-27 MED ORDER — METOPROLOL TARTRATE 25 MG PO TABS
25.0000 mg | ORAL_TABLET | Freq: Every day | ORAL | 0 refills | Status: DC | PRN
  Filled 2021-12-27 – 2022-01-03 (×5): qty 30, 30d supply, fill #0

## 2021-12-27 NOTE — Progress Notes (Signed)
?Cardiology Office Note:   ? ?Date:  12/28/2021  ? ?ID:  Jason Livingston, DOB September 09, 1965, MRN [id number] ? ?PCP:  Langston Reusing, NP  ?[facility] Cardiologist:  Dr. Saunders Revel ?[place] Electrophysiologist:  None  ? ?Referring MD: Langston Reusing, NP  ? ?Chief Complaint: F/u post-testing ? ?History of Present Illness:   ? ?Jason Livingston is a 56 y.o. male with a hx of SVT, HTN, prediabetes, and alcohol abuse who presents for follow-up.  ? ?He was seen in the ED September 2022 for palpitations due to SVT, treated with adenosine with minimal troponin elevation. He was started on metoprolol '25mg'$  daily. Echo was ordered which showed LVEF G1DD, mild MR, and mild MAE.  ? ?Last seen 06/16/21 and noted some improvement in symptoms of his palpitations. Metoprolol was increased to '50mg'$  daily. He also reported b/l claudication and ABIs, US aorta/IVC/iliac, lower extremity US were ordered, but not performed. Myoview stress test was ordered for chest pain and SOB, but this was not completed due to inability to not smoke for 24 hours.  ? ?Today, the patient reports occasional palpitations. Better since being on metoprolol. Most of his work is physical. Sometimes he has to rest and wait for palpitations to pass, says the might last 25 minutes. Has associated SOB. Has also noted high BP .BP today 142/90, says it's higher at home. No Lle, orthopnea, pnd or chest pain.  ? ?Past Medical History:  ?Diagnosis Date  ? Asthma   ? GERD (gastroesophageal reflux disease)   ? Hypertension   ? Pre-diabetes   ? Sleep apnea   ? ? ?Past Surgical History:  ?Procedure Laterality Date  ? COLONOSCOPY N/A 03/09/2021  ? Procedure: COLONOSCOPY;  Surgeon: Jonathon Bellows, MD;  Location: [facility] ENDOSCOPY;  Service: Gastroenterology;  Laterality: N/A;  ? MANDIBLE FRACTURE SURGERY    ? ? ?Current Medications: ?Current Meds  ?Medication Sig  ? albuterol (VENTOLIN HFA) 108 (90 Base) MCG/ACT inhaler Inhale 2 puffs into the lungs every 6 (six) hours as  needed for wheezing or shortness of breath.  ? aspirin (ASPIRIN ADULT LOW STRENGTH) 81 MG EC tablet Take 1 tablet (81 mg total) by mouth once daily. Swallow whole.  ? B Complex-C-Folic Acid (B COMPLEX-VITAMIN C-FOLIC ACID) 1 MG tablet Take 1 tablet by mouth once daily with breakfast.  ? diclofenac Sodium (VOLTAREN) 1 % GEL Apply 2 g topically 4 (four) times daily.  ? fluticasone furoate-vilanterol (BREO ELLIPTA) 200-25 MCG/ACT AEPB Inhale 1 puff into the lungs once daily.  ? losartan (COZAAR) 25 MG tablet Take 1 tablet (25 mg total) by mouth once daily.  ? metFORMIN (GLUCOPHAGE-XR) 500 MG 24 hr tablet Take 2 tablets (1,000 mg total) by mouth once daily with breakfast.  ? metoprolol tartrate (LOPRESSOR) 25 MG tablet Take 1 tablet (25 mg total) by mouth once daily as needed. (For elevated heart rates or SVT).  ? omeprazole (PRILOSEC) 20 MG capsule Take 1 capsule (20 mg total) by mouth once daily.  ? rosuvastatin (CRESTOR) 10 MG tablet Take 1 tablet (10 mg total) by mouth once daily.  ? thiamine (VITAMIN B-1) 100 MG tablet Take 1 tablet (100 mg total) by mouth once daily.  ? [DISCONTINUED] metoprolol succinate (TOPROL XL) 50 MG 24 hr tablet Take 1 tablet (50 mg total) by mouth once daily.  ?  ? ?Allergies:   Patient has no known allergies.  ? ?Social History  ? ?Socioeconomic History  ? Marital status: Single  ?  Spouse name: Not  on file  ? Number of children: Not on file  ? Years of education: Not on file  ? Highest education level: Not on file  ?Occupational History  ? Not on file  ?Tobacco Use  ? Smoking status: Every Day  ?  Packs/day: 1.00  ?  Years: 30.00  ?  Pack years: 30.00  ?  Types: Cigarettes  ? Smokeless tobacco: Former  ?  Quit date: 2012  ?Vaping Use  ? Vaping Use: Never used  ?Substance and Sexual Activity  ? Alcohol use: Yes  ?  Alcohol/week: 14.0 standard drinks  ?  Types: 14 Cans of beer per week  ?  Comment: 09/29/21 "1 beer per day"; hx of 2 40oz beers at night to sleep, 02/18/21 20oz with dinner  everynight  ? Drug use: Not Currently  ?  Types: Cocaine, Marijuana, LSD  ?  Comment: used in his 20's, has not used in 30 years  ? Sexual activity: Not Currently  ?Other Topics Concern  ? Not on file  ?Social History Narrative  ? Not on file  ? ?Social Determinants of Health  ? ?Financial Resource Strain: High Risk  ? Difficulty of Paying Living Expenses: Hard  ?Food Insecurity: Food Insecurity Present  ? Worried About Charity fundraiser in the Last Year: Sometimes true  ? Ran Out of Food in the Last Year: Sometimes true  ?Transportation Needs: Unmet Transportation Needs  ? Lack of Transportation (Medical): No  ? Lack of Transportation (Non-Medical): Yes  ?Physical Activity: Inactive  ? Days of Exercise per Week: 0 days  ? Minutes of Exercise per Session: 0 min  ?Stress: Stress Concern Present  ? Feeling of Stress : To some extent  ?Social Connections: Moderately Isolated  ? Frequency of Communication with Friends and Family: Once a week  ? Frequency of Social Gatherings with Friends and Family: Once a week  ? Attends Religious Services: More than 4 times per year  ? Active Member of Clubs or Organizations: Yes  ? Attends Archivist Meetings: More than 4 times per year  ? Marital Status: Never married  ?  ? ?Family History: ?The patient's family history includes Heart attack (age of onset: 57) in his maternal grandfather; Heart disease in his maternal grandfather; Hypertension in his maternal grandmother; Other in his cousin, father, and mother. ? ?ROS:   ?Please see the history of present illness.    ? All other systems reviewed and are negative. ? ?EKGs/Labs/Other Studies Reviewed:   ? ?The following studies were reviewed today: ? ?Echo 05/2021 ? 1. Left ventricular ejection fraction, by estimation, is 60 to 65%. The  ?left ventricle has normal function. The left ventricle has no regional  ?wall motion abnormalities. Left ventricular diastolic parameters are  ?consistent with Grade I diastolic   ?dysfunction (impaired relaxation).  ? 2. Right ventricular systolic function is normal. The right ventricular  ?size is normal. There is normal pulmonary artery systolic pressure. The  ?estimated right ventricular systolic pressure is 28.3 mmHg.  ? 3. Left atrial size was mildly dilated.  ? 4. The mitral valve is normal in structure. Mild mitral valve  ?regurgitation. No evidence of mitral stenosis.  ? 5. The aortic valve was not well visualized. Aortic valve regurgitation  ?is not visualized. No aortic stenosis is present.  ? 6. There is borderline dilatation of the aortic root, measuring 36 mm.  ?Ascending aorta 3.4 cm  ? 7. The inferior vena cava is normal in size  with greater than 50%  ?respiratory variability, suggesting right atrial pressure of 3 mmHg.  ? ?EKG:  EKG is  ordered today.  The ekg ordered today demonstrates NSR 85 bpm, TWI aVL, no changes ? ?Recent Labs: ?04/25/2021: ALT 31; Hemoglobin 15.0; Magnesium 2.0; Platelets 306; TSH 2.340 ?12/27/2021: BUN 16; Creatinine, Ser 0.86; Potassium 4.4; Sodium 138  ?Recent Lipid Panel ?   ?Component Value Date/Time  ? CHOL 230 (H) 03/31/2021 1234  ? TRIG 257 (H) 03/31/2021 1234  ? HDL 47 03/31/2021 1234  ? CHOLHDL 4.9 03/31/2021 1234  ? LDLCALC 137 (H) 03/31/2021 1234  ? ? ?Physical Exam:   ? ?VS:  BP (!) 142/90 (BP Location: Left Arm, Patient Position: Sitting, Cuff Size: Normal)   Pulse 85   Ht '6\' 1"'$  (1.854 m)   Wt 214 lb (97.1 kg)   SpO2 96%   BMI 28.23 kg/m?    ? ?Wt Readings from Last 3 Encounters:  ?12/27/21 214 lb (97.1 kg)  ?09/29/21 207 lb 4.8 oz (94 kg)  ?06/16/21 201 lb (91.2 kg)  ?  ? ?GEN:  Well nourished, well developed in no acute distress ?HEENT: Normal ?NECK: No JVD; No carotid bruits ?LYMPHATICS: No lymphadenopathy ?CARDIAC: RRR, no murmurs, rubs, gallops ?RESPIRATORY:  Clear to auscultation without rales, wheezing or rhonchi  ?ABDOMEN: Soft, non-tender, non-distended ?MUSCULOSKELETAL:  No edema; No deformity  ?SKIN: Warm and  dry ?NEUROLOGIC:  Alert and oriented x 3 ?PSYCHIATRIC:  Normal affect  ? ?ASSESSMENT:   ? ?1. SVT (supraventricular tachycardia) ([place])   ?2. Precordial pain   ?3. Chest pain, unspecified type   ?4. Shortness of breath   ?5. Claudication

## 2021-12-27 NOTE — Patient Instructions (Signed)
Medication Instructions:  ?Your physician has recommended you make the following change in your medication:  ? ?INCREASE Metoprolol Succinate to 100 mg daily. An Rx has been sent to your pharmacy. ? ?START Metoprolol tartrate 25 mg daily as needed for elevated heart rates or svt. An Rx has been sent to your pharmacy ? ?*If you need a refill on your cardiac medications before your next appointment, please call your pharmacy* ? ? ?Lab Work: ?BMP today ? ?Please have your lab drawn at the [facility]. Stop at the Registration desk to check in. ? ?If you have labs (blood work) drawn today and your tests are completely normal, you will receive your results only by: ?MyChart Message (if you have MyChart) OR ?A paper copy in the mail ?If you have any lab test that is abnormal or we need to change your treatment, we will call you to review the results. ? ? ?Testing/Procedures: ?Your physician has requested that you have cardiac CT. Cardiac computed tomography (CT) is a painless test that uses an x-ray machine to take clear, detailed pictures of your heart. For further information please visit [email or web address]. Please follow instruction sheet as given. ? ?Your physician has requested that you have a lower extremity arterial exercise duplex. During this test, exercise and ultrasound are used to evaluate arterial blood flow in the legs. Allow one hour for this exam. There are no restrictions or special instructions. ? ?Your physician has requested that you have a aorta/ivc/iliac duplex.  ? ?Follow-Up: ?At [facility], you and your health needs are our priority.  As part of our continuing mission to provide you with exceptional heart care, we have created designated Provider Care Teams.  These Care Teams include your primary Cardiologist (physician) and Advanced Practice Providers (APPs -  Physician Assistants and Nurse Practitioners) who all work together to provide you with the care you need, when you need it. ? ?We  recommend signing up for the patient portal called "MyChart".  Sign up information is provided on this After Visit Summary.  MyChart is used to connect with patients for Virtual Visits (Telemedicine).  Patients are able to view lab/test results, encounter notes, upcoming appointments, etc.  Non-urgent messages can be sent to your provider as well.   ?To learn more about what you can do with MyChart, go to [email or web address].   ? ?Your next appointment:   ?2 month(s) ? ?The format for your next appointment:   ?In Person ? ?Provider:   ?You may see Nelva Bush, MD or one of the following Advanced Practice Providers on your designated Care Team:   ?Murray Hodgkins, NP ?Christell Faith, PA-C ?Cadence Kathlen Mody, PA-C ? ?Other Instructions ? ? ?Your cardiac CT will be scheduled at one of the below location:  ? ? ?[address] ?[address] ?[place], [place] [address] ?[phone] ? ?I ? ?If scheduled at [facility], please arrive 15 mins early for check-in and test prep. ? ?Please follow these instructions carefully (unless otherwise directed): ? ?Hold all erectile dysfunction medications at least 3 days (72 hrs) prior to test. ? ?On the Night Before the Test: ?Be sure to Drink plenty of water. ?Do not consume any caffeinated/decaffeinated beverages or chocolate 12 hours prior to your test. ? ?On the Day of the Test: ?Drink plenty of water until 1 hour prior to the test. ?Do not eat any food 4 hours prior to the test. ?You may take your regular medications prior to  the test.  ?Take metoprolol (Lopressor) two hours prior to test. ? ? ?     ?After the Test: ?Drink plenty of water. ?After receiving IV contrast, you may experience a mild flushed feeling. This is normal. ?On occasion, you may experience a mild rash up to 24 hours after the test. This is not dangerous. If this occurs, you can take Benadryl 25 mg and increase your fluid intake. ?If you  experience trouble breathing, this can be serious. If it is severe call 911 IMMEDIATELY. If it is mild, please call our office. ?If you take any of these medications: Glipizide/Metformin, Avandament, Glucavance, please do not take 48 hours after completing test unless otherwise instructed. ? ? ? ?We will call to schedule your test 2-4 weeks out understanding that some insurance companies will need an authorization prior to the service being performed.  ? ?For non-scheduling related questions, please contact the cardiac imaging nurse navigator should you have any questions/concerns: ?Marchia Bond, Cardiac Imaging Nurse Navigator ?Gordy Clement, Cardiac Imaging Nurse Navigator ?[facility] ?Direct Office Dial: [phone]  ? ?For scheduling needs, including cancellations and rescheduling, please call [place], [phone]. ? ? ?Important Information About Sugar ? ? ? ? ? ? ?

## 2021-12-28 ENCOUNTER — Other Ambulatory Visit: Payer: Self-pay

## 2021-12-29 ENCOUNTER — Telehealth: Payer: Self-pay | Admitting: Pharmacist

## 2021-12-29 NOTE — Telephone Encounter (Signed)
12/29/2021 10:39:02 AM - [facility] Ellipta refill call to [place] Order# [id number] ?-- Arletha Pili - Wednesday, Dec 29, 2021 10:42 AM -- ? ?Breo Ellipta refill call to [place] Order# [id number] ?

## 2021-12-30 ENCOUNTER — Encounter: Payer: Medicaid Other | Admitting: Licensed Clinical Social Worker

## 2021-12-30 ENCOUNTER — Telehealth: Payer: Self-pay | Admitting: Licensed Clinical Social Worker

## 2021-12-30 ENCOUNTER — Encounter: Payer: Self-pay | Admitting: Gerontology

## 2021-12-30 ENCOUNTER — Other Ambulatory Visit: Payer: Self-pay

## 2021-12-30 ENCOUNTER — Ambulatory Visit: Payer: Medicaid Other | Admitting: Gerontology

## 2021-12-30 VITALS — BP 124/81 | HR 80 | Temp 98.0°F | Resp 16 | Ht 73.0 in | Wt 213.6 lb

## 2021-12-30 DIAGNOSIS — E1169 Type 2 diabetes mellitus with other specified complication: Secondary | ICD-10-CM

## 2021-12-30 DIAGNOSIS — R7303 Prediabetes: Secondary | ICD-10-CM

## 2021-12-30 DIAGNOSIS — F17200 Nicotine dependence, unspecified, uncomplicated: Secondary | ICD-10-CM

## 2021-12-30 MED ORDER — ROSUVASTATIN CALCIUM 10 MG PO TABS
10.0000 mg | ORAL_TABLET | Freq: Every day | ORAL | 2 refills | Status: DC
  Filled 2021-12-30: qty 30, 30d supply, fill #0

## 2021-12-30 NOTE — Telephone Encounter (Signed)
Called the patient twice during today's scheduled appointment; no answer, left a voicemail with the clinic contact information so they may reschedule.   

## 2021-12-30 NOTE — Progress Notes (Signed)
Established Patient Office Visit  Subjective   Patient ID: Jason Livingston, male    DOB: 11-25-1965  Age: 56 y.o. MRN: [id number]  Chief Complaint  Patient presents with   Follow-up    HPI  Jason Livingston  is a 56 y/o male who has history of Prediabetes, Hypertension, Asthma, Anxiety, Depression, GERD present for routine follow up and medication refill. He states that he's compliant with his medications, and denies side effects. He continues to smoke 1 pack of cigarette daily and admits the desire to quit. He followed up at the Cardiology clinic on 12/27/21 by [employer]. PA-C . His Toprol was increased to 100 mg daily, 20 tablets of Lopressor 25 mg as needed, Cardiac CTA, ABI will be scheduled , will continue on Aspirin and 10 mg Crestor and he will follow up in 1 month. Overall, he states that he's doing well and offers no further complaint.  Review of Systems  Constitutional: Negative.   Respiratory: Negative.    Cardiovascular: Negative.   Neurological: Negative.   Endo/Heme/Allergies: Negative.   Psychiatric/Behavioral: Negative.       Objective:     BP 124/81 (BP Location: Left Arm, Patient Position: Sitting, Cuff Size: Large)   Pulse 80   Temp 98 F (36.7 C) (Oral)   Resp 16   Ht '6\' 1"'$  (1.854 m)   Wt 213 lb 9.6 oz (96.9 kg)   SpO2 93%   BMI 28.18 kg/m  BP Readings from Last 3 Encounters:  12/30/21 124/81  12/27/21 (!) 142/90  09/29/21 122/86   Wt Readings from Last 3 Encounters:  12/30/21 213 lb 9.6 oz (96.9 kg)  12/27/21 214 lb (97.1 kg)  09/29/21 207 lb 4.8 oz (94 kg)      Physical Exam HENT:     Head: Normocephalic and atraumatic.     Mouth/Throat:     Mouth: Mucous membranes are moist.  Eyes:     Extraocular Movements: Extraocular movements intact.     Conjunctiva/sclera: Conjunctivae normal.     Pupils: Pupils are equal, round, and reactive to light.  Cardiovascular:     Rate and Rhythm: Normal rate and regular rhythm.     Pulses: Normal pulses.      Heart sounds: Normal heart sounds.  Pulmonary:     Effort: Pulmonary effort is normal.     Breath sounds: Normal breath sounds.  Musculoskeletal:        General: Normal range of motion.  Skin:    General: Skin is warm.  Neurological:     General: No focal deficit present.     Mental Status: He is alert and oriented to person, place, and time. Mental status is at baseline.  Psychiatric:        Mood and Affect: Mood normal.        Behavior: Behavior normal.        Thought Content: Thought content normal.        Judgment: Judgment normal.     No results found for any visits on 12/30/21.  Last CBC Lab Results  Component Value Date   WBC 9.4 04/25/2021   HGB 15.0 04/25/2021   HCT 42.9 04/25/2021   MCV 100.7 (H) 04/25/2021   MCH 35.2 (H) 04/25/2021   RDW 13.0 04/25/2021   PLT 306 38/25/0539   Last metabolic panel Lab Results  Component Value Date   GLUCOSE 109 (H) 12/27/2021   NA 138 12/27/2021   K 4.4 12/27/2021   CL 105 12/27/2021  CO2 25 12/27/2021   BUN 16 12/27/2021   CREATININE 0.86 12/27/2021   GFRNONAA >60 12/27/2021   CALCIUM 9.4 12/27/2021   PROT 7.2 04/25/2021   ALBUMIN 4.2 04/25/2021   LABGLOB 2.6 11/25/2020   AGRATIO 1.7 11/25/2020   BILITOT 0.5 04/25/2021   ALKPHOS 48 04/25/2021   AST 21 04/25/2021   ALT 31 04/25/2021   ANIONGAP 8 12/27/2021   Last lipids Lab Results  Component Value Date   CHOL 230 (H) 03/31/2021   HDL 47 03/31/2021   LDLCALC 137 (H) 03/31/2021   TRIG 257 (H) 03/31/2021   CHOLHDL 4.9 03/31/2021   Last hemoglobin A1c Lab Results  Component Value Date   HGBA1C 6.2 (A) 09/29/2021      The 10-year ASCVD risk score (Arnett DK, et al., 2019) is: 24.2%    Assessment & Plan:   1. Hyperlipidemia associated with type 2 diabetes mellitus ([place]) - He will continue on current medication, low fat/cholesterol diet. - rosuvastatin (CRESTOR) 10 MG tablet; Take 1 tablet (10 mg total) by mouth once daily.  Dispense: 30 tablet;  Refill: 2 - Lipid panel; Future  2. Smoking - Encouraged smoking cessation, was provided with [place] Quitline information  3. Pre-diabetes - He will continue on current medication and will recheck HgbA1c. - CBC w/Diff; Future - HgB A1c; Future    Return in about 3 months (around 04/05/2022), or if symptoms worsen or fail to improve.    Lera Gaines Jerold Coombe, NP

## 2021-12-30 NOTE — Patient Instructions (Signed)
Heart-Healthy Eating Plan Heart-healthy meal planning includes: Eating less unhealthy fats. Eating more healthy fats. Making other changes in your diet. Talk with your doctor or a diet specialist (dietitian) to create an eating plan that is right for you. What is my plan? Your doctor may recommend an eating plan that includes: Total fat: ______% or less of total calories a day. Saturated fat: ______% or less of total calories a day. Cholesterol: less than _________mg a day. What are tips for following this plan? Cooking Avoid frying your food. Try to bake, boil, grill, or broil it instead. You can also reduce fat by: Removing the skin from poultry. Removing all visible fats from meats. Steaming vegetables in water or broth. Meal planning  At meals, divide your plate into four equal parts: Fill one-half of your plate with vegetables and green salads. Fill one-fourth of your plate with whole grains. Fill one-fourth of your plate with lean protein foods. Eat 4-5 servings of vegetables per day. A serving of vegetables is: 1 cup of raw or cooked vegetables. 2 cups of raw leafy greens. Eat 4-5 servings of fruit per day. A serving of fruit is: 1 medium whole fruit.  cup of dried fruit.  cup of fresh, frozen, or canned fruit.  cup of 100% fruit juice. Eat more foods that have soluble fiber. These are apples, broccoli, carrots, beans, peas, and barley. Try to get 20-30 g of fiber per day. Eat 4-5 servings of nuts, legumes, and seeds per week: 1 serving of dried beans or legumes equals  cup after being cooked. 1 serving of nuts is  cup. 1 serving of seeds equals 1 tablespoon. General information Eat more home-cooked food. Eat less restaurant, buffet, and fast food. Limit or avoid alcohol. Limit foods that are high in starch and sugar. Avoid fried foods. Lose weight if you are overweight. Keep track of how much salt (sodium) you eat. This is important if you have high blood  pressure. Ask your doctor to tell you more about this. Try to add vegetarian meals each week. Fats Choose healthy fats. These include olive oil and canola oil, flaxseeds, walnuts, almonds, and seeds. Eat more omega-3 fats. These include salmon, mackerel, sardines, tuna, flaxseed oil, and ground flaxseeds. Try to eat fish at least 2 times each week. Check food labels. Avoid foods with trans fats or high amounts of saturated fat. Limit saturated fats. These are often found in animal products, such as meats, butter, and cream. These are also found in plant foods, such as palm oil, palm kernel oil, and coconut oil. Avoid foods with partially hydrogenated oils in them. These have trans fats. Examples are stick margarine, some tub margarines, cookies, crackers, and other baked goods. What foods can I eat? Fruits All fresh, canned (in natural juice), or frozen fruits. Vegetables Fresh or frozen vegetables (raw, steamed, roasted, or grilled). Green salads. Grains Most grains. Choose whole wheat and whole grains most of the time. Rice and pasta, including brown rice and pastas made with whole wheat. Meats and other proteins Lean, well-trimmed beef, veal, pork, and lamb. Chicken and turkey without skin. All fish and shellfish. Wild duck, rabbit, pheasant, and venison. Egg whites or low-cholesterol egg substitutes. Dried beans, peas, lentils, and tofu. Seeds and most nuts. Dairy Low-fat or nonfat cheeses, including ricotta and mozzarella. Skim or 1% milk that is liquid, powdered, or evaporated. Buttermilk that is made with low-fat milk. Nonfat or low-fat yogurt. Fats and oils Non-hydrogenated (trans-free) margarines. Vegetable oils, including   soybean, sesame, sunflower, olive, peanut, safflower, corn, canola, and cottonseed. Salad dressings or mayonnaise made with a vegetable oil. Beverages Mineral water. Coffee and tea. Diet carbonated beverages. Sweets and desserts Sherbet, gelatin, and fruit ice.  Small amounts of dark chocolate. Limit all sweets and desserts. Seasonings and condiments All seasonings and condiments. The items listed above may not be a complete list of foods and drinks you can eat. Contact a dietitian for more options. What foods should I avoid? Fruits Canned fruit in heavy syrup. Fruit in cream or butter sauce. Fried fruit. Limit coconut. Vegetables Vegetables cooked in cheese, cream, or butter sauce. Fried vegetables. Grains Breads that are made with saturated or trans fats, oils, or whole milk. Croissants. Sweet rolls. Donuts. High-fat crackers, such as cheese crackers. Meats and other proteins Fatty meats, such as hot dogs, ribs, sausage, bacon, rib-eye roast or steak. High-fat deli meats, such as salami and bologna. Caviar. Domestic duck and goose. Organ meats, such as liver. Dairy Cream, sour cream, cream cheese, and creamed cottage cheese. Whole-milk cheeses. Whole or 2% milk that is liquid, evaporated, or condensed. Whole buttermilk. Cream sauce or high-fat cheese sauce. Yogurt that is made from whole milk. Fats and oils Meat fat, or shortening. Cocoa butter, hydrogenated oils, palm oil, coconut oil, palm kernel oil. Solid fats and shortenings, including bacon fat, salt pork, lard, and butter. Nondairy cream substitutes. Salad dressings with cheese or sour cream. Beverages Regular sodas and juice drinks with added sugar. Sweets and desserts Frosting. Pudding. Cookies. Cakes. Pies. Milk chocolate or white chocolate. Buttered syrups. Full-fat ice cream or ice cream drinks. The items listed above may not be a complete list of foods and drinks to avoid. Contact a dietitian for more information. Summary Heart-healthy meal planning includes eating less unhealthy fats, eating more healthy fats, and making other changes in your diet. Eat a balanced diet. This includes fruits and vegetables, low-fat or nonfat dairy, lean protein, nuts and legumes, whole grains, and  heart-healthy oils and fats. This information is not intended to replace advice given to you by your health care provider. Make sure you discuss any questions you have with your health care provider. Document Revised: 12/10/2020 Document Reviewed: 12/10/2020 Elsevier Patient Education  2022 [employer] Inc.  

## 2021-12-31 ENCOUNTER — Other Ambulatory Visit: Payer: Self-pay

## 2022-01-03 ENCOUNTER — Other Ambulatory Visit: Payer: Self-pay

## 2022-01-04 ENCOUNTER — Other Ambulatory Visit: Payer: Self-pay

## 2022-01-05 ENCOUNTER — Other Ambulatory Visit: Payer: Self-pay

## 2022-01-07 ENCOUNTER — Other Ambulatory Visit: Payer: Self-pay

## 2022-01-10 NOTE — Addendum Note (Signed)
Encounter addended by: Annie Paras on: 01/10/2022 11:24 AM  Actions taken: Letter saved

## 2022-01-12 ENCOUNTER — Telehealth (HOSPITAL_COMMUNITY): Payer: Self-pay | Admitting: Emergency Medicine

## 2022-01-12 NOTE — Telephone Encounter (Signed)
Reaching out to patient to offer assistance regarding upcoming cardiac imaging study; pt verbalizes understanding of appt date/time, parking situation and where to check in, pre-test NPO status and medications ordered, and verified current allergies; name and call back number provided for further questions should they arise Marchia Bond RN [facility] [phone] office [phone] cell  One vein that works (left arm) Arrival 1215 Having him take '100mg'$  metoprolol tartrate

## 2022-01-13 ENCOUNTER — Ambulatory Visit
Admission: RE | Admit: 2022-01-13 | Discharge: 2022-01-13 | Disposition: A | Discharge status: Home / Self Care | Payer: Medicaid Other | Source: Ambulatory Visit | Attending: Medical | Admitting: Medical

## 2022-01-13 DIAGNOSIS — R072 Precordial pain: Secondary | ICD-10-CM

## 2022-01-13 MED ORDER — IOHEXOL 350 MG/ML SOLN
75.0000 mL | Freq: Once | INTRAVENOUS | Status: AC | PRN
  Administered 2022-01-13: 75 mL via INTRAVENOUS

## 2022-01-13 MED ORDER — NITROGLYCERIN 0.4 MG SL SUBL
0.8000 mg | SUBLINGUAL_TABLET | Freq: Once | SUBLINGUAL | Status: AC
Start: 2022-01-13 — End: 2022-01-13
  Administered 2022-01-13: 0.8 mg via SUBLINGUAL

## 2022-01-13 NOTE — Congregational Nurse Program (Signed)
  Dept: [phone]   Congregational Nurse Program Note  Date of Encounter: 01/13/2022 Client to clinic today in need of a dental referral. Application for [facility] dentistry completed and emailed. RN to also contact [facility] to investigate a referral to [place] [facility]. Discussed the need for reapplication for [facility] care. New application will need to be completed. RN requested client to return to clinic for assistance with forms. Past Medical History: Past Medical History:  Diagnosis Date   Asthma    GERD (gastroesophageal reflux disease)    Hypertension    Pre-diabetes    Sleep apnea     Encounter Details:  CNP Questionnaire - 01/13/22 1130       Questionnaire   Do you give verbal consent to treat you today? Yes    Location Patient [place] or Organization    Patient Status Homeless    Insurance Uninsured ([employer]/Self-Pay)   had [employer] care which expired on 10/22, needs to reapply   Insurance Referral [facility]    Medication N/A   medication from medication management   Medical Provider Yes   [facility]   Screening Referrals N/A    Medical Referral N/A    Medical Appointment Made N/A    Food Have Food Insecurities   client uses Food pantry and food stamps   Transportation Need transportation assistance    Housing/Utilities No permanent housing   lives in a boarding house   Interpersonal Safety N/A    Intervention Blood pressure;Support;Educate    ED Visit Averted N/A    Life-Saving Intervention Made N/A

## 2022-01-13 NOTE — Progress Notes (Signed)
Patient tolerated procedure well. Ambulate w/o difficulty. Denies light headedness or being dizzy. Sitting in chair drinking water and coffee provided. Encouraged to drink extra water today and reasoning explained. Verbalized understanding. All questions answered. ABC intact. No further needs. Discharge from procedure area w/o issues.

## 2022-01-20 ENCOUNTER — Encounter: Payer: Self-pay | Admitting: *Deleted

## 2022-01-20 DIAGNOSIS — Z006 Encounter for examination for normal comparison and control in clinical research program: Secondary | ICD-10-CM

## 2022-01-20 NOTE — Research (Signed)
Spoke with Mr Ruacho about [employer]. States he would like more information. Emailed him a copy of the consent to review.

## 2022-01-24 ENCOUNTER — Encounter: Payer: Self-pay | Admitting: *Deleted

## 2022-01-24 DIAGNOSIS — Z006 Encounter for examination for normal comparison and control in clinical research program: Secondary | ICD-10-CM

## 2022-01-24 NOTE — Research (Signed)
Spoke with Mr Jason Livingston about [employer]  study. He states he has not been able to read over the information yet. I will call him at a later time to see if he has any questions.

## 2022-01-25 ENCOUNTER — Other Ambulatory Visit: Payer: Self-pay

## 2022-01-28 ENCOUNTER — Encounter: Payer: Self-pay | Admitting: *Deleted

## 2022-01-28 DIAGNOSIS — Z006 Encounter for examination for normal comparison and control in clinical research program: Secondary | ICD-10-CM

## 2022-01-28 NOTE — Patient Instructions (Signed)
Spoke with Jason Livingston about essence research . He is interested in coming in. He states that he will have to find a ride to [place], but he wanted to go ahead and schedule an appointment.

## 2022-02-01 ENCOUNTER — Telehealth: Payer: Self-pay | Admitting: Acute Care

## 2022-02-01 NOTE — Telephone Encounter (Signed)
Reached pt to schedule annual LDCT. He is interested in doing it but insurance has changed to [facility] but pt did not have the insurance card handy. Took program number and will call us back with information to schedule LDCT

## 2022-02-07 ENCOUNTER — Encounter: Payer: Self-pay | Admitting: *Deleted

## 2022-02-07 DIAGNOSIS — Z006 Encounter for examination for normal comparison and control in clinical research program: Secondary | ICD-10-CM

## 2022-02-08 ENCOUNTER — Other Ambulatory Visit: Payer: Self-pay

## 2022-02-14 ENCOUNTER — Other Ambulatory Visit: Payer: Self-pay

## 2022-02-17 ENCOUNTER — Other Ambulatory Visit: Payer: Self-pay

## 2022-03-07 ENCOUNTER — Ambulatory Visit: Payer: Self-pay | Admitting: Medical

## 2022-03-07 ENCOUNTER — Encounter: Payer: Self-pay | Admitting: Medical

## 2022-03-07 NOTE — Progress Notes (Deleted)
Cardiology Office Note:    Date:  03/07/2022   ID:  Jason Livingston, DOB 24-Jan-1966, MRN [id number]  PCP:  Langston Reusing, NP  [facility] Cardiologist:  None  [facility] Electrophysiologist:  None   Referring MD: Langston Reusing, NP   Chief Complaint: 2 month follow-up  History of Present Illness:    Jason Livingston is a 56 y.o. male with a hx of SVT, HTN, prediabetes, and alcohol abuse who presents for follow-up.    He was seen in the ED September 2022 for palpitations due to SVT, treated with adenosine with minimal troponin elevation. He was started on metoprolol '25mg'$  daily. Echo was ordered which showed LVEF G1DD, mild MR, and mild MAE.    Seen 06/16/21 and noted some improvement in symptoms of his palpitations. Metoprolol was increased to '50mg'$  daily. He also reported b/l claudication and ABIs, US aorta/IVC/iliac, lower extremity US were ordered, but not performed. Myoview stress test was ordered for chest pain and SOB, but this was not completed due to inability to not smoke for 24 hours.   Las seen 12/27/21 and reported occasional palpitations, chest pain and claudication. Toprol was increased and he was given Lopressor x 20 pills to use as needed. Cardiac CTA was ordered and ABIs and aortoiliac [place] were rescheduled.   Cardiac cTA showed calcium score of 159, 87th percentile for age and sex matched. Mild LAD stenosis and minimal RCA stenosis.   Today,     Past Medical History:  Diagnosis Date   Asthma    GERD (gastroesophageal reflux disease)    Hypertension    Pre-diabetes    Sleep apnea     Past Surgical History:  Procedure Laterality Date   COLONOSCOPY N/A 03/09/2021   Procedure: COLONOSCOPY;  Surgeon: Jonathon Bellows, MD;  Location[facility] END[facility]COPY;  Service: Gastroenterology;  Laterality: N/A;   MANDIBLE FRACTURE SURGERY      Current Medications: No outpatient medications have been marked as taking for the 03/07/22 encounter (Appointment) with Kathlen Mody,  Huong Luthi H, PA-C.     Allergies:   Patient has no known allergies.   Social History   Socioeconomic History   Marital status: Single    Spouse name: Not on file   Number of children: Not on file   Years of education: Not on file   Highest education level: Not on file  Occupational History   Not on file  Tobacco Use   Smoking status: Every Day    Packs/day: 1.00    Years: 30.00    Total pack years: 30.00    Types: Cigarettes   Smokeless tobacco: Former    Quit date: 2012  Vaping Use   Vaping Use: Never used  Substance and Sexual Activity   Alcohol use: Not Currently    Alcohol/week: 14.0 standard drinks of alcohol    Types: 14 Cans of beer per week    Comment: last use 11/2021, 09/29/21 "1 beer per day"; hx of 2 40oz beers at night to sleep, 02/18/21 20oz with dinner everynight   Drug use: Not Currently    Types: Cocaine, Marijuana, LSD    Comment: used in his 20's, has not used in 30 years   Sexual activity: Not Currently  Other Topics Concern   Not on file  Social History Narrative   Not on file   Social Determinants of Health   Financial Resource Strain: High Risk (05/20/2021)   Overall Financial Resource Strain (CARDIA)    Difficulty of Paying Living  Expenses: Hard  Food Insecurity: Food Insecurity Present (05/20/2021)   Hunger Vital Sign    Worried About Running Out of Food in the Last Year: Sometimes true    Ran Out of Food in the Last Year: Sometimes true  Transportation Needs: Unmet Transportation Needs (05/20/2021)   PRAPARE - Hydrologist (Medical): No    Lack of Transportation (Non-Medical): Yes  Physical Activity: Inactive (05/20/2021)   Exercise Vital Sign    Days of Exercise per Week: 0 days    Minutes of Exercise per Session: 0 min  Stress: Stress Concern Present (05/20/2021)   [place]    Feeling of Stress : To some extent  Social Connections: Moderately  Isolated (05/20/2021)   Social Connection and Isolation Panel [NHANES]    Frequency of Communication with Friends and Family: Once a week    Frequency of Social Gatherings with Friends and Family: Once a week    Attends Religious Services: More than 4 times per year    Active Member of [employer] or Organizations: Yes    Attends Music therapist: More than 4 times per year    Marital Status: Never married     Family History: The patient's family history includes Heart attack (age of onset: 55) in his maternal grandfather; Heart disease in his maternal grandfather; Hypertension in his maternal grandmother; Other in his cousin, father, and mother.  ROS:   Please see the history of present illness.     All other systems reviewed and are negative.  EKGs/Labs/Other Studies Reviewed:    The following studies were reviewed today:  Cardiac CTA 01/2022 IMPRESSION: 1. Coronary calcium score of 159. This was 87th percentile for age and sex matched control.   2. Normal coronary origin with right dominance.   3. Mild proximal LAD stenosis (25%).   4. Minimal proximal RCA stenosis (<25%).   5. CAD-RADS 2. Mild non-obstructive CAD (25-49%). Consider non-atherosclerotic causes of chest pain. Consider preventive therapy and risk factor modification.  Echo 05/2021  1. Left ventricular ejection fraction, by estimation, is 60 to 65%. The  left ventricle has normal function. The left ventricle has no regional  wall motion abnormalities. Left ventricular diastolic parameters are  consistent with Grade I diastolic  dysfunction (impaired relaxation).   2. Right ventricular systolic function is normal. The right ventricular  size is normal. There is normal pulmonary artery systolic pressure. The  estimated right ventricular systolic pressure is 06.2 mmHg.   3. Left atrial size was mildly dilated.   4. The mitral valve is normal in structure. Mild mitral valve  regurgitation. No evidence  of mitral stenosis.   5. The aortic valve was not well visualized. Aortic valve regurgitation  is not visualized. No aortic stenosis is present.   6. There is borderline dilatation of the aortic root, measuring 36 mm.  Ascending aorta 3.4 cm   7. The inferior vena cava is normal in size with greater than 50%  respiratory variability, suggesting right atrial pressure of 3 mmHg.     EKG:  EKG is *** ordered today.  The ekg ordered today demonstrates ***  Recent Labs: 04/25/2021: ALT 31; Hemoglobin 15.0; Magnesium 2.0; Platelets 306; TSH 2.340 12/27/2021: BUN 16; Creatinine, Ser 0.86; Potassium 4.4; Sodium 138  Recent Lipid Panel    Component Value Date/Time   CHOL 230 (H) 03/31/2021 1234   TRIG 257 (H) 03/31/2021 1234   HDL 47  03/31/2021 1234   CHOLHDL 4.9 03/31/2021 1234   LDLCALC 137 (H) 03/31/2021 1234     Risk Assessment/Calculations:   {Does this patient have ATRIAL FIBRILLATION?:[id number]}   Physical Exam:    VS:  There were no vitals taken for this visit.    Wt Readings from Last 3 Encounters:  12/30/21 213 lb 9.6 oz (96.9 kg)  12/27/21 214 lb (97.1 kg)  09/29/21 207 lb 4.8 oz (94 kg)     GEN: *** Well nourished, well developed in no acute distress HEENT: Normal NECK: No JVD; No carotid bruits LYMPHATICS: No lymphadenopathy CARDIAC: ***RRR, no murmurs, rubs, gallops RESPIRATORY:  Clear to auscultation without rales, wheezing or rhonchi  ABDOMEN: Soft, non-tender, non-distended MUSCULOSKELETAL:  No edema; No deformity  SKIN: Warm and dry NEUROLOGIC:  Alert and oriented x 3 PSYCHIATRIC:  Normal affect   ASSESSMENT:    No diagnosis found. PLAN:    In order of problems listed above:  pSVT  Chest pain and SOB  Claudication  HLD  HTN  Tobacco use  Disposition: Follow up {follow up:15908} with ***   Shared Decision Making/Informed Consent   {Are you ordering a CV Procedure (e.g. stress test, cath, DCCV, TEE, etc)?   Press F2        :[id number]}     Signed, Gaylyn Berish Ninfa Meeker, PA-C  03/07/2022 1:40 PM    [facility]

## 2022-03-30 ENCOUNTER — Other Ambulatory Visit: Payer: Medicaid Other

## 2022-03-30 ENCOUNTER — Telehealth: Payer: Self-pay

## 2022-03-30 NOTE — Telephone Encounter (Signed)
Called pt to schedule appt with Brannan Cassedy. Left Vmail.

## 2022-04-05 ENCOUNTER — Ambulatory Visit: Payer: Medicaid Other | Admitting: Gerontology

## 2022-04-12 ENCOUNTER — Telehealth: Payer: Self-pay | Admitting: Gerontology

## 2022-04-12 NOTE — Telephone Encounter (Signed)
Called to make pt appt with Heather. No answer.

## 2022-04-25 ENCOUNTER — Other Ambulatory Visit: Payer: Self-pay

## 2022-04-29 ENCOUNTER — Other Ambulatory Visit: Payer: Self-pay

## 2022-05-05 ENCOUNTER — Telehealth: Payer: Self-pay | Admitting: Gerontology

## 2022-05-05 NOTE — Telephone Encounter (Signed)
Called pt to R/S with Heather. No answer. Unable to leave VM.

## 2022-05-25 ENCOUNTER — Telehealth: Payer: Self-pay | Admitting: Gerontology

## 2022-05-25 NOTE — Telephone Encounter (Signed)
Called pt 2nd time to make appt. No answer. Unavailable to leave msg.

## 2022-06-28 ENCOUNTER — Telehealth: Payer: Self-pay | Admitting: Gerontology

## 2022-06-28 NOTE — Telephone Encounter (Signed)
Called pt to make appt with Heather. No Vmail set up.

## 2022-08-17 ENCOUNTER — Telehealth: Payer: Self-pay | Admitting: Gerontology

## 2022-08-17 NOTE — Telephone Encounter (Signed)
End of therapy letter sent to pt to inform pt of end of services here at the [facility]. If pt needs another appt to talk about end of therapy or to help find another therapist. PT has until Sep 13, 2022 as stated in the letter.

## 2022-08-31 ENCOUNTER — Telehealth: Payer: Self-pay | Admitting: Gerontology

## 2022-08-31 NOTE — Telephone Encounter (Signed)
Called pt to see if he needed any help finding a new therapist/ PCP. No answer/ left msg.

## 2022-09-22 ENCOUNTER — Telehealth: Payer: Self-pay | Admitting: Gerontology

## 2022-09-22 NOTE — Telephone Encounter (Signed)
Called pt to check in after sending end of therapy letter and making 1st follow up call and see if pt needed any extra resources or help finding a new PCP. No answer left msg.

## 2022-10-05 ENCOUNTER — Other Ambulatory Visit: Payer: Self-pay

## 2022-10-23 ENCOUNTER — Emergency Department: Payer: Medicaid Other

## 2022-10-23 ENCOUNTER — Emergency Department
Admission: EM | Admit: 2022-10-23 | Discharge: 2022-10-23 | Disposition: A | Discharge status: Home / Self Care | Payer: Medicaid Other | Attending: Emergency Medicine | Admitting: Emergency Medicine

## 2022-10-23 ENCOUNTER — Other Ambulatory Visit: Payer: Self-pay

## 2022-10-23 ENCOUNTER — Encounter: Payer: Self-pay | Admitting: Emergency Medicine

## 2022-10-23 DIAGNOSIS — S20212A Contusion of left front wall of thorax, initial encounter: Secondary | ICD-10-CM | POA: Insufficient documentation

## 2022-10-23 DIAGNOSIS — S299XXA Unspecified injury of thorax, initial encounter: Secondary | ICD-10-CM | POA: Diagnosis present

## 2022-10-23 DIAGNOSIS — I10 Essential (primary) hypertension: Secondary | ICD-10-CM | POA: Diagnosis not present

## 2022-10-23 DIAGNOSIS — J45909 Unspecified asthma, uncomplicated: Secondary | ICD-10-CM | POA: Insufficient documentation

## 2022-10-23 DIAGNOSIS — W19XXXA Unspecified fall, initial encounter: Secondary | ICD-10-CM | POA: Diagnosis not present

## 2022-10-23 MED ORDER — LIDOCAINE 5 % EX PTCH
2.0000 | MEDICATED_PATCH | CUTANEOUS | Status: DC
  Administered 2022-10-23: 2 via TRANSDERMAL
  Filled 2022-10-23: qty 2
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
## 2022-10-23 NOTE — Discharge Instructions (Signed)
Apply ice Use tylenol '1000mg'$  every 8 hours Aleve 2 pills every 12 hours Lidoderm patch if these help

## 2022-11-08 ENCOUNTER — Ambulatory Visit: Payer: Medicaid Other | Admitting: Gerontology

## 2022-11-08 NOTE — Congregational Nurse Program (Signed)
  Dept: [phone]   Congregational Nurse Program Note  Date of Encounter: 11/08/2022 Client to [facility] with letter from [employer] stating he now has full medicaid and has cards from [place] [employer]. Letter copied and extra cards placed in client's chart. Client also given a copy of his social security card at his request. Client to return to center for assistance with setting up a new PCP as he can no longer be seen at the [facility]. Ronn Melena BSN, RN Past Medical History: Past Medical History:  Diagnosis Date   Asthma    Elevated lipids 12/17/2020   Encounter to establish care 11/25/2020   GERD (gastroesophageal reflux disease)    Hypertension    Pre-diabetes    Sleep apnea     Encounter Details:  CNP Questionnaire - 11/08/22 1100       Questionnaire   Ask client: Do you give verbal consent for me to treat you today? Yes    Student Assistance N/A    Location Patient [place]    Visit Setting with Client Organization    Patient Status Unhoused    Insurance [place]   client has been approved for full medicaid   Insurance/Financial Assistance Referral N/A    Medication Have Medication Insecurities   unclear as to what medications client is currenlty prescribed   Medical Provider No   client will need a new PCP no that he has Mediciad, RN to assist   Screening Referrals Made N/A    Medical Referrals Made N/A    Medical Appointment Made N/A    Recently w/o PCP, now 1st time PCP visit completed due to CNs referral or appointment made N/A    Food Have Food Insecurities   client uses Food pantry and food stamps   Transportation Need transportation assistance   will need transportation assistance for medical appointments   Housing/Utilities No permanent housing   lives in a boarding house   Interpersonal Safety N/A    Interventions Advocate/Support;Navigate Healthcare System;Case Management    Abnormal to Normal Screening  Since Last CN Visit N/A    Screenings CN Performed N/A    Sent Client to Lab for: N/A    Did client attend any of the following based off CNs referral or appointments made? N/A    ED Visit Averted N/A    Life-Saving Intervention Made N/A

## 2022-11-23 NOTE — Congregational Nurse Program (Signed)
  Dept: [phone]   Congregational Nurse Program Note  Date of Encounter: 11/22/2022 Late Entry for 11/22/22 visit: Client to [facility] with request for assistance with obtaining a new PCP now that he has Medicaid. The [facility] has agreed to see client one more time to fill his medications, he has not had them in several months per his report. RN assisted with getting him a new patient appointment at [facility] practice. Apt is 5/23 at 10 am. Also assisted client with resetting his My Chart. Client given all information written on a card. Francesco Runner BSN, RN Past Medical History: Past Medical History:  Diagnosis Date   Asthma    Elevated lipids 12/17/2020   Encounter to establish care 11/25/2020   GERD (gastroesophageal reflux disease)    Hypertension    Pre-diabetes    Sleep apnea     Encounter Details:  CNP Questionnaire - 11/22/22 1130       Questionnaire   Ask client: Do you give verbal consent for me to treat you today? Yes    Student Assistance N/A    Location Patient Served  [facility]    Visit Setting with Client Organization    Patient Status Unhoused    Insurance Medicaid   [place] [employer]   Insurance/Financial Assistance Referral N/A    Medication Have Medication Insecurities   unclear as to what medications client is currenlty prescribed   Medical Provider No   needs new medicaid PCP   Screening Referrals Made N/A    Medical Referrals Made [facility] PCP[facility]Clinic   [facility]   Medical Appointment Made [facility] PCP/clinic   [facility] 5/23 at 10 am   Recently w/o PCP, now 1st time PCP visit completed due to CNs referral or appointment made N/A    Food Have Food Insecurities   client uses Food pantry and food stamps   Transportation Need transportation assistance   will need transportation assistance for medical appointments, has a friend that can assist if the timing works   Housing/Utilities No  permanent housing   lives in a boarding house   Interpersonal Safety N/A    Interventions Advocate/Support;Navigate Healthcare System;Case Management;Set up MyChart    Abnormal to Normal Screening Since Last CN Visit N/A    Screenings CN Performed N/A    Sent Client to Lab for: N/A    Did client attend any of the following based off CNs referral or appointments made? N/A    ED Visit Averted N/A    Life-Saving Intervention Made N/A

## 2022-11-29 ENCOUNTER — Other Ambulatory Visit: Payer: Self-pay

## 2022-11-29 ENCOUNTER — Ambulatory Visit: Payer: Medicaid Other | Admitting: Gerontology

## 2022-11-29 ENCOUNTER — Encounter: Payer: Self-pay | Admitting: Gerontology

## 2022-11-29 VITALS — BP 152/83 | HR 73 | Temp 97.8°F | Resp 16 | Ht 73.0 in | Wt 206.0 lb

## 2022-11-29 DIAGNOSIS — Z8719 Personal history of other diseases of the digestive system: Secondary | ICD-10-CM

## 2022-11-29 DIAGNOSIS — R7303 Prediabetes: Secondary | ICD-10-CM

## 2022-11-29 DIAGNOSIS — Z8709 Personal history of other diseases of the respiratory system: Secondary | ICD-10-CM

## 2022-11-29 DIAGNOSIS — I471 Supraventricular tachycardia, unspecified: Secondary | ICD-10-CM

## 2022-11-29 DIAGNOSIS — I10 Essential (primary) hypertension: Secondary | ICD-10-CM

## 2022-11-29 DIAGNOSIS — E1169 Type 2 diabetes mellitus with other specified complication: Secondary | ICD-10-CM

## 2022-11-29 MED ORDER — ROSUVASTATIN CALCIUM 10 MG PO TABS
10.0000 mg | ORAL_TABLET | Freq: Every day | ORAL | 0 refills | Status: DC
  Filled 2022-11-29: qty 30, 30d supply, fill #0

## 2022-11-29 MED ORDER — OMEPRAZOLE 20 MG PO CPDR
20.0000 mg | DELAYED_RELEASE_CAPSULE | Freq: Every day | ORAL | 0 refills | Status: DC
  Filled 2022-11-29: qty 30, 30d supply, fill #0

## 2022-11-29 MED ORDER — LOSARTAN POTASSIUM 25 MG PO TABS
25.0000 mg | ORAL_TABLET | Freq: Every day | ORAL | 0 refills | Status: DC
  Filled 2022-11-29: qty 30, 30d supply, fill #0

## 2022-11-29 MED ORDER — METOPROLOL SUCCINATE ER 100 MG PO TB24
100.0000 mg | ORAL_TABLET | Freq: Every day | ORAL | 0 refills | Status: DC
  Filled 2022-11-29: qty 30, 30d supply, fill #0

## 2022-11-29 MED ORDER — METOPROLOL TARTRATE 25 MG PO TABS
25.0000 mg | ORAL_TABLET | Freq: Every day | ORAL | 0 refills | Status: DC | PRN
  Filled 2022-11-29: qty 30, 30d supply, fill #0

## 2022-11-29 MED ORDER — ALBUTEROL SULFATE HFA 108 (90 BASE) MCG/ACT IN AERS
2.0000 | INHALATION_SPRAY | Freq: Four times a day (QID) | RESPIRATORY_TRACT | 0 refills | Status: DC | PRN
  Filled 2022-11-29: qty 6.7, 25d supply, fill #0

## 2022-11-29 NOTE — Progress Notes (Signed)
Established Patient Office Visit  Subjective   Patient ID: Jason Livingston, male    DOB: 30-Oct-1965  Age: 57 y.o. MRN: [id number]  Chief Complaint  Patient presents with   Follow-up    Last visit, patient has active health insurance. Patient has OV with new PCP at [facility] on 01/05/23.    HPI  Jason Livingston is a 57 y/o male who has a past medical history of Prediabetes, HTN, Asthma, Anxiety/Depression, GERD, and SVT who presents for medication refills. He was seen at the ED on 10/23/22 for left rib contusion after falling onto a cast iron bench. The ED did an xray that was negative for fractures and S. Fisher, PA-C prescribed him lidocaine patches, told him to take ibuprofen and tylenol as needed for pain, to use an incentive spirometer and return for worsening symptoms.  Today is his last appointment with [facility] now that patient has active medicaid and will be seeing his new PCP on 01/05/23 at [facility]. He was last seen in our office on 12/30/21. He reports that he has been out of his medications "for a couple of months" and states he has not called the pharmacy to see if he had any refills remaining. He states he takes OTC prilosec every few days for GERD, tylenol for pain PRN in the morning and at night, and nyquil as needed to help him fall sleep. He denies polyuria, polydipsia, hematuria but reports increased urinary frequency. He denies any SVT symptoms or chest pain. He reports occasional headaches, blurred vision, palpitations and shortness of breath but none today. When these events happen he reports that he acts like he is bearing down and it usually helps it go away. He reports he drinks "a beer to help with sleep" but it has been a few weeks since he had any alcohol. He has complaint of lower back pain that is a 6/10 today that is chronic. He states it feels tight and stiff. He reports years ago he had imaging done that showed bulging discs. He denies any recent  asthma attacks and states that his breathing is stable, denies wheezing, chest tightness, and cough.He states he currently smokes 1/2 pack of cigarettes a day and denies the desire to quit. Overall, he had no additional complaints and states he's doing well.  Review of Systems  Constitutional: Negative.   HENT: Negative.    Eyes: Negative.   Respiratory: Negative.    Cardiovascular: Negative.   Gastrointestinal: Negative.   Genitourinary:  Positive for frequency (reports frequent water intake and gets up 2-3 times a night to void).  Musculoskeletal:  Positive for myalgias (He went to the ED in march for left sided rib contusion. He reports occasional tylenol use in the morning and at night for pain. States it has been slowly improving).  Skin: Negative.   Neurological:  Positive for tingling (numbness and tingling feet that is almost constant. numbness and tingling hands that is intermittent and positional.).  Endo/Heme/Allergies: Negative.   Psychiatric/Behavioral: Negative.       Objective:     BP (!) 152/83 (BP Location: Right Arm, Patient Position: Sitting, Cuff Size: Large)   Pulse 73   Temp 97.8 F (36.6 C) (Oral)   Resp 16   Ht  (1.854 m)   Wt 206 lb (93.4 kg)   SpO2 94%   BMI 27.18 kg/m  BP Readings from Last 3 Encounters:  11/29/22 (!) 152/83  10/23/22 (!) 176/89  01/13/22 110/65  Wt Readings from Last 3 Encounters:  11/29/22 206 lb (93.4 kg)  10/23/22 200 lb (90.7 kg)  12/30/21 213 lb 9.6 oz (96.9 kg)    Physical Exam Vitals reviewed.  HENT:     Head: Normocephalic.     Right Ear: External ear normal.     Left Ear: External ear normal.     Nose: Nose normal.     Mouth/Throat:     Mouth: Mucous membranes are moist.     Pharynx: Oropharynx is clear.  Eyes:     Extraocular Movements: Extraocular movements intact.     Conjunctiva/sclera: Conjunctivae normal.     Pupils: Pupils are equal, round, and reactive to light.  Cardiovascular:     Rate and  Rhythm: Normal rate and regular rhythm.     Pulses: Normal pulses.     Heart sounds: Normal heart sounds.  Pulmonary:     Effort: Pulmonary effort is normal.     Breath sounds: Normal breath sounds.  Abdominal:     General: Abdomen is flat. Bowel sounds are normal.     Palpations: Abdomen is soft.  Genitourinary:    Comments: Deferred by patient  Musculoskeletal:        General: Normal range of motion.     Cervical back: Normal range of motion and neck supple. Tenderness: foot exam deferred per patient request.  Skin:    General: Skin is warm and dry.     Capillary Refill: Capillary refill takes less than 2 seconds.  Neurological:     General: No focal deficit present.     Mental Status: He is alert and oriented to person, place, and time. Mental status is at baseline.  Psychiatric:        Mood and Affect: Mood normal.        Behavior: Behavior normal.        Thought Content: Thought content normal.    Last CBC Lab Results  Component Value Date   WBC 9.4 04/25/2021   HGB 15.0 04/25/2021   HCT 42.9 04/25/2021   MCV 100.7 (H) 04/25/2021   MCH 35.2 (H) 04/25/2021   RDW 13.0 04/25/2021   PLT 306 04/25/2021   Last metabolic panel Lab Results  Component Value Date   GLUCOSE 109 (H) 12/27/2021   NA 138 12/27/2021   K 4.4 12/27/2021   CL 105 12/27/2021   CO2 25 12/27/2021   BUN 16 12/27/2021   CREATININE 0.86 12/27/2021   GFRNONAA >60 12/27/2021   CALCIUM 9.4 12/27/2021   PROT 7.2 04/25/2021   ALBUMIN 4.2 04/25/2021   LABGLOB 2.6 11/25/2020   AGRATIO 1.7 11/25/2020   BILITOT 0.5 04/25/2021   ALKPHOS 48 04/25/2021   AST 21 04/25/2021   ALT 31 04/25/2021   ANIONGAP 8 12/27/2021   Last lipids Lab Results  Component Value Date   CHOL 230 (H) 03/31/2021   HDL 47 03/31/2021   LDLCALC 137 (H) 03/31/2021   TRIG 257 (H) 03/31/2021   CHOLHDL 4.9 03/31/2021   Last hemoglobin A1c Lab Results  Component Value Date   HGBA1C 6.2 (A) 09/29/2021   The 10-year ASCVD  risk score (Arnett DK, et al., 2019) is: 33%    Assessment & Plan:   1. Essential hypertension His blood pressure in not under control at this time. Today his blood pressure was 152/83, his goal should be less than 130/80. We will give a 30 day supply of medications until he can see his new PCP with his new  active medicaid. We are unable to draw labs today. He was educated on DASH diet, drink plenty of water, and to exercise as tolerated. He was educated on signs and symptoms of heart attack/stroke and to go to the ED. - losartan (COZAAR) 25 MG tablet; Take 1 tablet (25 mg total) by mouth once daily.  Dispense: 30 tablet; Refill: 0 - metoprolol succinate (TOPROL XL) 100 MG 24 hr tablet; Take 1 tablet (100 mg total) by mouth once daily.  Dispense: 30 tablet; Refill: 0  2. Pre-diabetes His last HbgA1c was 6.2% on 09/29/2021. We are unable to draw labs today due to new active medicaid. He was educated on eating a diet that consists of low concentrated sweets, drink plenty of water and exercise as tolerated. He was encouraged to examine his feet daily.  3. History of gastroesophageal reflux (GERD) His acid reflux is not controlled at this time. He was encouraged to avoid spicy, fried, and fatty foods. We will give 30 day supply of medications until he can see his new PCP with his medicaid.  - omeprazole (PRILOSEC) 20 MG capsule; Take 1 capsule (20 mg total) by mouth once daily.  Dispense: 30 capsule; Refill: 0  4. Hyperlipidemia associated with type 2 diabetes mellitus He will continue on current medication, unable to draw labs today due to active medicaid. He was encouraged to continue on low fat/cholesterol diet and exercise as tolerated. - rosuvastatin (CRESTOR) 10 MG tablet; Take 1 tablet (10 mg total) by mouth once daily.  Dispense: 30 tablet; Refill: 0  5. History of asthma  - He breathing is stable, he will continue on current medication as needed, and was encouraged on smoking cessation. -  albuterol (VENTOLIN HFA) 108 (90 Base) MCG/ACT inhaler; Inhale 2 puffs into the lungs every 6 (six) hours as needed for wheezing or shortness of breath.  Dispense: 6.7 g; Refill: 0  6. SVT (supraventricular tachycardia) His heart rate today was 73 bpm, regular rate and rhythm. He will continue on current medication as needed. He was advised to go to the ED for worsening symptoms. - metoprolol tartrate (LOPRESSOR) 25 MG tablet; Take 1 tablet (25 mg total) by mouth once daily as needed. (For elevated heart rates or SVT).  Dispense: 30 tablet; Refill: 0  Follow up PRN at this time, as today was his last appointment because he has active Medicaid. [facility] wishes him well with his care.  Myra Gianotti, RN

## 2022-11-29 NOTE — Patient Instructions (Signed)

## 2022-12-06 NOTE — Congregational Nurse Program (Signed)
  Dept: [phone]   Congregational Nurse Program Note  Date of Encounter: 12/06/2022 Client to [facility] day center requesting information regarding his upcoming new patient apt at [facility]. Address given. Client plans to set up his Medicaid transportation, will request assistance if needed. Apt is 5/23 at 10 am. He plans to still use the [facility] pharmacy for his medications. Jason Livingston BSN, RN Past Medical History: Past Medical History:  Diagnosis Date   Asthma    Elevated lipids 12/17/2020   Encounter to establish care 11/25/2020   GERD (gastroesophageal reflux disease)    Hypertension    Pre-diabetes    Sleep apnea     Encounter Details:  CNP Questionnaire - 12/06/22 1251       Questionnaire   Ask client: Do you give verbal consent for me to treat you today? Yes    Student Assistance N/A    Location Patient Served  [facility]    Visit Setting with Client Organization    Patient Status Unhoused    Insurance Medicaid   [place] Complete health   Insurance/Financial Assistance Referral N/A    Medication Have Medication Insecurities   unclear as to what medications client is currenlty prescribed   Medical Provider No   needs new medicaid PCP   Screening Referrals Made N/A    Medical Referrals Made [facility]   [facility]   Medical Appointment Made [facility] PCP/clinic   [facility] 5/23 at 10 am   Recently w/o PCP, now 1st time PCP visit completed due to CNs referral or appointment made N/A    Food Have Food Insecurities   client uses Food pantry and food stamps   Transportation Need transportation assistance   Has Medicaid transportation   Housing/Utilities No permanent housing   lives in a boarding house   Interpersonal Safety N/A    Interventions Advocate/Support;Navigate Healthcare System    Abnormal to Normal Screening Since Last CN Visit N/A    Screenings CN Performed N/A    Sent Client to Lab  for: N/A    Did client attend any of the following based off CNs referral or appointments made? N/A    ED Visit Averted N/A    Life-Saving Intervention Made N/A

## 2022-12-28 ENCOUNTER — Other Ambulatory Visit: Payer: Self-pay

## 2023-01-05 ENCOUNTER — Encounter: Payer: Self-pay | Admitting: Physician Assistant

## 2023-01-05 ENCOUNTER — Telehealth: Payer: Self-pay | Admitting: Physician Assistant

## 2023-01-05 ENCOUNTER — Ambulatory Visit (INDEPENDENT_AMBULATORY_CARE_PROVIDER_SITE_OTHER): Payer: Medicaid Other | Admitting: Physician Assistant

## 2023-01-05 ENCOUNTER — Other Ambulatory Visit: Payer: Self-pay

## 2023-01-05 VITALS — BP 157/93 | HR 88 | Temp 98.3°F | Resp 14 | Ht 73.0 in | Wt 207.9 lb

## 2023-01-05 DIAGNOSIS — R7303 Prediabetes: Secondary | ICD-10-CM

## 2023-01-05 DIAGNOSIS — E782 Mixed hyperlipidemia: Secondary | ICD-10-CM

## 2023-01-05 DIAGNOSIS — J4540 Moderate persistent asthma, uncomplicated: Secondary | ICD-10-CM

## 2023-01-05 DIAGNOSIS — I471 Supraventricular tachycardia, unspecified: Secondary | ICD-10-CM

## 2023-01-05 DIAGNOSIS — Z125 Encounter for screening for malignant neoplasm of prostate: Secondary | ICD-10-CM

## 2023-01-05 DIAGNOSIS — Z789 Other specified health status: Secondary | ICD-10-CM

## 2023-01-05 DIAGNOSIS — Z720 Tobacco use: Secondary | ICD-10-CM

## 2023-01-05 DIAGNOSIS — I739 Peripheral vascular disease, unspecified: Secondary | ICD-10-CM

## 2023-01-05 DIAGNOSIS — F1091 Alcohol use, unspecified, in remission: Secondary | ICD-10-CM | POA: Insufficient documentation

## 2023-01-05 DIAGNOSIS — Z87898 Personal history of other specified conditions: Secondary | ICD-10-CM

## 2023-01-05 DIAGNOSIS — I10 Essential (primary) hypertension: Secondary | ICD-10-CM | POA: Diagnosis not present

## 2023-01-05 DIAGNOSIS — F1090 Alcohol use, unspecified, uncomplicated: Secondary | ICD-10-CM

## 2023-01-05 DIAGNOSIS — Z8719 Personal history of other diseases of the digestive system: Secondary | ICD-10-CM

## 2023-01-05 MED ORDER — METOPROLOL SUCCINATE ER 100 MG PO TB24
100.0000 mg | ORAL_TABLET | Freq: Every day | ORAL | 1 refills | Status: DC
  Filled 2023-01-05: qty 90, 90d supply, fill #0
  Filled 2023-05-10: qty 30, 30d supply, fill #1
  Filled 2023-06-05: qty 30, 30d supply, fill #2
  Filled 2023-09-12: qty 21, 21d supply, fill #3
  Filled 2023-09-12: qty 9, 9d supply, fill #3

## 2023-01-05 MED ORDER — OMEPRAZOLE 20 MG PO CPDR
20.0000 mg | DELAYED_RELEASE_CAPSULE | Freq: Every day | ORAL | 1 refills | Status: DC
  Filled 2023-01-05: qty 30, 30d supply, fill #0
  Filled 2023-01-05: qty 60, 60d supply, fill #0
  Filled 2023-05-10: qty 30, 30d supply, fill #1
  Filled 2023-06-05: qty 30, 30d supply, fill #2
  Filled 2023-09-12: qty 30, 30d supply, fill #3

## 2023-01-05 MED ORDER — FLUTICASONE FUROATE-VILANTEROL 200-25 MCG/ACT IN AEPB
1.0000 | INHALATION_SPRAY | Freq: Every day | RESPIRATORY_TRACT | 3 refills | Status: DC
  Filled 2023-01-05 (×2): qty 60, 60d supply, fill #0
  Filled 2023-05-10: qty 60, 30d supply, fill #0
  Filled 2023-06-05: qty 60, 60d supply, fill #0

## 2023-01-05 MED ORDER — LOSARTAN POTASSIUM 50 MG PO TABS
50.0000 mg | ORAL_TABLET | Freq: Every day | ORAL | 1 refills | Status: DC
  Filled 2023-01-05: qty 90, 90d supply, fill #0

## 2023-01-05 MED ORDER — VITAMIN B-1 100 MG PO TABS
100.0000 mg | ORAL_TABLET | Freq: Every day | ORAL | 3 refills | Status: DC
  Filled 2023-01-05: qty 90, 90d supply, fill #0
  Filled 2023-05-10: qty 100, 100d supply, fill #1
  Filled 2023-06-05: qty 90, 90d supply, fill #1
  Filled 2023-09-12: qty 90, 90d supply, fill #2

## 2023-01-05 MED ORDER — ALBUTEROL SULFATE HFA 108 (90 BASE) MCG/ACT IN AERS
2.0000 | INHALATION_SPRAY | Freq: Four times a day (QID) | RESPIRATORY_TRACT | 0 refills | Status: DC | PRN
  Filled 2023-01-05: qty 18, 25d supply, fill #0

## 2023-01-05 NOTE — Assessment & Plan Note (Signed)
Historically, on metformin Repeat a1c

## 2023-01-05 NOTE — Progress Notes (Signed)
I,Jason  Livingston,acting as a Neurosurgeon for [employer], PA-C.,have documented all relevant documentation on the behalf of Jason Ferguson, PA-C,as directed by  Jason Ferguson, PA-C while in the presence of Jason Ferguson, PA-C.   New patient visit   Patient: Jason Livingston   DOB: 10/28/1965   57 y.o. Male  MRN: [id number] Visit Date: 01/05/2023  Today's healthcare provider: [name], PA-C  Cc. New patient  Subjective    Jason Livingston is a 57 y.o. male who presents today as a new patient to establish care.  HPI  Patient is transferring care from [facility].  Reports history of SVT, infrequent symptoms but has metop 25 in case.  Reports h/o HTN no recent control.   He smokes, since 20, ~ 0.5 packs day. Considering quitting. Reports daily alcohol use, 2 beers nightly to sleep. Reports long term history of sleep issues, several medication trials w/o success. Only thing that helps him sleep is alcohol. He reports he used to drink  heavier, h/o of unsuccessful AA meetings.  Reports leg cramping when walking, if walking uphill feet go numb.   Past Medical History:  Diagnosis Date   Arthritis    Asthma    Elevated lipids 12/17/2020   Encounter to establish care 11/25/2020   GERD (gastroesophageal reflux disease)    Hypertension    Pre-diabetes    Sleep apnea    Past Surgical History:  Procedure Laterality Date   COLONOSCOPY N/A 03/09/2021   Procedure: COLONOSCOPY;  Surgeon: Wyline Mood, MD;  Location: [facility] ENDOSCOPY;  Service: Gastroenterology;  Laterality: N/A;   MANDIBLE FRACTURE SURGERY     Family Status  Relation Name Status   Mother  Alive   Father Unknown Other   Oceanographer  (Not Specified)   MGM  Deceased   MGF  Deceased   Cousin  Alive       kidney transplant   Family History  Problem Relation Age of Onset   Other Mother        unknown medical history   Other Father        unknwon medical history   Hypertension Maternal Grandmother    Heart attack  Maternal Grandfather 94   Heart disease Maternal Grandfather    Other Cousin        kidney transplant   Social History   Socioeconomic History   Marital status: Single    Spouse name: Not on file   Number of children: Not on file   Years of education: Not on file   Highest education level: Not on file  Occupational History   Not on file  Tobacco Use   Smoking status: Every Day    Packs/day: 0.50    Years: 37.00    Additional pack years: 0.00    Total pack years: 18.50    Types: Cigarettes   Smokeless tobacco: Former    Quit date: 2012  Vaping Use   Vaping Use: Never used  Substance and Sexual Activity   Alcohol use: Yes    Alcohol/week: 14.0 standard drinks of alcohol    Types: 14 Cans of beer per week    Comment: last use end of March 2024 "1 beer per day"; hx of 2 40oz beers at night to sleep, 02/18/21 20oz with dinner everynight   Drug use: Not Currently    Types: Cocaine, Marijuana, LSD    Comment: used in his 20's, has not used in 30 years   Sexual activity: Not Currently  Other Topics Concern   Not on file  Social History Narrative   Not on file   Social Determinants of Health   Financial Resource Strain: High Risk (05/20/2021)   Overall Financial Resource Strain (CARDIA)    Difficulty of Paying Living Expenses: Hard  Food Insecurity: Food Insecurity Present (05/20/2021)   Hunger Livingston Sign    Worried About Running Out of Food in the Last Year: Sometimes true    Ran Out of Food in the Last Year: Sometimes true  Transportation Needs: Unmet Transportation Needs (05/20/2021)   PRAPARE - Administrator, Civil Service (Medical): No    Lack of Transportation (Non-Medical): Yes  Physical Activity: Inactive (05/20/2021)   Exercise Livingston Sign    Days of Exercise per Week: 0 days    Minutes of Exercise per Session: 0 min  Stress: Stress Concern Present (05/20/2021)   [employer] of Occupational Health - Occupational Stress Questionnaire    Feeling of  Stress : To some extent  Social Connections: Moderately Isolated (05/20/2021)   Social Connection and Isolation Panel [NHANES]    Frequency of Communication with Friends and Family: Once a week    Frequency of Social Gatherings with Friends and Family: Once a week    Attends Religious Services: More than 4 times per year    Active Member of [employer] or Organizations: Yes    Attends Engineer, structural: More than 4 times per year    Marital Status: Never married   Outpatient Medications Prior to Visit  Medication Sig   aspirin (ASPIRIN ADULT LOW STRENGTH) 81 MG EC tablet Take 1 tablet (81 mg total) by mouth once daily. Swallow whole.   diclofenac Sodium (VOLTAREN) 1 % GEL Apply 2 g topically 4 (four) times daily.   ibuprofen (ADVIL) 200 MG tablet Take 800 mg by mouth as needed for headache.   metFORMIN (GLUCOPHAGE-XR) 500 MG 24 hr tablet Take 2 tablets (1,000 mg total) by mouth once daily with breakfast.   metoprolol tartrate (LOPRESSOR) 25 MG tablet Take 1 tablet (25 mg total) by mouth once daily as needed. (For elevated heart rates or SVT).   rosuvastatin (CRESTOR) 10 MG tablet Take 1 tablet (10 mg total) by mouth once daily.   [DISCONTINUED] albuterol (VENTOLIN HFA) 108 (90 Base) MCG/ACT inhaler Inhale 2 puffs into the lungs every 6 (six) hours as needed for wheezing or shortness of breath.   [DISCONTINUED] losartan (COZAAR) 25 MG tablet Take 1 tablet (25 mg total) by mouth once daily.   [DISCONTINUED] metoprolol succinate (TOPROL XL) 100 MG 24 hr tablet Take 1 tablet (100 mg total) by mouth once daily.   [DISCONTINUED] omeprazole (PRILOSEC) 20 MG capsule Take 1 capsule (20 mg total) by mouth once daily.   [DISCONTINUED] thiamine (VITAMIN B-1) 100 MG tablet Take 1 tablet (100 mg total) by mouth once daily.   [DISCONTINUED] B Complex-C-Folic Acid (B COMPLEX-VITAMIN C-FOLIC ACID) 1 MG tablet Take 1 tablet by mouth once daily with breakfast. (Patient not taking: Reported on 11/29/2022)    [DISCONTINUED] fluticasone furoate-vilanterol (BREO ELLIPTA) 200-25 MCG/ACT AEPB Inhale 1 puff into the lungs once daily. (Patient not taking: Reported on 11/29/2022)   No facility-administered medications prior to visit.   No Known Allergies   There is no immunization history on file for this patient.  Health Maintenance  Topic Date Due   COVID-19 Vaccine (1) Never done   HIV Screening  Never done   Diabetic kidney evaluation - Urine ACR  Never done   Hepatitis C Screening  Never done   DTaP/Tdap/Td (1 - Tdap) Never done   Zoster Vaccines- Shingrix (1 of 2) Never done   Diabetic kidney evaluation - eGFR measurement  12/28/2022   INFLUENZA VACCINE  03/16/2023   COLONOSCOPY (Pts 45-35yrs Insurance coverage will need to be confirmed)  03/09/2028   HPV VACCINES  Aged Out   Lung Cancer Screening  Discontinued    Patient Care Team: Jason Ferguson, PA-C as PCP - General (Physician Assistant)  Review of Systems  Constitutional:  Positive for fatigue.  HENT:  Positive for dental problem.   Eyes:  Positive for photophobia.  Respiratory:  Positive for shortness of breath.   Musculoskeletal:  Positive for arthralgias, back pain and myalgias.  Allergic/Immunologic: Positive for environmental allergies.  Neurological:  Positive for light-headedness and numbness.     Objective    BP (!) 157/93 (BP Location: Left Arm, Patient Position: Sitting, Cuff Size: Normal)   Pulse 88   Temp 98.3 F (36.8 C) (Oral)   Resp 14   Ht 6\' 1"  (1.854 m)   Wt 207 lb 14.4 oz (94.3 kg)   SpO2 97%   BMI 27.43 kg/m    Physical Exam Constitutional:      General: He is awake.     Appearance: He is well-developed.  HENT:     Head: Normocephalic.  Eyes:     Conjunctiva/sclera: Conjunctivae normal.  Cardiovascular:     Rate and Rhythm: Normal rate and regular rhythm.     Heart sounds: Normal heart sounds.  Pulmonary:     Effort: Pulmonary effort is normal.     Breath sounds: Normal breath sounds.   Skin:    General: Skin is warm.  Neurological:     Mental Status: He is alert and oriented to person, place, and time.  Psychiatric:        Attention and Perception: Attention normal.        Mood and Affect: Mood normal.        Speech: Speech normal.        Behavior: Behavior is cooperative.     Depression Screen    12/07/2021    3:23 PM 11/18/2021    4:10 PM 11/03/2021    3:21 PM 10/26/2021    3:28 PM  PHQ 2/9 Scores  PHQ - 2 Score 0 0 3 3  PHQ- 9 Score 6 6 10 10    No results found for any visits on 01/05/23.  Assessment & Plan      Problem List Items Addressed This Visit       Cardiovascular and Mediastinum   Essential hypertension - Primary    Uncontrolled Manages with losartan 25 mg (metop 100) Advised increasing to losartan 50 mg  F/u 1 mo Ordered cmp      Relevant Medications   losartan (COZAAR) 50 MG tablet   metoprolol succinate (TOPROL XL) 100 MG 24 hr tablet   Other Relevant Orders   CBC w/Diff/Platelet   Comprehensive Metabolic Panel (CMET)   SVT (supraventricular tachycardia)    Controlled w/ metop 100 mg daily and 25 mg PRN      Relevant Medications   losartan (COZAAR) 50 MG tablet   metoprolol succinate (TOPROL XL) 100 MG 24 hr tablet     Respiratory   Asthma, moderate persistent    Pt reports daily breo and prn albuterol      Relevant Medications   fluticasone furoate-vilanterol (BREO ELLIPTA) 200-25 MCG/ACT AEPB  albuterol (VENTOLIN HFA) 108 (90 Base) MCG/ACT inhaler     Other   History of insomnia    Pt self manages now w/ etoh See etoh use I see h/o of trazodone use      History of gastroesophageal reflux (GERD)    Controlled w/ prilosec      Relevant Medications   omeprazole (PRILOSEC) 20 MG capsule   Pre-diabetes    Historically, on metformin Repeat a1c      Relevant Orders   HgB A1c   Hyperlipidemia    Managed on crestor 10  Repeat lipids today and change dose as indicated Takes asa 81 mg daily      Relevant  Medications   losartan (COZAAR) 50 MG tablet   metoprolol succinate (TOPROL XL) 100 MG 24 hr tablet   Other Relevant Orders   Lipid Profile   Tobacco abuse    At least a 15-20 pack year history pt is contemplative Discussed and ordered lung cancer screening He takes ASA daily      Relevant Orders   Ambulatory Referral Lung Cancer Screening [facility]   Ambulatory referral to Vascular Surgery   Claudication [facility])    Symptomatic Referring to vascular      Relevant Orders   Ambulatory referral to Vascular Surgery   Alcohol use    2 beers nightly Pt aware he needs to cut down No interest in support groups currently      Relevant Orders   Comprehensive Metabolic Panel (CMET)   Vitamin B1   Vitamin B12   Folate   Other Visit Diagnoses     Prostate cancer screening       Relevant Orders   PSA       Return in about 4 weeks (around 02/02/2023) for hypertension.    I, Jason Ferguson, PA-C have reviewed all documentation for this visit. The documentation on  01/05/23   for the exam, diagnosis, procedures, and orders are all accurate and complete.  Jason Ferguson, PA-C [facility] [address] [place], [place], [address] Office: [phone] Fax: [phone]   [facility]

## 2023-01-05 NOTE — Assessment & Plan Note (Signed)
Pt self manages now w/ etoh See etoh use I see h/o of trazodone use

## 2023-01-05 NOTE — Assessment & Plan Note (Signed)
2 beers nightly Pt aware he needs to cut down No interest in support groups currently

## 2023-01-05 NOTE — Assessment & Plan Note (Signed)
Uncontrolled Manages with losartan 25 mg (metop 100) Advised increasing to losartan 50 mg  F/u 1 mo Ordered cmp

## 2023-01-05 NOTE — Assessment & Plan Note (Signed)
Symptomatic Referring to vascular

## 2023-01-05 NOTE — Assessment & Plan Note (Signed)
Managed on crestor 10  Repeat lipids today and change dose as indicated Takes asa 81 mg daily

## 2023-01-05 NOTE — Assessment & Plan Note (Signed)
Controlled w/ metop 100 mg daily and 25 mg PRN

## 2023-01-05 NOTE — Assessment & Plan Note (Signed)
>>  ASSESSMENT AND PLAN FOR TOBACCO ABUSE WRITTEN ON 01/05/2023 11:25 AM BY DRUBEL, LINDSAY, PA-C  At least a 15-20 pack year history pt is contemplative Discussed and ordered lung cancer screening He takes ASA daily

## 2023-01-05 NOTE — Assessment & Plan Note (Addendum)
At least a 15-20 pack year history pt is contemplative Discussed and ordered lung cancer screening He takes ASA daily

## 2023-01-05 NOTE — Telephone Encounter (Signed)
[facility] is requesting prior authorization Key: [id number] Name: Jason Livingston [id number] MCG/ACT Aersol Powder

## 2023-01-05 NOTE — Assessment & Plan Note (Signed)
Controlled w/ prilosec

## 2023-01-05 NOTE — Assessment & Plan Note (Signed)
Pt reports daily breo and prn albuterol

## 2023-01-06 ENCOUNTER — Telehealth: Payer: Self-pay | Admitting: Physician Assistant

## 2023-01-06 NOTE — Telephone Encounter (Signed)
error 

## 2023-01-10 ENCOUNTER — Other Ambulatory Visit: Payer: Self-pay

## 2023-01-10 ENCOUNTER — Other Ambulatory Visit: Payer: Self-pay | Admitting: Physician Assistant

## 2023-01-10 DIAGNOSIS — E782 Mixed hyperlipidemia: Secondary | ICD-10-CM

## 2023-01-10 MED ORDER — ROSUVASTATIN CALCIUM 20 MG PO TABS
20.0000 mg | ORAL_TABLET | Freq: Every day | ORAL | 3 refills | Status: DC
  Filled 2023-01-10: qty 90, 90d supply, fill #0
  Filled 2023-05-10: qty 30, 30d supply, fill #1

## 2023-01-10 NOTE — Congregational Nurse Program (Signed)
  Dept: [phone]   Congregational Nurse Program Note  Date of Encounter: 01/10/2023 Client to [facility] day center to talk with RN about his new patient appointment at [facility] practice on 5/23. Medications reviewed. Client was restarted on all medications he was previously on except metformin. Labs reviewed with client as well, A1c was 6.3, cholesterol remains elevated. Crestor was increased to 20 mg from 10 mg. RN encouraged client to contact his provider regarding his metformin if he had concerns about it not being restarted.Discussed, PA note in his Mychart. RN provided some diet education regarding her recommendations. Client is homeless and does have a difficult time storing foods, he is hopeful he will have housing soon. Support given. Francesco Runner BSN, RN Past Medical History: Past Medical History:  Diagnosis Date   Arthritis    Asthma    Elevated lipids 12/17/2020   Encounter to establish care 11/25/2020   GERD (gastroesophageal reflux disease)    Hypertension    Pre-diabetes    Sleep apnea     Encounter Details:  CNP Questionnaire - 01/10/23 1200       Questionnaire   Ask client: Do you give verbal consent for me to treat you today? Yes    Student Assistance N/A    Location Patient Served  [facility]    Visit Setting with Client Organization    Patient Status Unhoused    Insurance Medicaid   [place] Complete health   Insurance/Financial Assistance Referral N/A    Medication Have Medication Insecurities   cleint was able to obtain medications precribed  from [facility]   Medical Provider No   needs new medicaid PCP   Screening Referrals Made N/A    Medical Referrals Made N/A    Medical Appointment Made N/A    Recently w/o PCP, now 1st time PCP visit completed due to CNs referral or appointment made Yes   [place] [facility], client is now established   Food Have Food Insecurities   client uses Food pantry and food stamps    Transportation Need transportation assistance   Has Medicaid transportation and a friend that provides transporation   Housing/Utilities No permanent housing   lives in a boarding house   Interpersonal Safety N/A    Interventions Advocate/Support;Navigate Healthcare System;Reviewed Medications    Abnormal to Normal Screening Since Last CN Visit N/A    Screenings CN Performed N/A    Sent Client to Lab for: N/A    Did client attend any of the following based off CNs referral or appointments made? Yes;Medical   client did attend his new patient apt on 5/23 with [facility] practice   ED Visit Averted N/A    Life-Saving Intervention Made N/A

## 2023-01-11 LAB — CBC WITH DIFFERENTIAL/PLATELET
Basophils Absolute: 0.1 10*3/uL (ref 0.0–0.2)
Basos: 1 %
EOS (ABSOLUTE): 0.1 10*3/uL (ref 0.0–0.4)
Eos: 2 %
Hematocrit: 41.7 % (ref 37.5–51.0)
Hemoglobin: 14.4 g/dL (ref 13.0–17.7)
Immature Grans (Abs): 0 10*3/uL (ref 0.0–0.1)
Immature Granulocytes: 0 %
Lymphocytes Absolute: 1.7 10*3/uL (ref 0.7–3.1)
Lymphs: 28 %
MCH: 33.8 pg — ABNORMAL HIGH (ref 26.6–33.0)
MCHC: 34.5 g/dL (ref 31.5–35.7)
MCV: 98 fL — ABNORMAL HIGH (ref 79–97)
Monocytes Absolute: 0.6 10*3/uL (ref 0.1–0.9)
Monocytes: 9 %
Neutrophils Absolute: 3.8 10*3/uL (ref 1.4–7.0)
Neutrophils: 60 %
Platelets: 309 10*3/uL (ref 150–450)
RBC: 4.26 x10E6/uL (ref 4.14–5.80)
RDW: 13.1 % (ref 11.6–15.4)
WBC: 6.2 10*3/uL (ref 3.4–10.8)

## 2023-01-11 LAB — VITAMIN B1: Thiamine: 144.2 nmol/L (ref 66.5–200.0)

## 2023-01-11 LAB — COMPREHENSIVE METABOLIC PANEL
ALT: 19 IU/L (ref 0–44)
AST: 22 IU/L (ref 0–40)
Albumin/Globulin Ratio: 1.9 (ref 1.2–2.2)
Albumin: 4.6 g/dL (ref 3.8–4.9)
Alkaline Phosphatase: 80 IU/L (ref 44–121)
BUN/Creatinine Ratio: 14 (ref 9–20)
BUN: 12 mg/dL (ref 6–24)
Bilirubin Total: 0.4 mg/dL (ref 0.0–1.2)
CO2: 23 mmol/L (ref 20–29)
Calcium: 9.4 mg/dL (ref 8.7–10.2)
Chloride: 104 mmol/L (ref 96–106)
Creatinine, Ser: 0.88 mg/dL (ref 0.76–1.27)
Globulin, Total: 2.4 g/dL (ref 1.5–4.5)
Glucose: 88 mg/dL (ref 70–99)
Potassium: 4.8 mmol/L (ref 3.5–5.2)
Sodium: 141 mmol/L (ref 134–144)
Total Protein: 7 g/dL (ref 6.0–8.5)
eGFR: 102 mL/min/{1.73_m2} (ref >59)

## 2023-01-11 LAB — HEMOGLOBIN A1C
Est. average glucose Bld gHb Est-mCnc: 134 mg/dL
Hgb A1c MFr Bld: 6.3 % — ABNORMAL HIGH (ref 4.8–5.6)

## 2023-01-11 LAB — PSA: Prostate Specific Ag, Serum: 0.6 ng/mL (ref 0.0–4.0)

## 2023-01-11 LAB — VITAMIN B12: Vitamin B-12: 222 pg/mL — ABNORMAL LOW (ref 232–1245)

## 2023-01-11 LAB — LIPID PANEL
Chol/HDL Ratio: 4.5 ratio (ref 0.0–5.0)
Cholesterol, Total: 201 mg/dL — ABNORMAL HIGH (ref 100–199)
HDL: 45 mg/dL (ref >39)
LDL Chol Calc (NIH): 108 mg/dL — ABNORMAL HIGH (ref 0–99)
Triglycerides: 277 mg/dL — ABNORMAL HIGH (ref 0–149)
VLDL Cholesterol Cal: 48 mg/dL — ABNORMAL HIGH (ref 5–40)

## 2023-01-11 LAB — FOLATE: Folate: 8.9 ng/mL (ref >3.0)

## 2023-01-12 NOTE — Telephone Encounter (Signed)
Breo Ellipta 200-25MCG/ACT aerosol powder Status: PA RequestCreated: May 23rd, 2024 [id number] Sent: May 30th, 2024

## 2023-01-17 NOTE — Telephone Encounter (Signed)
Approved  12/29/2022 to 01/12/2024

## 2023-01-20 ENCOUNTER — Other Ambulatory Visit: Payer: Self-pay

## 2023-02-01 IMAGING — CT CT HEART MORP W/ CTA COR W/ SCORE W/ CA W/CM &/OR W/O CM
1 of 14 series · 3 of 20 positions shown, 4 images · non-contrast
Comparison: None.

Addendum:
CLINICAL DATA: Chest pain

EXAM:
Cardiac/Coronary  CTA
TECHNIQUE: The patient was scanned on a Siemens Somatom go.Top scanner.

[Series 4: multiphase % cta coronary 0.60 · axial · 0.37mm/px · z∈[-1159,-1095]mm · 3 of 3564 slices shown, 4 images]
[im 891/3564  vessel]
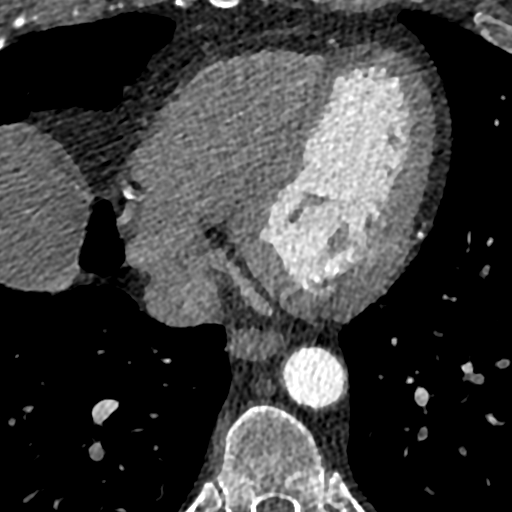
[im 891/3564  lung]
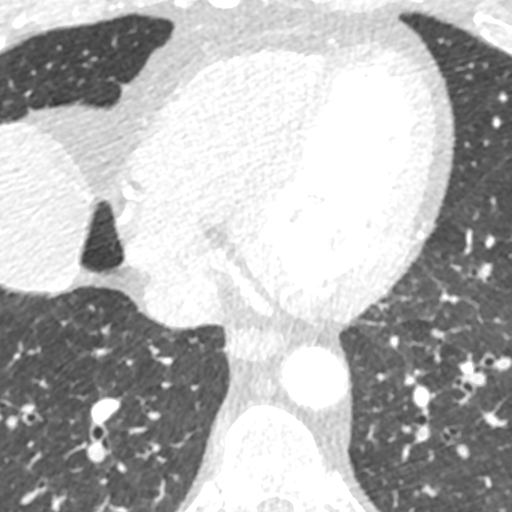
[im 1782/3564  vessel]
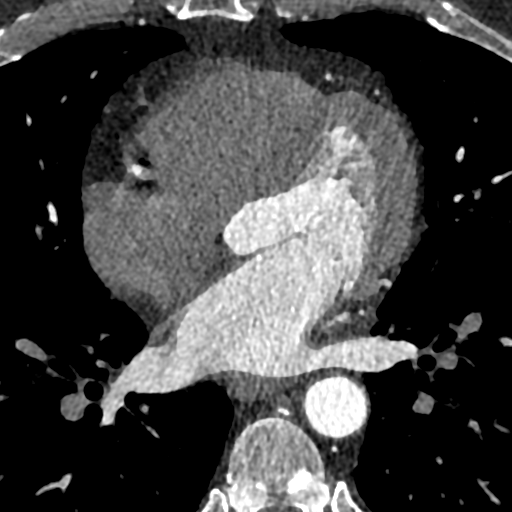
[im 2673/3564  vessel]
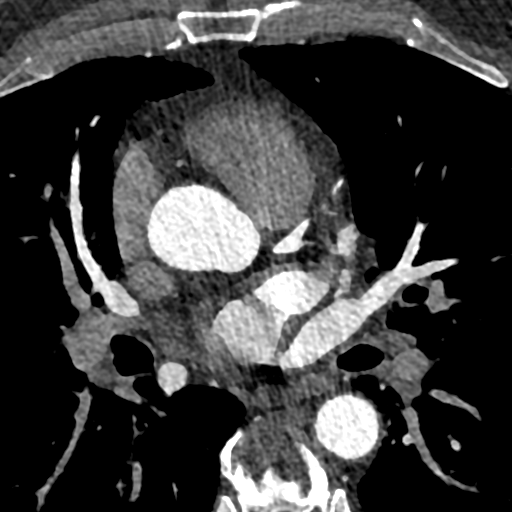

[3 of 20 positions shown; findings below may reference images not displayed]



Aortic Valve:  Trileaflet.  No calcifications.

Coronary Arteries:  Normal coronary origin.  Right dominance.

RCA is a dominant artery that gives rise to PDA and PLA. There is
calcified plaque in the ostial and mid RCA causing minimal stenosis
(<25%).

Left main is a large artery that gives rise to LAD and LCX arteries.
There is no LM disease.

LAD has calcified plaque in the proximal to mid segment causing mild
stenosis (25%).

LCX is a non-dominant artery that gives rise to one large OM1
branch. There is no plaque.

Other findings:

Normal pulmonary vein drainage into the left atrium.

Normal left atrial appendage without a thrombus.

Normal size of the pulmonary artery.
IMPRESSION: 1. Coronary calcium score of 159. This was 87th percentile for age
and sex matched control.

2. Normal coronary origin with right dominance.

3. Mild proximal LAD stenosis (25%).

4. Minimal proximal RCA stenosis (<25%).

5. CAD-RADS 2. Mild non-obstructive CAD (25-49%). Consider
non-atherosclerotic causes of chest pain. Consider preventive
therapy and risk factor modification.

EXAM:
OVER-READ INTERPRETATION  CT CHEST

The following report is an over-read performed by radiologist Dr.
over-read does not include interpretation of cardiac or coronary
anatomy or pathology. The Cardiac/Coronary CTA interpretation by the
cardiologist is attached.
FINDINGS: Vascular: Normal heart size. No pericardial effusion. Normal caliber
thoracic aorta with mild atherosclerotic disease no suspicious
filling defects of the central pulmonary arteries.

Mediastinum/Nodes: Esophagus is unremarkable. No pathologically
enlarged lymph nodes seen in the chest.

Lungs/Pleura: Central airways are patent. Mild paraseptal emphysema.
Right basilar atelectasis. No consolidation, pleural effusion or
pneumothorax.

Upper Abdomen: No acute abnormality.

Musculoskeletal: No chest wall mass or suspicious bone lesions
identified.
IMPRESSION: No acute extracardiac abnormalities.



Aortic Valve:  Trileaflet.  No calcifications.

Coronary Arteries:  Normal coronary origin.  Right dominance.

RCA is a dominant artery that gives rise to PDA and PLA. There is
calcified plaque in the ostial and mid RCA causing minimal stenosis
(<25%).

Left main is a large artery that gives rise to LAD and LCX arteries.
There is no LM disease.

LAD has calcified plaque in the proximal to mid segment causing mild
stenosis (25%).

LCX is a non-dominant artery that gives rise to one large OM1
branch. There is no plaque.

Other findings:

Normal pulmonary vein drainage into the left atrium.

Normal left atrial appendage without a thrombus.

Normal size of the pulmonary artery.
IMPRESSION: 1. Coronary calcium score of 159. This was 87th percentile for age
and sex matched control.

2. Normal coronary origin with right dominance.

3. Mild proximal LAD stenosis (25%).

4. Minimal proximal RCA stenosis (<25%).

5. CAD-RADS 2. Mild non-obstructive CAD (25-49%). Consider
non-atherosclerotic causes of chest pain. Consider preventive
therapy and risk factor modification.

## 2023-02-09 ENCOUNTER — Other Ambulatory Visit: Payer: Self-pay

## 2023-02-09 ENCOUNTER — Ambulatory Visit (INDEPENDENT_AMBULATORY_CARE_PROVIDER_SITE_OTHER): Payer: Medicaid Other | Admitting: Physician Assistant

## 2023-02-09 ENCOUNTER — Encounter: Payer: Self-pay | Admitting: Physician Assistant

## 2023-02-09 VITALS — BP 149/89 | HR 73 | Temp 97.7°F | Resp 13 | Ht 73.0 in | Wt 212.5 lb

## 2023-02-09 DIAGNOSIS — R7303 Prediabetes: Secondary | ICD-10-CM | POA: Diagnosis not present

## 2023-02-09 DIAGNOSIS — I10 Essential (primary) hypertension: Secondary | ICD-10-CM | POA: Diagnosis not present

## 2023-02-09 DIAGNOSIS — M79602 Pain in left arm: Secondary | ICD-10-CM

## 2023-02-09 DIAGNOSIS — I471 Supraventricular tachycardia, unspecified: Secondary | ICD-10-CM

## 2023-02-09 MED ORDER — METFORMIN HCL ER 500 MG PO TB24
1000.0000 mg | ORAL_TABLET | Freq: Every day | ORAL | 2 refills | Status: DC
  Filled 2023-02-09 – 2023-04-04 (×2): qty 180, 90d supply, fill #0
  Filled 2023-05-09: qty 60, 30d supply, fill #0
  Filled 2023-06-05: qty 60, 30d supply, fill #1
  Filled 2023-07-31: qty 60, 30d supply, fill #2
  Filled 2023-09-12: qty 60, 30d supply, fill #3
  Filled 2023-10-03: qty 60, 30d supply, fill #4

## 2023-02-09 MED ORDER — METOPROLOL TARTRATE 25 MG PO TABS
25.0000 mg | ORAL_TABLET | Freq: Every day | ORAL | 1 refills | Status: AC | PRN
Start: 2023-02-09 — End: ?
  Filled 2023-02-09 – 2023-06-05 (×2): qty 30, 30d supply, fill #0

## 2023-02-09 MED ORDER — LOSARTAN POTASSIUM 100 MG PO TABS
100.0000 mg | ORAL_TABLET | Freq: Every day | ORAL | 1 refills | Status: DC
  Filled 2023-02-09: qty 90, 90d supply, fill #0
  Filled 2023-05-10: qty 30, 30d supply, fill #0
  Filled 2023-06-05: qty 30, 30d supply, fill #1
  Filled 2023-07-31: qty 30, 30d supply, fill #2
  Filled 2023-09-12: qty 30, 30d supply, fill #3

## 2023-02-09 MED ORDER — LIDOCAINE 4 % EX PTCH
1.0000 | MEDICATED_PATCH | CUTANEOUS | 1 refills | Status: DC
  Filled 2023-02-09: qty 30, 30d supply, fill #0

## 2023-02-09 NOTE — Assessment & Plan Note (Addendum)
Some change w/ losartan 50 mg, increase to 100 mg  already managed on metop 100 mg daily w/ 25 mg prn svt F/u 1 mo w/ new provider

## 2023-02-09 NOTE — Progress Notes (Signed)
I,Vanessa  Vital,acting as a Neurosurgeon for [employer], PA-C.,have documented all relevant documentation on the behalf of [name], PA-C,as directed by  [name], PA-C while in the presence of [name], PA-C.  Established patient visit   Patient: Jason Livingston   DOB: [birth year]   57 y.o. Male  MRN: [id number] Visit Date: 02/09/2023  Today's healthcare provider: [name], PA-C   Cc. HTN f/u  Subjective    HPI  Patient has not specific questions or concerns but does needs refills on his lopressor and metformin.  He reports left shoulder pain that radiates down his arm. He typically sleeps on his left side with his arm above his head. Pain is worse after sleeping in this position.   Hypertension, follow-up  BP Readings from Last 3 Encounters:  02/09/23 (!) 149/89  01/05/23 (!) 157/93  11/29/22 (!) 152/83   Wt Readings from Last 3 Encounters:  02/09/23 212 lb 8 oz (96.4 kg)  01/05/23 207 lb 14.4 oz (94.3 kg)  11/29/22 206 lb (93.4 kg)     He was last seen for hypertension 1 months ago.  BP at that visit was 157/93. Management since that visit includes losartan 25 mg.  He reports good compliance with treatment. He is not having side effects. He is following a Regular diet. He is not exercising. He does smoke.   Outside blood pressures are not being checked Symptoms: No chest pain No chest pressure  No palpitations No syncope  No dyspnea No orthopnea  No paroxysmal nocturnal dyspnea No lower extremity edema   Pertinent labs Lab Results  Component Value Date   CHOL 201 (H) 01/05/2023   HDL 45 01/05/2023   LDLCALC 108 (H) 01/05/2023   TRIG 277 (H) 01/05/2023   CHOLHDL 4.5 01/05/2023   Lab Results  Component Value Date   NA 141 01/05/2023   K 4.8 01/05/2023   CREATININE 0.88 01/05/2023   EGFR 102 01/05/2023   GLUCOSE 88 01/05/2023   TSH 2.340 04/25/2021     The 10-year ASCVD risk score (Arnett DK, et al., 2019) is:  30.3%  ---------------------------------------------------------------------------------------------------   Medications: Outpatient Medications Prior to Visit  Medication Sig   albuterol (VENTOLIN HFA) 108 (90 Base) MCG/ACT inhaler Inhale 2 puffs into the lungs every 6 (six) hours as needed for wheezing or shortness of breath.   aspirin (ASPIRIN ADULT LOW STRENGTH) 81 MG EC tablet Take 1 tablet (81 mg total) by mouth once daily. Swallow whole.   diclofenac Sodium (VOLTAREN) 1 % GEL Apply 2 g topically 4 (four) times daily.   fluticasone furoate-vilanterol (BREO ELLIPTA) 200-25 MCG/ACT AEPB Inhale 1 puff into the lungs once daily.   ibuprofen (ADVIL) 200 MG tablet Take 800 mg by mouth as needed for headache.   metoprolol succinate (TOPROL XL) 100 MG 24 hr tablet Take 1 tablet (100 mg total) by mouth once daily.   omeprazole (PRILOSEC) 20 MG capsule Take 1 capsule (20 mg total) by mouth once daily.   rosuvastatin (CRESTOR) 20 MG tablet Take 1 tablet (20 mg total) by mouth daily.   thiamine (VITAMIN B-1) 100 MG tablet Take 1 tablet (100 mg total) by mouth once daily.   [DISCONTINUED] losartan (COZAAR) 50 MG tablet Take 1 tablet (50 mg total) by mouth daily.   [DISCONTINUED] metFORMIN (GLUCOPHAGE-XR) 500 MG 24 hr tablet Take 2 tablets (1,000 mg total) by mouth once daily with breakfast.   [DISCONTINUED] metoprolol tartrate (LOPRESSOR) 25 MG tablet Take 1 tablet (25 mg  total) by mouth once daily as needed. (For elevated heart rates or SVT).   No facility-administered medications prior to visit.    Review of Systems  All other systems reviewed and are negative.    Objective    BP (!) 149/89 (BP Location: Right Arm, Patient Position: Sitting, Cuff Size: Normal)   Pulse 73   Temp 97.7 F (36.5 C) (Oral)   Resp 13   Ht 6\' 1"  (1.854 m)   Wt 212 lb 8 oz (96.4 kg)   SpO2 97%   BMI 28.04 kg/m   Physical Exam Vitals reviewed.  Constitutional:      Appearance: He is not ill-appearing.   HENT:     Head: Normocephalic.  Eyes:     Conjunctiva/sclera: Conjunctivae normal.  Cardiovascular:     Rate and Rhythm: Normal rate.  Pulmonary:     Effort: Pulmonary effort is normal. No respiratory distress.  Neurological:     General: No focal deficit present.     Mental Status: He is alert and oriented to person, place, and time.  Psychiatric:        Mood and Affect: Mood normal.        Behavior: Behavior normal.      No results found for any visits on 02/09/23.  Assessment & Plan     Problem List Items Addressed This Visit       Cardiovascular and Mediastinum   Essential hypertension - Primary    Some change w/ losartan 50 mg, increase to 100 mg  already managed on metop 100 mg daily w/ 25 mg prn svt F/u 1 mo w/ new provider       Relevant Medications   metoprolol tartrate (LOPRESSOR) 25 MG tablet   losartan (COZAAR) 100 MG tablet   SVT (supraventricular tachycardia)    Pulled for refill of metop      Relevant Medications   metoprolol tartrate (LOPRESSOR) 25 MG tablet   losartan (COZAAR) 100 MG tablet     Other   Pre-diabetes    Pulled for refill of metformin      Relevant Medications   metFORMIN (GLUCOPHAGE-XR) 500 MG 24 hr tablet   Other Visit Diagnoses     Left arm pain       Relevant Medications   lidocaine 4 %      Left arm pain -recommending changing position while sleeping. Can try lidocaine patch for pain relief. If not covered rx, avail over the counter.    Return in about 4 weeks (around 03/09/2023) for hypertension.      I, [name], PA-C have reviewed all documentation for this visit. The documentation on  02/14/23   for the exam, diagnosis, procedures, and orders are all accurate and complete.  [name], PA-C [facility] [address] [place], [place], [address] Office: [phone] Fax: [phone]   [facility]

## 2023-02-09 NOTE — Patient Instructions (Addendum)
Vascular Referral for your legs: -[facility] - [phone]  Lung Cancer Screening: -[facility] [phone]

## 2023-02-14 NOTE — Assessment & Plan Note (Signed)
Pulled for refill of metformin

## 2023-02-14 NOTE — Assessment & Plan Note (Signed)
Pulled for refill of metop

## 2023-02-21 ENCOUNTER — Other Ambulatory Visit: Payer: Self-pay

## 2023-02-22 ENCOUNTER — Other Ambulatory Visit: Payer: Self-pay

## 2023-02-22 DIAGNOSIS — Z87891 Personal history of nicotine dependence: Secondary | ICD-10-CM

## 2023-02-22 DIAGNOSIS — F17210 Nicotine dependence, cigarettes, uncomplicated: Secondary | ICD-10-CM

## 2023-03-27 ENCOUNTER — Other Ambulatory Visit: Payer: Self-pay

## 2023-03-27 ENCOUNTER — Ambulatory Visit (INDEPENDENT_AMBULATORY_CARE_PROVIDER_SITE_OTHER): Payer: Medicaid Other | Admitting: Family Medicine

## 2023-03-27 VITALS — BP 125/80 | HR 70 | Temp 97.5°F | Ht 73.0 in | Wt 210.0 lb

## 2023-03-27 DIAGNOSIS — E538 Deficiency of other specified B group vitamins: Secondary | ICD-10-CM

## 2023-03-27 DIAGNOSIS — E119 Type 2 diabetes mellitus without complications: Secondary | ICD-10-CM | POA: Diagnosis not present

## 2023-03-27 DIAGNOSIS — I10 Essential (primary) hypertension: Secondary | ICD-10-CM | POA: Diagnosis not present

## 2023-03-27 DIAGNOSIS — I739 Peripheral vascular disease, unspecified: Secondary | ICD-10-CM

## 2023-03-27 DIAGNOSIS — E782 Mixed hyperlipidemia: Secondary | ICD-10-CM | POA: Diagnosis not present

## 2023-03-27 DIAGNOSIS — F17200 Nicotine dependence, unspecified, uncomplicated: Secondary | ICD-10-CM

## 2023-03-27 DIAGNOSIS — Z114 Encounter for screening for human immunodeficiency virus [HIV]: Secondary | ICD-10-CM

## 2023-03-27 DIAGNOSIS — Z1159 Encounter for screening for other viral diseases: Secondary | ICD-10-CM

## 2023-03-27 DIAGNOSIS — E1169 Type 2 diabetes mellitus with other specified complication: Secondary | ICD-10-CM | POA: Insufficient documentation

## 2023-03-27 HISTORY — DX: Type 2 diabetes mellitus without complications: E11.9

## 2023-03-27 MED ORDER — VITAMIN B-12 1000 MCG PO TABS
1000.0000 ug | ORAL_TABLET | Freq: Every day | ORAL | 1 refills | Status: DC
Start: 2023-03-27 — End: 2024-06-06
  Filled 2023-03-27: qty 90, 90d supply, fill #0
  Filled 2023-05-10: qty 30, 30d supply, fill #0
  Filled 2023-05-10 – 2023-09-12 (×2): qty 90, 90d supply, fill #0
  Filled 2024-02-06: qty 90, 90d supply, fill #1

## 2023-03-27 NOTE — Assessment & Plan Note (Signed)
Strongly recommended cessation of smoking, as this has a high probability of improving the patient's intermittent claudication.

## 2023-03-27 NOTE — Assessment & Plan Note (Addendum)
Patient was increased from rosuvastatin 10 mg to rosuvastatin 20 mg back in Jan 10, 2023.  He is not yet due for a recheck at this point.  Will continue rosuvastatin 20 mg nightly.

## 2023-03-27 NOTE — Patient Instructions (Addendum)
I recommend you check with your insurance regarding coverage of the following vaccines, which, based on our records, you are due for: Tdap (tetanus, diphtheria and pertussis) Shingrix (shingles) Influenza COVID-19   Please start vitamin B12 supplement 1000 mcg daily (available at any pharmacy   For your leg pain: Contact [facility] [address], [place], [place] [address] [phone]   Prioritize these meds: Metoprolol succinate Losartan  Rosuvastatin Aspirin Metformin ER

## 2023-03-27 NOTE — Progress Notes (Unsigned)
Established patient visit   Patient: Jason Livingston   DOB: [birth year]   57 y.o. Male  MRN: [id number] Visit Date: 03/27/2023  Today's healthcare provider: Sherlyn Hay, DO   Chief Complaint  Patient presents with  . Hypertension  . Diabetes    Patient was told he was pre diabetic/ borderline diabetic.  [name] Kitchen Alcohol Intoxication    On June 26 he stopped drinking.     Subjective    HPI HPI     Diabetes    Additional comments: Patient was told he was pre diabetic/ borderline diabetic.        Alcohol Intoxication    Additional comments: On June 26 he stopped drinking.        Last edited by Adline Peals, CMA on 03/27/2023  3:54 PM.      Hypertension, follow-up  BP Readings from Last 3 Encounters:  03/27/23 125/80  02/09/23 (!) 149/89  01/05/23 (!) 157/93   Wt Readings from Last 3 Encounters:  03/27/23 210 lb (95.3 kg)  02/09/23 212 lb 8 oz (96.4 kg)  01/05/23 207 lb 14.4 oz (94.3 kg)     He was last seen for hypertension 6 weeks ago.  BP at that visit was 149/89. Management since that visit includes ***.  He reports excellent compliance with treatment. He is not having side effects.  He is following a Regular, low cost  diet. He is not exercising. He does smoke.  Wants to wait until he has been sober for a full year before tackling his nicotine dependence.  Use of agents associated with hypertension:  nicotine .   Outside blood pressures are not checked.  Symptoms: Yes chest pain No chest pressure  No palpitations No syncope  No dyspnea Yes orthopnea  No paroxysmal nocturnal dyspnea No lower extremity edema   Had chest pain last night; feels his having a lot of air trapped. Has been occurring since he quit drinking, lasts about half hour and then goes away.  Pertinent labs Lab Results  Component Value Date   CHOL 201 (H) 01/05/2023   HDL 45 01/05/2023   LDLCALC 108 (H) 01/05/2023   TRIG 277 (H) 01/05/2023   CHOLHDL 4.5 01/05/2023   Lab  Results  Component Value Date   NA 141 01/05/2023   K 4.8 01/05/2023   CREATININE 0.88 01/05/2023   EGFR 102 01/05/2023   GLUCOSE 88 01/05/2023   TSH 2.340 04/25/2021     The 10-year ASCVD risk score (Arnett DK, et al., 2019) is: 23.2%  ---------------------------------------------------------------------------------------------------  Intermittent claudication:  Patient presents for presents evaluation of right and left calf claudication that becomes symptomatic at 1-2 blocks. It has been present for the past 1 years.  Rest pain is present. Ulceration is not present. Trental has not been tried.  Cilostazol has not been tried.  - Has followed up with vascular? No  - starts in calves then progresses to his hamstrings; worse on left side Feet go completely numb  - this occurs more rapidly when carrying heavy weight , such as from grocery store -  intense pain in arch when he first gets up in the morning for several minutes, htne improves  Low vitamin B12: Taking B12 supplement?  No; forgot about it   Health Maintenance: HIV/Hep C screening?   Vaccines:    Type 2 diabetes: Discuss diabetes - hemoglobin A1c of 6.3 while on metformin XR 1000 mg daily Would like nutritionist referral? ***  Used  to donate plasma 3-4 years ago and was given multiple tetanus vaccines he believes, though he did not convert.  {History (Optional):23778}  Medications: Outpatient Medications Prior to Visit  Medication Sig  . albuterol (VENTOLIN HFA) 108 (90 Base) MCG/ACT inhaler Inhale 2 puffs into the lungs every 6 (six) hours as needed for wheezing or shortness of breath.  [name] Kitchen aspirin (ASPIRIN ADULT LOW STRENGTH) 81 MG EC tablet Take 1 tablet (81 mg total) by mouth once daily. Swallow whole.  . diclofenac Sodium (VOLTAREN) 1 % GEL Apply 2 g topically 4 (four) times daily.  . fluticasone furoate-vilanterol (BREO ELLIPTA) 200-25 MCG/ACT AEPB Inhale 1 puff into the lungs once daily.  [name] Kitchen ibuprofen  (ADVIL) 200 MG tablet Take 800 mg by mouth as needed for headache.  . losartan (COZAAR) 100 MG tablet Take 1 tablet (100 mg total) by mouth daily.  . metFORMIN (GLUCOPHAGE-XR) 500 MG 24 hr tablet Take 2 tablets (1,000 mg total) by mouth once daily with breakfast.  . metoprolol succinate (TOPROL XL) 100 MG 24 hr tablet Take 1 tablet (100 mg total) by mouth once daily.  . metoprolol tartrate (LOPRESSOR) 25 MG tablet Take 1 tablet (25 mg total) by mouth once daily as needed. (For elevated heart rates or SVT).  [name] Kitchen omeprazole (PRILOSEC) 20 MG capsule Take 1 capsule (20 mg total) by mouth once daily.  . rosuvastatin (CRESTOR) 20 MG tablet Take 1 tablet (20 mg total) by mouth daily.  [name] Kitchen thiamine (VITAMIN B-1) 100 MG tablet Take 1 tablet (100 mg total) by mouth once daily.  . [DISCONTINUED] lidocaine 4 % Place 1 patch onto the skin daily.  . [DISCONTINUED] traZODone (DESYREL) 100 MG tablet TAKE (1/2) TO (1) TABLET  BY MOUTH ONCE DAILY AT BEDTIME AS NEEDED. (Patient not taking: Reported on 01/21/2021)   No facility-administered medications prior to visit.    Review of Systems  Respiratory: Negative.  Negative for cough, shortness of breath and wheezing.   Cardiovascular:  Negative for chest pain, palpitations and leg swelling.  Neurological:  Negative for weakness and headaches.     {Insert previous labs (optional):23779} {See past labs  Heme  Chem  Endocrine  Serology  Results Review (optional):1}   Objective    BP 125/80 (BP Location: Left Arm, Patient Position: Sitting, Cuff Size: Normal)   Pulse 70   Temp (!) 97.5 F (36.4 C) (Oral)   Ht 6\' 1"  (1.854 m)   Wt 210 lb (95.3 kg)   SpO2 97%   BMI 27.71 kg/m  {Insert last BP/Wt (optional):23777}{See vitals history (optional):1}   Physical Exam Vitals and nursing note reviewed.  Constitutional:      General: He is not in acute distress.    Appearance: Normal appearance.  HENT:     Head: Normocephalic and atraumatic.  Eyes:      General: No scleral icterus.    Conjunctiva/sclera: Conjunctivae normal.  Cardiovascular:     Rate and Rhythm: Normal rate.  Pulmonary:     Effort: Pulmonary effort is normal.  Neurological:     Mental Status: He is alert and oriented to person, place, and time. Mental status is at baseline.  Psychiatric:        Mood and Affect: Mood normal.        Behavior: Behavior normal.     No results found for any visits on 03/27/23.  Assessment & Plan    Mixed hyperlipidemia Assessment & Plan: Patient was increased from rosuvastatin 10 mg to rosuvastatin 20 mg  back in May 2023.  He is not yet due for a recheck at this point.  Will continue rosuvastatin 20 mg nightly.   Essential hypertension  Type 2 diabetes mellitus without complication, without long-term current use of insulin (HCC)  Low serum vitamin B12 Assessment & Plan: B12 level was 222 to 01/05/2023; he was recommended to take 1000 mcg of vitamin B12 daily. If he has not been taking supplementary vitamin B12, will recommend he do so today.   Nicotine dependence with current use Assessment & Plan: Strongly recommended cessation of smoking, as this has a high probability of improving the patient's intermittent claudication.   Claudication [facility])   46-month follow-up?  Needs annual exam  ***  No follow-ups on file.      I discussed the assessment and treatment plan with the patient  The patient was provided an opportunity to ask questions and all were answered. The patient agreed with the plan and demonstrated an understanding of the instructions.   The patient was advised to call back or seek an in-person evaluation if the symptoms worsen or if the condition fails to improve as anticipated.    Sherlyn Hay, DO  [facility] [phone] (phone) [phone] (fax)  [facility]

## 2023-03-27 NOTE — Assessment & Plan Note (Addendum)
B12 level was 222 to 01/05/2023; he was recommended to take 1000 mcg of vitamin B12 daily. He has not been taking supplementary vitamin B12, recommended he start today and sent in prescription for it. He may get it over-the-counter if it is better for him financially.

## 2023-03-28 NOTE — Assessment & Plan Note (Signed)
Patient's blood pressure is doing well today.  Patient endorses compliance with his losartan and metoprolol XL.  Will continue him on his current medications.

## 2023-03-28 NOTE — Assessment & Plan Note (Signed)
Gave patient information for vascular clinic so he may contact them.  Patient is unwilling to quit smoking at this time (which would likely help substantially with his claudication), as he just quit using alcohol a couple months ago.

## 2023-03-28 NOTE — Assessment & Plan Note (Signed)
Discussed with patient that, given the dose of metformin he is on and his A1c of 6.3, he does technically meet criteria for being diabetic.  Encouraged him to refill and restart his metformin as soon as he is able and to reduce carbohydrates in his diet.  Will revisit this at next appointment.

## 2023-04-04 ENCOUNTER — Other Ambulatory Visit: Payer: Self-pay

## 2023-04-04 NOTE — Congregational Nurse Program (Signed)
  Dept: [phone]   Congregational Nurse Program Note  Date of Encounter: 04/04/2023 Client to [facility] day center with request for review of medications, interpreting a piece of mail and to share that he has an upcoming screening chest CT scheduled through his PCP. Call placed to [facility] pharmacy, client does have refills that can be picked up today. He was concerned about the metformin, which is one that will be ready. Client reported that he has a difficult time remembering to take his medications. RN suggested putting an alarm/reminder in his phone. Client to use his Medicaid transportation for CT apt next week. Direct transportation umber given to client. Francesco Runner BSN, RN Past Medical History: Past Medical History:  Diagnosis Date   Arthritis    Asthma    Elevated lipids 12/17/2020   Encounter to establish care 11/25/2020   GERD (gastroesophageal reflux disease)    Hypertension    Pre-diabetes    Sleep apnea    Type 2 diabetes mellitus without complication, without long-term current use of insulin (HCC) 03/27/2023    Encounter Details:  CNP Questionnaire - 04/04/23 1030       Questionnaire   Ask client: Do you give verbal consent for me to treat you today? Yes    Student Assistance N/A    Location Patient Served  [facility]    Visit Setting with Client Organization    Patient Status Unknown   client does ave housing through a Holiday representative program   Insurance Medicaid   [place] Complete health   Insurance/Financial Assistance Referral N/A    Medication N/A   client is able to obtain medications precribed  from [facility] community [place]   Medical Provider Yes   [facility], Jacquenette Shone MD   Screening Referrals Made N/A   Client has a chest CT scheduled for 8/30 through his PCP   Medical Referrals Made N/A    Medical Appointment Made N/A    Recently w/o PCP, now 1st time PCP visit completed due to CNs referral or appointment  made N/A   [facility], client is now established   Food N/A   client uses Scientist, research (life sciences) N/A   Has Medicaid transportation and a friend that provides transporation   Housing/Utilities N/A   client now has housing   Economist N/A    Interventions Advocate/Support;Navigate Healthcare System;Reviewed Medications    Abnormal to Normal Screening Since Last CN Visit N/A    Screenings CN Performed N/A    Sent Client to Lab for: N/A    Did client attend any of the following based off CNs referral or appointments made? N/A   client did attend his new patient apt on 5/23 with [facility] practice   ED Visit Averted N/A    Life-Saving Intervention Made N/A

## 2023-04-14 ENCOUNTER — Ambulatory Visit: Payer: Medicaid Other

## 2023-04-21 ENCOUNTER — Ambulatory Visit: Admission: RE | Admit: 2023-04-21 | Payer: Medicaid Other | Source: Ambulatory Visit

## 2023-04-21 ENCOUNTER — Other Ambulatory Visit: Payer: Self-pay

## 2023-05-09 ENCOUNTER — Ambulatory Visit
Admission: RE | Admit: 2023-05-09 | Discharge: 2023-05-09 | Disposition: A | Discharge status: Home / Self Care | Payer: Medicaid Other | Source: Ambulatory Visit | Attending: Acute Care | Admitting: Acute Care

## 2023-05-09 ENCOUNTER — Other Ambulatory Visit: Payer: Self-pay

## 2023-05-09 DIAGNOSIS — Z87891 Personal history of nicotine dependence: Secondary | ICD-10-CM | POA: Insufficient documentation

## 2023-05-09 DIAGNOSIS — F17210 Nicotine dependence, cigarettes, uncomplicated: Secondary | ICD-10-CM | POA: Insufficient documentation

## 2023-05-09 NOTE — Congregational Nurse Program (Signed)
  Dept: [phone]   Congregational Nurse Program Note  Date of Encounter: 05/09/2023 Client to [facility] with several questions regarding mail he had received. He was unclear as to why he was receiving letters and calls from [employer]. RN explained that this was insurance through the [employer]. He does not recall enrolling and now has Medicaid.  He was able to cancel his plan. Rn also assisted client with trying to find a dentist as he believes he has an infected tooth. RN contacted Medicaid direct to obtain information on dental providers in this area. Called 3 no available new patient apts until Feb 2025. Client given additional numbers to call as he needed to leave for an apt. Francesco Runner BSN, RN Past Medical History: Past Medical History:  Diagnosis Date   Arthritis    Asthma    Elevated lipids 12/17/2020   Encounter to establish care 11/25/2020   GERD (gastroesophageal reflux disease)    Hypertension    Pre-diabetes    Sleep apnea    Type 2 diabetes mellitus without complication, without long-term current use of insulin (HCC) 03/27/2023    Encounter Details:  CNP Questionnaire - 05/09/23 1030       Questionnaire   Ask client: Do you give verbal consent for me to treat you today? Yes    Student Assistance N/A    Location Patient Served  [facility]    Visit Setting with Client Organization    Patient Status Unknown   client does ave housing through a Holiday representative program   Insurance Medicaid   [place] Complete [place]   Insurance/Financial Assistance Referral N/A    Medication N/A   client is able to obtain medications precribed  from [facility] community [place]   Medical Provider Yes   [facility], Jacquenette Shone MD   Screening Referrals Made N/A   Client has a chest CT scheduled at 2:30 today 9/24   Medical Referrals Made N/A    Medical Appointment Made N/A    Recently w/o PCP, now 1st time PCP visit completed due to CNs referral or  appointment made N/A   [facility], client is now established   Food N/A   client uses Food pantry and food stamps   Transportation N/A   Has Medicaid transportation and a friend that provides transporation. RN explained again how to use the [place]icaid trnasportation and offered assistance as needed   Housing/Utilities N/A   client now has housing   Interpersonal Safety N/A    Interventions Advocate/Support;Navigate Healthcare System;Case Management   assisted client with some [employer] insurance questions and concerns   Abnormal to Normal Screening Since Last CN Visit N/A    Screenings CN Performed N/A    Sent Client to Lab for: N/A    Did client attend any of the following based off CNs referral or appointments made? N/A   client did attend his new patient apt on 5/23 with [facility] practice   ED Visit Averted N/A    Life-Saving Intervention Made N/A

## 2023-05-10 ENCOUNTER — Other Ambulatory Visit: Payer: Self-pay

## 2023-05-10 ENCOUNTER — Encounter: Payer: Self-pay | Admitting: Family Medicine

## 2023-05-10 ENCOUNTER — Ambulatory Visit (INDEPENDENT_AMBULATORY_CARE_PROVIDER_SITE_OTHER): Payer: Medicaid Other | Admitting: Family Medicine

## 2023-05-10 VITALS — BP 127/80 | HR 82 | Temp 97.5°F | Ht 73.0 in | Wt 213.9 lb

## 2023-05-10 DIAGNOSIS — I10 Essential (primary) hypertension: Secondary | ICD-10-CM | POA: Diagnosis not present

## 2023-05-10 DIAGNOSIS — E119 Type 2 diabetes mellitus without complications: Secondary | ICD-10-CM | POA: Diagnosis not present

## 2023-05-10 DIAGNOSIS — K047 Periapical abscess without sinus: Secondary | ICD-10-CM

## 2023-05-10 DIAGNOSIS — Z7984 Long term (current) use of oral hypoglycemic drugs: Secondary | ICD-10-CM

## 2023-05-10 DIAGNOSIS — F17200 Nicotine dependence, unspecified, uncomplicated: Secondary | ICD-10-CM

## 2023-05-10 MED ORDER — AMOXICILLIN-POT CLAVULANATE 875-125 MG PO TABS
1.0000 | ORAL_TABLET | Freq: Two times a day (BID) | ORAL | 0 refills | Status: DC
Start: 2023-05-10 — End: 2023-06-05
  Filled 2023-05-10: qty 14, 7d supply, fill #0

## 2023-05-10 MED ORDER — CHLORHEXIDINE GLUCONATE 0.12 % MT SOLN
15.0000 mL | Freq: Two times a day (BID) | OROMUCOSAL | 2 refills | Status: DC
  Filled 2023-05-10: qty 473, 16d supply, fill #0
  Filled 2023-06-26 (×2): qty 473, 16d supply, fill #1
  Filled 2024-05-08: qty 473, 16d supply, fill #2

## 2023-05-10 MED ORDER — ACCU-CHEK SOFTCLIX LANCETS MISC
1.0000 | Freq: Every day | 3 refills | Status: AC
Start: 2023-05-10 — End: ?
  Filled 2023-05-10: qty 100, 30d supply, fill #0
  Filled 2023-06-05: qty 100, 30d supply, fill #1
  Filled 2023-09-12: qty 100, 100d supply, fill #1
  Filled 2024-01-31 – 2024-02-06 (×2): qty 100, 100d supply, fill #2
  Filled 2024-05-08: qty 100, 100d supply, fill #3

## 2023-05-10 MED ORDER — LANCET DEVICE MISC
1.0000 | Freq: Every day | 0 refills | Status: AC
  Filled 2023-05-10: qty 1, 30d supply, fill #0

## 2023-05-10 MED ORDER — BLOOD GLUCOSE TEST VI STRP
1.0000 | ORAL_STRIP | Freq: Every day | 3 refills | Status: DC
Start: 2023-05-10 — End: 2023-11-10
  Filled 2023-05-10: qty 50, 30d supply, fill #0
  Filled 2023-06-26: qty 50, 30d supply, fill #1
  Filled 2023-06-26: qty 100, 100d supply, fill #1
  Filled 2023-09-12: qty 50, 50d supply, fill #2
  Filled 2023-11-10: qty 50, 50d supply, fill #3

## 2023-05-10 MED ORDER — BLOOD GLUCOSE MONITOR SYSTEM W/DEVICE KIT
1.0000 | PACK | Freq: Every day | 0 refills | Status: AC
Start: 2023-05-10 — End: ?
  Filled 2023-05-10 (×2): qty 1, 30d supply, fill #0

## 2023-05-10 NOTE — Patient Instructions (Addendum)
[  ]    For your leg pain:       Contact [facility]       [address], [place], [place] [address]       [phone]   [   ] Schedule a diabetic eye exam   Prioritize these meds: Metoprolol succinate Losartan  Rosuvastatin Aspirin Metformin ER

## 2023-05-10 NOTE — Progress Notes (Signed)
Established patient visit   Patient: Jason Livingston   DOB: 03/09/1966   57 y.o. Male  MRN: [id number] Visit Date: 05/10/2023  Today's healthcare provider: Sherlyn Hay, DO   Chief Complaint  Patient presents with   Follow-up   Diabetes   Subjective    HPI Diabetes Mellitus Type II, Follow-up  Lab Results  Component Value Date   HGBA1C 6.5 (H) 05/10/2023   HGBA1C 6.3 (H) 01/05/2023   HGBA1C 6.2 (A) 09/29/2021   Wt Readings from Last 3 Encounters:  05/10/23 213 lb 14.4 oz (97 kg)  03/27/23 210 lb (95.3 kg)  02/09/23 212 lb 8 oz (96.4 kg)   Last seen for diabetes 6 weeks ago.  Management since then includes No change; metformin had previously been prescribed. He reports poor compliance with treatment. He just picked the metformin and started it last night. He is not having side effects. He had no problem when he was on it previously.  Symptoms: Yes fatigue No foot ulcerations  Yes appetite changes No nausea  Yes paresthesia of the feet  Yes polydipsia  Yes polyuria No visual disturbances   No vomiting     Home blood sugar records:  does not have glucometer  Episodes of hypoglycemia? No    Current insulin regiment: N/A Most Recent Eye Exam: overdue Current exercise: limited due to leg pain Current diet habits: has been trying to reduce sugar but has also been drinking diluted koolaid in place of alcohol.  Pertinent Labs: Lab Results  Component Value Date   CHOL 207 (H) 05/10/2023   HDL 34 (L) 05/10/2023   LDLCALC 123 (H) 05/10/2023   TRIG 280 (H) 05/10/2023   CHOLHDL 6.1 (H) 05/10/2023   Lab Results  Component Value Date   NA 139 05/10/2023   K 4.6 05/10/2023   CREATININE 0.87 05/10/2023   EGFR 102 05/10/2023     ---------------------------------------------------------------------------------------------------    Medications: Outpatient Medications Prior to Visit  Medication Sig   albuterol (VENTOLIN HFA) 108 (90 Base) MCG/ACT inhaler  Inhale 2 puffs into the lungs every 6 (six) hours as needed for wheezing or shortness of breath.   aspirin (ASPIRIN ADULT LOW STRENGTH) 81 MG EC tablet Take 1 tablet (81 mg total) by mouth once daily. Swallow whole.   cyanocobalamin (VITAMIN B12) 1000 MCG tablet Take 1 tablet (1,000 mcg total) by mouth daily.   diclofenac Sodium (VOLTAREN) 1 % GEL Apply 2 g topically 4 (four) times daily.   fluticasone furoate-vilanterol (BREO ELLIPTA) 200-25 MCG/ACT AEPB Inhale 1 puff into the lungs once daily.   ibuprofen (ADVIL) 200 MG tablet Take 800 mg by mouth as needed for headache.   losartan (COZAAR) 100 MG tablet Take 1 tablet (100 mg total) by mouth daily.   metFORMIN (GLUCOPHAGE-XR) 500 MG 24 hr tablet Take 2 tablets (1,000 mg total) by mouth once daily with breakfast.   metoprolol succinate (TOPROL XL) 100 MG 24 hr tablet Take 1 tablet (100 mg total) by mouth once daily.   metoprolol tartrate (LOPRESSOR) 25 MG tablet Take 1 tablet (25 mg total) by mouth once daily as needed. (For elevated heart rates or SVT).   omeprazole (PRILOSEC) 20 MG capsule Take 1 capsule (20 mg total) by mouth once daily.   rosuvastatin (CRESTOR) 20 MG tablet Take 1 tablet (20 mg total) by mouth daily.   thiamine (VITAMIN B-1) 100 MG tablet Take 1 tablet (100 mg total) by mouth once daily.   No facility-administered medications  prior to visit.    Review of Systems  Respiratory:  Positive for shortness of breath (intermittent, infrequent). Negative for cough and wheezing.   Cardiovascular:  Negative for chest pain, palpitations and leg swelling.  Endocrine: Negative for polydipsia and polyphagia.  Neurological:  Negative for weakness and headaches.        Objective    BP 127/80   Pulse 82   Temp (!) 97.5 F (36.4 C)   Ht 6\' 1"  (1.854 m)   Wt 213 lb 14.4 oz (97 kg)   SpO2 95%   BMI 28.22 kg/m     Physical Exam Vitals and nursing note reviewed.  Constitutional:      General: He is not in acute distress.     Appearance: Normal appearance.  HENT:     Head: Normocephalic and atraumatic.     Mouth/Throat:     Mouth: Mucous membranes are moist.     Dentition: Abnormal dentition. Dental tenderness, gingival swelling and dental caries present.     Pharynx: No pharyngeal swelling, oropharyngeal exudate, posterior oropharyngeal erythema or uvula swelling.     Tonsils: No tonsillar exudate.      Comments: Lower right 4th tooth from back largely absent with small portion remaining and small amount of 1st tooth at posterior lower right also has a chip.  Eyes:     General: No scleral icterus.    Conjunctiva/sclera: Conjunctivae normal.  Cardiovascular:     Rate and Rhythm: Normal rate.  Pulmonary:     Effort: Pulmonary effort is normal.  Neurological:     Mental Status: He is alert and oriented to person, place, and time. Mental status is at baseline.  Psychiatric:        Mood and Affect: Mood normal.        Behavior: Behavior normal.      No results found for any visits on 05/10/23.  Assessment & Plan    Type 2 diabetes mellitus without complication, without long-term current use of insulin (HCC) Assessment & Plan: Send glucometer kit to patient's pharmacy as noted. Counseled patient further regarding improving diet and increasing exercise. Repeat hemoglobin A1c 6.5 today. Plan to check urine microalbumin/creatinine ratio at next visit.  Orders: -     Blood Glucose Monitor System; Use as directed  Dispense: 1 kit; Refill: 0 -     Blood Glucose Test; Use daily before breakfast  Dispense: 100 strip; Refill: 3 -     Lancet Device; 1 each by Does not apply route daily before breakfast. May substitute to any manufacturer covered by patient's insurance.  Dispense: 1 each; Refill: 0 -     Accu-Chek Softclix Lancets; Use 1 each daily before breakfast.  Dispense: 100 each; Refill: 3  Essential hypertension Assessment & Plan: Mildly elevated today.  Patient did not take his blood pressure  medication this morning.  Will recheck on next visit. Continue losartan 100 mg daily and metoprolol XL 100 mg daily.   Chronic dental infection Assessment & Plan: Patient does not have a dental appointment scheduled for several months.  Will go ahead and treat his current infection with Augmentin as noted.  Prescribed chlorhexidine gluconate for him to rinse his mouth with 2 times daily, to start after completing the Augmentin intake through his dental appointment in order to try to prevent an additional infection starting prior to the appointment.  Orders: -     Chlorhexidine Gluconate; Use as directed 15 mLs in the mouth or throat 2 (two)  times daily. Start on last day of antibiotic and take through appointment.  Dispense: 473 mL; Refill: 2 -     Amoxicillin-Pot Clavulanate; Take 1 tablet by mouth 2 (two) times daily.  Dispense: 14 tablet; Refill: 0  Nicotine dependence with current use Assessment & Plan: Patient remains precontemplative at this time.     Return in about 4 weeks (around 06/07/2023) for DM, CPE.      I discussed the assessment and treatment plan with the patient  The patient was provided an opportunity to ask questions and all were answered. The patient agreed with the plan and demonstrated an understanding of the instructions.   The patient was advised to call back or seek an in-person evaluation if the symptoms worsen or if the condition fails to improve as anticipated.    Sherlyn Hay, DO  [facility] [phone] (phone) [phone] (fax)  [facility]

## 2023-05-11 ENCOUNTER — Other Ambulatory Visit: Payer: Self-pay

## 2023-05-11 LAB — LIPID PANEL
Chol/HDL Ratio: 6.1 ratio — ABNORMAL HIGH (ref 0.0–5.0)
Cholesterol, Total: 207 mg/dL — ABNORMAL HIGH (ref 100–199)
HDL: 34 mg/dL — ABNORMAL LOW (ref >39)
LDL Chol Calc (NIH): 123 mg/dL — ABNORMAL HIGH (ref 0–99)
Triglycerides: 280 mg/dL — ABNORMAL HIGH (ref 0–149)
VLDL Cholesterol Cal: 50 mg/dL — ABNORMAL HIGH (ref 5–40)

## 2023-05-11 LAB — HEMOGLOBIN A1C
Est. average glucose Bld gHb Est-mCnc: 140 mg/dL
Hgb A1c MFr Bld: 6.5 % — ABNORMAL HIGH (ref 4.8–5.6)

## 2023-05-11 LAB — HCV INTERPRETATION

## 2023-05-11 LAB — COMPREHENSIVE METABOLIC PANEL
ALT: 15 IU/L (ref 0–44)
AST: 17 IU/L (ref 0–40)
Albumin: 4.6 g/dL (ref 3.8–4.9)
Alkaline Phosphatase: 64 IU/L (ref 44–121)
BUN/Creatinine Ratio: 11 (ref 9–20)
BUN: 10 mg/dL (ref 6–24)
Bilirubin Total: 0.3 mg/dL (ref 0.0–1.2)
CO2: 23 mmol/L (ref 20–29)
Calcium: 9.7 mg/dL (ref 8.7–10.2)
Chloride: 102 mmol/L (ref 96–106)
Creatinine, Ser: 0.87 mg/dL (ref 0.76–1.27)
Globulin, Total: 2.3 g/dL (ref 1.5–4.5)
Glucose: 89 mg/dL (ref 70–99)
Potassium: 4.6 mmol/L (ref 3.5–5.2)
Sodium: 139 mmol/L (ref 134–144)
Total Protein: 6.9 g/dL (ref 6.0–8.5)
eGFR: 102 mL/min/{1.73_m2} (ref >59)

## 2023-05-11 LAB — HIV ANTIBODY (ROUTINE TESTING W REFLEX): HIV Screen 4th Generation wRfx: NONREACTIVE

## 2023-05-11 LAB — VITAMIN B12: Vitamin B-12: 382 pg/mL (ref 232–1245)

## 2023-05-11 LAB — HCV AB W REFLEX TO QUANT PCR: HCV Ab: NONREACTIVE

## 2023-05-14 DIAGNOSIS — K047 Periapical abscess without sinus: Secondary | ICD-10-CM | POA: Insufficient documentation

## 2023-05-14 NOTE — Assessment & Plan Note (Signed)
Send glucometer kit to patient's pharmacy as noted. Counseled patient further regarding improving diet and increasing exercise. Repeat hemoglobin A1c 6.5 today. Plan to check urine microalbumin/creatinine ratio at next visit.

## 2023-05-14 NOTE — Assessment & Plan Note (Signed)
Mildly elevated today.  Patient did not take his blood pressure medication this morning.  Will recheck on next visit. Continue losartan 100 mg daily and metoprolol XL 100 mg daily.

## 2023-05-14 NOTE — Assessment & Plan Note (Addendum)
Patient does not have a dental appointment scheduled for several months.  Will go ahead and treat his current infection with Augmentin as noted.  Prescribed chlorhexidine gluconate for him to rinse his mouth with 2 times daily, to start after completing the Augmentin intake through his dental appointment in order to try to prevent an additional infection starting prior to the appointment.

## 2023-05-14 NOTE — Assessment & Plan Note (Signed)
Patient remains precontemplative at this time.

## 2023-05-16 NOTE — Congregational Nurse Program (Signed)
  Dept: [phone]   Congregational Nurse Program Note  Date of Encounter: 05/16/2023 Client to [facility] day center with questions regarding his PCP apt last week. He was given a glucometer and was instructed to check his blood sugar each morning, which he has been doing. He is keep a good record of it in a note book. He is taking his Metformin daily. He reports having difficulty remembering to take his medications. RN offered to help client set a reminder in his telephone. He does voice understanding about how important taking his medications regularly is. He requested some assistance with eating correctly based on his food stamp budget. RN was able to provided some basic information and will continue to work on a meal plan for him. Francesco Runner BSN, RN Past Medical History: Past Medical History:  Diagnosis Date   Arthritis    Asthma    Elevated lipids 12/17/2020   Encounter to establish care 11/25/2020   GERD (gastroesophageal reflux disease)    Hypertension    Pre-diabetes    Sleep apnea    Type 2 diabetes mellitus without complication, without long-term current use of insulin (HCC) 03/27/2023    Encounter Details:  CNP Questionnaire - 05/16/23 1100       Questionnaire   Ask client: Do you give verbal consent for me to treat you today? Yes    Student Assistance N/A    Location Patient Served  [facility]    Visit Setting with Client Organization    Patient Status Unknown   client does ave housing through a Holiday representative program   Insurance Medicaid   [place] Complete health   Insurance/Financial Assistance Referral N/A    Medication N/A   client is able to obtain medications precribed  from [facility] community [place]   Medical Provider Yes   [facility], Jacquenette Shone MD   Screening Referrals Made N/A   Client has a chest CT scheduled at 2:30 today 9/24   Medical Referrals Made N/A   RN attempted to make a dental apt for client, called 3 no new  patient apts avail. until Feb 2024. Client is calling [employer] daily in an attempt to get an apt   Medical Appointment Made N/A    Recently w/o PCP, now 1st time PCP visit completed due to CNs referral or appointment made N/A   [facility], client is now established   Food N/A   client uses Programme researcher, broadcasting/film/video and food stamps.   Transportation N/A   Has Medicaid transportation and a friend that provides transporation. RN explained again how to use the [place] transportation and offered assistance as needed   Housing/Utilities N/A   client now has housing   Interpersonal Safety N/A    Interventions Advocate/Support;Case Management;Reviewed Medications;Educate   Assisted with diet education   Abnormal to Normal Screening Since Last CN Visit N/A    Screenings CN Performed N/A    Sent Client to Lab for: N/A    Did client attend any of the following based off CNs referral or appointments made? N/A   client did attend his new patient apt on 5/23 with [facility] practice   ED Visit Averted N/A    Life-Saving Intervention Made N/A

## 2023-05-17 ENCOUNTER — Telehealth: Payer: Self-pay

## 2023-05-17 DIAGNOSIS — I739 Peripheral vascular disease, unspecified: Secondary | ICD-10-CM

## 2023-05-17 NOTE — Addendum Note (Signed)
Addended by: Jacquenette Shone on: 05/17/2023 04:46 PM   Modules accepted: Orders

## 2023-05-17 NOTE — Telephone Encounter (Signed)
Copied from CRM [id number]. Topic: Referral - Request for Referral >> May 16, 2023  9:11 AM Everette C wrote: Has patient seen PCP for this complaint? Yes.   *If NO, is insurance requiring patient see PCP for this issue before PCP can refer them? Referral for which specialty: Vein and Vascular Specialist  Preferred provider/office:  Vein and Vascular  Reason for referral: Patient has leg discomfort in both legs  Please contact the patient further if/when possible >> May 17, 2023 12:08 PM Reeves Forth wrote: Rerouting to department [facility] pool

## 2023-05-22 ENCOUNTER — Other Ambulatory Visit: Payer: Self-pay

## 2023-05-22 ENCOUNTER — Other Ambulatory Visit: Payer: Self-pay | Admitting: Acute Care

## 2023-05-22 DIAGNOSIS — Z122 Encounter for screening for malignant neoplasm of respiratory organs: Secondary | ICD-10-CM

## 2023-05-22 DIAGNOSIS — F17210 Nicotine dependence, cigarettes, uncomplicated: Secondary | ICD-10-CM

## 2023-05-22 DIAGNOSIS — Z87891 Personal history of nicotine dependence: Secondary | ICD-10-CM

## 2023-05-30 NOTE — Congregational Nurse Program (Signed)
  Dept: [phone]   Congregational Nurse Program Note  Date of Encounter: 05/30/2023 Client to [facility] to show this Rn his documented daily blood sugars. He is tracking his blood sugar regularly every morning. He is taking his metformin daily. Most reading are between 106 and 120. He is working on his diet and also trying to exercise some. He reports he has difficulty walking very far due to some chronic leg pain. He does have a vascular apt scheduled next month to investigate this. He is currently taking a stain for his cholesterol, but his leg pain was present prior to starting this medication. His next PCP apt is 10/21. He reports he does not have any transportation needs for that appointment. RN to continue to provided support, education and encouragement at each visit as client remains motivated and invested in improving his health. Madolyn Frieze Past Medical History: Past Medical History:  Diagnosis Date   Arthritis    Asthma    Elevated lipids 12/17/2020   Encounter to establish care 11/25/2020   GERD (gastroesophageal reflux disease)    Hypertension    Pre-diabetes    Sleep apnea    Type 2 diabetes mellitus without complication, without long-term current use of insulin (HCC) 03/27/2023    Encounter Details:  Community Questionnaire - 05/30/23 1040       Questionnaire   Ask client: Do you give verbal consent for me to treat you today? Yes    Student Assistance N/A    Location Patient Served  [facility]    Encounter Setting CN site    Population Status Unknown   client does ave housing through a Scientist, research (life sciences)   Insurance Medicaid   [place] Complete health   Insurance/Financial Assistance Referral N/A    Medication N/A   client is able to obtain medications precribed  from [facility] community [place]   Medical Provider Yes   [facility], Jacquenette Shone MD   Screening Referrals Made N/A   Client has a chest CT scheduled at 2:30 today  9/24   Medical Referrals Made N/A   RN attempted to make a dental apt for client, called 3 no new patient apts avail. until Feb 2024   Medical Appointment Completed N/A    CNP Interventions Advocate/Support;Educate   assisted client with some [employer] insurance questions and concerns. Contacted medicaid direct to find dental providers in this area   Screenings CN Performed N/A    ED Visit Averted N/A    Life-Saving Intervention Made N/A      Questionnaire   Housing/Utilities N/A   client now has housing

## 2023-05-31 ENCOUNTER — Other Ambulatory Visit: Payer: Self-pay

## 2023-05-31 DIAGNOSIS — E782 Mixed hyperlipidemia: Secondary | ICD-10-CM

## 2023-05-31 MED ORDER — ROSUVASTATIN CALCIUM 40 MG PO TABS
40.0000 mg | ORAL_TABLET | Freq: Every day | ORAL | 3 refills | Status: DC
Start: 2023-05-31 — End: 2024-07-10
  Filled 2023-05-31: qty 90, 90d supply, fill #0
  Filled 2023-09-12: qty 90, 90d supply, fill #1
  Filled 2024-01-31 – 2024-02-06 (×2): qty 90, 90d supply, fill #2
  Filled 2024-05-08: qty 90, 90d supply, fill #3

## 2023-06-05 ENCOUNTER — Other Ambulatory Visit: Payer: Self-pay

## 2023-06-05 ENCOUNTER — Ambulatory Visit (INDEPENDENT_AMBULATORY_CARE_PROVIDER_SITE_OTHER): Payer: Medicaid Other | Admitting: Family Medicine

## 2023-06-05 ENCOUNTER — Encounter: Payer: Self-pay | Admitting: Family Medicine

## 2023-06-05 VITALS — BP 98/75 | HR 65 | Temp 97.6°F | Ht 73.0 in | Wt 213.0 lb

## 2023-06-05 DIAGNOSIS — Z0001 Encounter for general adult medical examination with abnormal findings: Secondary | ICD-10-CM

## 2023-06-05 DIAGNOSIS — E119 Type 2 diabetes mellitus without complications: Secondary | ICD-10-CM

## 2023-06-05 DIAGNOSIS — I10 Essential (primary) hypertension: Secondary | ICD-10-CM

## 2023-06-05 DIAGNOSIS — Z716 Tobacco abuse counseling: Secondary | ICD-10-CM

## 2023-06-05 DIAGNOSIS — Z7984 Long term (current) use of oral hypoglycemic drugs: Secondary | ICD-10-CM

## 2023-06-05 DIAGNOSIS — E782 Mixed hyperlipidemia: Secondary | ICD-10-CM

## 2023-06-05 DIAGNOSIS — Z0000 Encounter for general adult medical examination without abnormal findings: Secondary | ICD-10-CM

## 2023-06-05 NOTE — Progress Notes (Signed)
Complete physical exam   Patient: Jason Livingston   DOB: June 12, 1966   57 y.o. Male  MRN: [id number] Visit Date: 06/05/2023  Today's healthcare provider: Sherlyn Hay, DO   Chief Complaint  Patient presents with   Diabetes   Hyperlipidemia    Patient was seen 1 month a go.  At that time his Crestor was increased from 20 to 40 mg daily.    Subjective    Jason Livingston is a 57 y.o. male who presents today for a complete physical exam.  He reports consuming a  low carb  diet but would like more information about how to choose low-carb options. The patient does not participate in regular exercise at present due to significant leg pain (max 1/4 mile before having pain). He has been trying to do weighted exercises with his arms. He generally feels poorly. He reports sleeping intermittently well; he has been taking magnesium supplements to help. He does not have additional problems to discuss today.   HPI HPI     Hyperlipidemia    Additional comments: Patient was seen 1 month a go.  At that time his Crestor was increased from 20 to 40 mg daily.       Last edited by Adline Peals, CMA on 06/05/2023  3:29 PM.      Has an appointment for vein and vascular Does have an appointment for the eye doctor.  Thinks his blood pressure and breathing have both improved since his stopped drinking.  BG - was able to get a glucometer  - fasting BG running 100s-130s with one reading of 144 (9/26-10/21)  Arch of feet very tender when he first gets out of bed; resolved after a few minutes of stretching Numbness/tingling worse when walking, improves with sitting still  Labs UTD Stopped drinking alcohol 03/10/23   Past Medical History:  Diagnosis Date   Arthritis    Asthma    Elevated lipids 12/17/2020   Encounter to establish care 11/25/2020   GERD (gastroesophageal reflux disease)    Hypertension    Pre-diabetes    Sleep apnea    Type 2 diabetes mellitus without complication,  without long-term current use of insulin (HCC) 03/27/2023   Past Surgical History:  Procedure Laterality Date   COLONOSCOPY N/A 03/09/2021   Procedure: COLONOSCOPY;  Surgeon: Wyline Mood, MD;  Location: [facility];  Service: Gastroenterology;  Laterality: N/A;   MANDIBLE FRACTURE SURGERY     Social History   Socioeconomic History   Marital status: Single    Spouse name: Not on file   Number of children: Not on file   Years of education: Not on file   Highest education level: Associate degree: occupational, Scientist, product/process development, or vocational program  Occupational History   Not on file  Tobacco Use   Smoking status: Every Day    Current packs/day: 0.50    Average packs/day: 0.5 packs/day for 37.0 years (18.5 ttl pk-yrs)    Types: Cigarettes   Smokeless tobacco: Former    Quit date: 2012  Vaping Use   Vaping status: Never Used  Substance and Sexual Activity   Alcohol use: Yes    Alcohol/week: 14.0 standard drinks of alcohol    Types: 14 Cans of beer per week    Comment: last use end of March 2024 "1 beer per day"; hx of 2 40oz beers at night to sleep, 02/18/21 20oz with dinner everynight   Drug use: Not Currently    Types: Cocaine, Marijuana,  LSD    Comment: used in his 20's, has not used in 30 years   Sexual activity: Not Currently  Other Topics Concern   Not on file  Social History Narrative   Not on file   Social Determinants of Health   Financial Resource Strain: High Risk (03/27/2023)   Overall Financial Resource Strain (CARDIA)    Difficulty of Paying Living Expenses: Very hard  Food Insecurity: Food Insecurity Present (05/30/2023)   Hunger Vital Sign    Worried About Running Out of Food in the Last Year: Sometimes true    Ran Out of Food in the Last Year: Sometimes true  Transportation Needs: Unmet Transportation Needs (05/30/2023)   PRAPARE - Administrator, Civil Service (Medical): Yes    Lack of Transportation (Non-Medical): No  Physical Activity: Unknown  (03/27/2023)   Exercise Vital Sign    Days of Exercise per Week: 0 days    Minutes of Exercise per Session: Not on file  Stress: Stress Concern Present (03/27/2023)   [employer] of Occupational Health - Occupational Stress Questionnaire    Feeling of Stress : To some extent  Social Connections: Moderately Integrated (03/27/2023)   Social Connection and Isolation Panel [NHANES]    Frequency of Communication with Friends and Family: Twice a week    Frequency of Social Gatherings with Friends and Family: Twice a week    Attends Religious Services: More than 4 times per year    Active Member of [employer] or Organizations: Yes    Attends Banker Meetings: More than 4 times per year    Marital Status: Never married  Intimate Partner Violence: Unknown (05/20/2021)   Humiliation, Afraid, Rape, and Kick questionnaire    Fear of Current or Ex-Partner: No    Emotionally Abused: No    Physically Abused: Not on file    Sexually Abused: Not on file   Family Status  Relation Name Status   Mother  Alive   Father Unknown Other   Oceanographer  (Not Specified)   MGM  Deceased   MGF  Deceased   Cousin  Alive       kidney transplant  No partnership data on file   Family History  Problem Relation Age of Onset   Other Mother        unknown medical history   Other Father        unknwon medical history   Hypertension Maternal Grandmother    Heart attack Maternal Grandfather 76   Heart disease Maternal Grandfather    Other Cousin        kidney transplant   No Known Allergies  Patient Care Team: Amrie Gurganus, Monico Blitz, DO as PCP - General (Family Medicine)   Medications: Outpatient Medications Prior to Visit  Medication Sig   Accu-Chek Softclix Lancets lancets Use 1 each daily before breakfast.   albuterol (VENTOLIN HFA) 108 (90 Base) MCG/ACT inhaler Inhale 2 puffs into the lungs every 6 (six) hours as needed for wheezing or shortness of breath.   aspirin (ASPIRIN ADULT LOW STRENGTH) 81  MG EC tablet Take 1 tablet (81 mg total) by mouth once daily. Swallow whole.   Blood Glucose Monitoring Suppl (BLOOD GLUCOSE MONITOR SYSTEM) w/Device KIT Use as directed   chlorhexidine (PERIDEX) 0.12 % solution Use as directed 15 mLs in the mouth or throat 2 (two) times daily. Start on last day of antibiotic and take through appointment.   cyanocobalamin (VITAMIN B12) 1000 MCG tablet Take  1 tablet (1,000 mcg total) by mouth daily.   diclofenac Sodium (VOLTAREN) 1 % GEL Apply 2 g topically 4 (four) times daily.   fluticasone furoate-vilanterol (BREO ELLIPTA) 200-25 MCG/ACT AEPB Inhale 1 puff into the lungs once daily.   Glucose Blood (BLOOD GLUCOSE TEST STRIPS) STRP Use daily before breakfast   ibuprofen (ADVIL) 200 MG tablet Take 800 mg by mouth as needed for headache.   Lancet Device MISC 1 each by Does not apply route daily before breakfast. May substitute to any manufacturer covered by patient's insurance.   losartan (COZAAR) 100 MG tablet Take 1 tablet (100 mg total) by mouth daily.   metFORMIN (GLUCOPHAGE-XR) 500 MG 24 hr tablet Take 2 tablets (1,000 mg total) by mouth once daily with breakfast.   metoprolol succinate (TOPROL XL) 100 MG 24 hr tablet Take 1 tablet (100 mg total) by mouth once daily.   metoprolol tartrate (LOPRESSOR) 25 MG tablet Take 1 tablet (25 mg total) by mouth once daily as needed. (For elevated heart rates or SVT).   omeprazole (PRILOSEC) 20 MG capsule Take 1 capsule (20 mg total) by mouth once daily.   rosuvastatin (CRESTOR) 40 MG tablet Take 1 tablet (40 mg total) by mouth daily.   thiamine (VITAMIN B-1) 100 MG tablet Take 1 tablet (100 mg total) by mouth once daily.   [DISCONTINUED] amoxicillin-clavulanate (AUGMENTIN) 875-125 MG tablet Take 1 tablet by mouth 2 (two) times daily.   No facility-administered medications prior to visit.    Review of Systems  Constitutional:  Negative for chills and fever.  HENT:  Positive for sneezing. Negative for congestion, ear  pain, sinus pain and sore throat.   Respiratory: Negative.  Negative for cough, shortness of breath and wheezing.   Cardiovascular:  Negative for chest pain, palpitations and leg swelling.  Gastrointestinal:  Positive for nausea (intermittent). Negative for constipation, diarrhea and vomiting.  Neurological:  Positive for dizziness (gets it intermittently; has been ongoing for past few months). Negative for weakness and headaches.      Objective    BP 98/75 (BP Location: Left Arm, Patient Position: Sitting, Cuff Size: Normal)   Pulse 65   Temp 97.6 F (36.4 C) (Oral)   Ht 6\' 1"  (1.854 m)   Wt 213 lb (96.6 kg)   SpO2 97%   BMI 28.10 kg/m    Physical Exam Vitals and nursing note reviewed.  Constitutional:      General: He is awake.     Appearance: Normal appearance.  HENT:     Head: Normocephalic and atraumatic.     Right Ear: Tympanic membrane, ear canal and external ear normal.     Left Ear: Tympanic membrane, ear canal and external ear normal.     Nose: Nose normal.     Mouth/Throat:     Mouth: Mucous membranes are moist.     Pharynx: Oropharynx is clear. No oropharyngeal exudate or posterior oropharyngeal erythema.  Eyes:     General: No scleral icterus.    Extraocular Movements: Extraocular movements intact.     Conjunctiva/sclera: Conjunctivae normal.     Pupils: Pupils are equal, round, and reactive to light.  Neck:     Thyroid: No thyromegaly or thyroid tenderness.  Cardiovascular:     Rate and Rhythm: Normal rate and regular rhythm.     Pulses:          Dorsalis pedis pulses are 2+ on the right side and 2+ on the left side.  Posterior tibial pulses are 2+ on the right side and 2+ on the left side.     Heart sounds: Normal heart sounds.  Pulmonary:     Effort: Pulmonary effort is normal. No tachypnea, bradypnea or respiratory distress.     Breath sounds: Normal breath sounds. No stridor. No wheezing, rhonchi or rales.  Abdominal:     General: Bowel sounds  are normal. There is no distension.     Palpations: Abdomen is soft. There is no mass.     Tenderness: There is no abdominal tenderness. There is no guarding.     Hernia: No hernia is present.  Musculoskeletal:     Cervical back: Normal range of motion and neck supple.     Right lower leg: No edema.     Left lower leg: No edema.     Right foot: Normal range of motion. No deformity, bunion, Charcot foot, foot drop or prominent metatarsal heads.     Left foot: Normal range of motion. No deformity, bunion, Charcot foot, foot drop or prominent metatarsal heads.  Feet:     Right foot:     Protective Sensation: 10 sites tested.  10 sites sensed.     Skin integrity: No ulcer, blister, skin breakdown, erythema, warmth, callus, dry skin or fissure.     Toenail Condition: Right toenails are long.     Left foot:     Protective Sensation: 10 sites tested.  10 sites sensed.     Skin integrity: No ulcer, blister, skin breakdown, erythema, warmth, callus, dry skin or fissure.     Toenail Condition: Left toenails are long.  Lymphadenopathy:     Cervical: No cervical adenopathy.  Skin:    General: Skin is warm and dry.  Neurological:     Mental Status: He is alert and oriented to person, place, and time. Mental status is at baseline.  Psychiatric:        Mood and Affect: Mood normal.        Behavior: Behavior normal.      Last depression screening scores    05/10/2023    3:33 PM 02/09/2023    1:46 PM 12/07/2021    3:23 PM  PHQ 2/9 Scores  PHQ - 2 Score 1 1 0  PHQ- 9 Score 8  6   Last fall risk screening    05/10/2023    3:33 PM  Fall Risk   Falls in the past year? 1  Number falls in past yr: 0  Injury with Fall? 0   Last Audit-C alcohol use screening    06/05/2023    4:07 PM  Alcohol Use Disorder Test (AUDIT)  1. How often do you have a drink containing alcohol? 0  2. How many drinks containing alcohol do you have on a typical day when you are drinking? 0  3. How often do you have  six or more drinks on one occasion? 0  AUDIT-C Score 0   A score of 3 or more in women, and 4 or more in men indicates increased risk for alcohol abuse, EXCEPT if all of the points are from question 1   Results for orders placed or performed in visit on 06/05/23  Microalbumin / creatinine urine ratio  Result Value Ref Range   Creatinine, Urine 47.7 Not Estab. mg/dL   Microalbumin, Urine <2.5 Not Estab. ug/mL   Microalb/Creat Ratio <6 0 - 29 mg/g creat    Assessment & Plan    Routine Health Maintenance  and Physical Exam  Exercise Activities and Dietary recommendations  Goals   None     There is no immunization history on file for this patient.  Health Maintenance  Topic Date Due   COVID-19 Vaccine (1) Never done   DTaP/Tdap/Td (1 - Tdap) Never done   Zoster Vaccines- Shingrix (1 of 2) Never done   OPHTHALMOLOGY EXAM  06/16/2023 (Originally 12/22/1975)   INFLUENZA VACCINE  11/13/2023 (Originally 03/16/2023)   HEMOGLOBIN A1C  11/07/2023   Diabetic kidney evaluation - eGFR measurement  05/09/2024   Diabetic kidney evaluation - Urine ACR  06/04/2024   FOOT EXAM  06/04/2024   Colonoscopy  03/09/2028   Hepatitis C Screening  Completed   HIV Screening  Completed   HPV VACCINES  Aged Out   Lung Cancer Screening  Discontinued    Discussed health benefits of physical activity, and encouraged him to engage in regular exercise appropriate for his age and condition.   Annual physical exam Assessment & Plan: Physical exam overall unremarkable except as noted above.  Blood work is currently up-to-date.   Type 2 diabetes mellitus without complication, without long-term current use of insulin (HCC) Assessment & Plan: Continue metformin XR 1000 mg daily with breakfast. Continue low carbohydrate diet. Referred to nutrition and dietetics for diabetic education.  Orders: -     Amb Referral to Nutrition and Diabetic Education  Essential hypertension Assessment & Plan: Patient's  blood pressure is within normal limits.  He is experiencing a small amount of intermittent dizziness.  However, given that his blood pressure is usually on the upper end of normal, we will have him monitor his blood pressures at home and schedule with cardiology for their input as well, as he is due for follow-up. Continue metoprolol XL 100 mg daily Continue losartan 100 mg daily.   Encounter for smoking cessation counseling Assessment & Plan: Patient is interested in quitting smoking.  However, since he quit his alcohol use not very long ago and is still working on maintaining that, he intends to defer quitting smoking until his 1 year anniversary of stopping alcohol (03/09/2024).   Mixed hyperlipidemia Assessment & Plan: Prior to this visit (05/31/2023) rosuvastatin increased to 40 mg daily.    Return in about 3 months (around 09/05/2023) for DM, HTN.     I discussed the assessment and treatment plan with the patient  The patient was provided an opportunity to ask questions and all were answered. The patient agreed with the plan and demonstrated an understanding of the instructions.   The patient was advised to call back or seek an in-person evaluation if the symptoms worsen or if the condition fails to improve as anticipated.    Sherlyn Hay, DO  [facility] [phone] (phone) [phone] (fax)  [facility]

## 2023-06-05 NOTE — Patient Instructions (Addendum)
Scheduled follow up with Cardiology - Dr. Okey [facility]

## 2023-06-06 LAB — MICROALBUMIN / CREATININE URINE RATIO
Creatinine, Urine: 47.7 mg/dL
Microalb/Creat Ratio: <6 mg/g{creat} (ref 0–29)
Microalbumin, Urine: <3 ug/mL

## 2023-06-06 NOTE — Congregational Nurse Program (Signed)
  Dept: [phone]   Congregational Nurse Program Note  Date of Encounter: 06/06/2023 Client to [facility] day center nurse led clinic with request for RN to review paperwork for his upcoming eye exam and letters from [facility]. Paper work reviewed and questions answered/assistance given. Client had a PCP apt last week and had 2 medication changes.  Metformin was increased from 500 mg daily to 1000 mg daily and Rouvastatin was increased to 40 mg daily. Client has picked up his prescriptions. He continues to track his morning blood glucose. Sharps container given at his request for the used lancets. Client also requested the phone number for the cardiologist he had seen last year. He was seen at [facility]. Number given to make an appointment at his PCP's request regarding a possible change in his cardiac medications. Client plans to return to clinic every Tuesday for continued support and assistance. Francesco Runner BSN, RN Past Medical History: Past Medical History:  Diagnosis Date   Arthritis    Asthma    Elevated lipids 12/17/2020   Encounter to establish care 11/25/2020   GERD (gastroesophageal reflux disease)    Hypertension    Pre-diabetes    Sleep apnea    Type 2 diabetes mellitus without complication, without long-term current use of insulin (HCC) 03/27/2023    Encounter Details:  Community Questionnaire - 06/06/23 1000       Questionnaire   Ask client: Do you give verbal consent for me to treat you today? Yes    Student Assistance N/A    Location Patient Served  [facility]    Encounter Setting CN site    Population Status Unknown   client does ave housing through a Scientist, research (life sciences)   Insurance Medicaid   [place] [facility]   Insurance/Financial Assistance Referral N/A    Medication N/A   client is able to obtain medications precribed  from [facility] community [place]   Medical Provider Yes   [facility], Jacquenette Shone MD    Screening Referrals Made N/A   Client has a chest CT scheduled at 2:30 today 9/24   Medical Referrals Made N/A   RN attempted to make a dental apt for client, called 3 no new patient apts avail. until Feb 2024   Medical Appointment Completed N/A    CNP Interventions Advocate/Support;Educate;Reviewed Medications;Navigate Healthcare System   assisted client with some [employer] insurance questions and concerns. Contacted medicaid direct to find dental providers in this area   Screenings CN Performed N/A    ED Visit Averted N/A    Life-Saving Intervention Made N/A      Questionnaire   Housing/Utilities N/A   client now has housing

## 2023-06-12 DIAGNOSIS — Z0000 Encounter for general adult medical examination without abnormal findings: Secondary | ICD-10-CM | POA: Insufficient documentation

## 2023-06-12 DIAGNOSIS — Z716 Tobacco abuse counseling: Secondary | ICD-10-CM | POA: Insufficient documentation

## 2023-06-12 NOTE — Assessment & Plan Note (Addendum)
Continue metformin XR 1000 mg daily with breakfast. Continue low carbohydrate diet. Referred to nutrition and dietetics for diabetic education.

## 2023-06-12 NOTE — Assessment & Plan Note (Signed)
Prior to this visit (05/31/2023) rosuvastatin increased to 40 mg daily.

## 2023-06-12 NOTE — Assessment & Plan Note (Signed)
Patient's blood pressure is within normal limits.  He is experiencing a small amount of intermittent dizziness.  However, given that his blood pressure is usually on the upper end of normal, we will have him monitor his blood pressures at home and schedule with cardiology for their input as well, as he is due for follow-up. Continue metoprolol XL 100 mg daily Continue losartan 100 mg daily.

## 2023-06-12 NOTE — Assessment & Plan Note (Signed)
Physical exam overall unremarkable except as noted above.  Blood work is currently up-to-date.

## 2023-06-12 NOTE — Assessment & Plan Note (Signed)
Patient is interested in quitting smoking.  However, since he quit his alcohol use not very long ago and is still working on maintaining that, he intends to defer quitting smoking until his 1 year anniversary of stopping alcohol (03/09/2024).

## 2023-06-19 ENCOUNTER — Other Ambulatory Visit: Payer: Self-pay

## 2023-06-20 ENCOUNTER — Other Ambulatory Visit (INDEPENDENT_AMBULATORY_CARE_PROVIDER_SITE_OTHER): Payer: Self-pay | Admitting: Vascular Surgery

## 2023-06-20 DIAGNOSIS — I739 Peripheral vascular disease, unspecified: Secondary | ICD-10-CM

## 2023-06-26 ENCOUNTER — Ambulatory Visit (INDEPENDENT_AMBULATORY_CARE_PROVIDER_SITE_OTHER): Payer: Medicaid Other

## 2023-06-26 ENCOUNTER — Other Ambulatory Visit: Payer: Self-pay

## 2023-06-26 ENCOUNTER — Ambulatory Visit (INDEPENDENT_AMBULATORY_CARE_PROVIDER_SITE_OTHER): Payer: Medicaid Other | Admitting: Vascular Surgery

## 2023-06-26 ENCOUNTER — Encounter (INDEPENDENT_AMBULATORY_CARE_PROVIDER_SITE_OTHER): Payer: Self-pay | Admitting: Vascular Surgery

## 2023-06-26 VITALS — BP 145/88 | HR 60 | Resp 18 | Ht 73.0 in | Wt 215.4 lb

## 2023-06-26 DIAGNOSIS — I10 Essential (primary) hypertension: Secondary | ICD-10-CM

## 2023-06-26 DIAGNOSIS — I471 Supraventricular tachycardia, unspecified: Secondary | ICD-10-CM

## 2023-06-26 DIAGNOSIS — E782 Mixed hyperlipidemia: Secondary | ICD-10-CM

## 2023-06-26 DIAGNOSIS — I739 Peripheral vascular disease, unspecified: Secondary | ICD-10-CM

## 2023-06-26 DIAGNOSIS — I70222 Atherosclerosis of native arteries of extremities with rest pain, left leg: Secondary | ICD-10-CM

## 2023-06-26 DIAGNOSIS — J4540 Moderate persistent asthma, uncomplicated: Secondary | ICD-10-CM

## 2023-06-27 LAB — VAS US ABI WITH/WO TBI
Left ABI: 0.87
Right ABI: 1.03

## 2023-06-28 ENCOUNTER — Encounter (INDEPENDENT_AMBULATORY_CARE_PROVIDER_SITE_OTHER): Payer: Self-pay | Admitting: Vascular Surgery

## 2023-06-28 DIAGNOSIS — I70229 Atherosclerosis of native arteries of extremities with rest pain, unspecified extremity: Secondary | ICD-10-CM | POA: Insufficient documentation

## 2023-06-28 NOTE — H&P (View-Only) (Signed)
 MRN : [id number]  Jason Livingston is a 57 y.o. (21-Aug-1965) male who presents with chief complaint of check circulation.  History of Present Illness:    The patient is seen for evaluation of painful lower extremities and diminished pulses. Patient notes the pain is always associated with activity and is very consistent day today. Typically, the pain occurs at less than one block, progress is as activity continues to the point that the patient must stop walking. Resting including standing still for several minutes allows the patient to walk a similar distance before being forced to stop again. Uneven terrain and inclines shorten the distance. The pain has been progressive over the past several years. The patient denies any abrupt changes in claudication symptoms.  The patient states the inability to walk is causing problems with daily activities.  He states that he has to walk to the grocery store to buy his food and then walk home and this is becoming increasingly difficult because of his short distance claudication.  The patient relates rest pain and notes that he is now sleeping in a chair because his foot hurts during the night when he tries to lay flat.  No open wounds or sores at this time.  No prior interventions or surgeries.  No history of back problems or DJD of the lumbar sacral spine.   The patient's blood pressure has been stable and relatively well controlled. The patient denies amaurosis fugax or recent TIA symptoms. There are no recent neurological changes noted. The patient denies history of DVT, PE or superficial thrombophlebitis. The patient denies recent episodes of angina or shortness of breath.   ABI Rt=1.03 and Lt=0.87   Current Meds  Medication Sig   albuterol (VENTOLIN HFA) 108 (90 Base) MCG/ACT inhaler Inhale 2 puffs into the lungs every 6 (six) hours as needed for wheezing or shortness of  breath.   aspirin (ASPIRIN ADULT LOW STRENGTH) 81 MG EC tablet Take 1 tablet (81 mg total) by mouth once daily. Swallow whole.   Blood Glucose Monitoring Suppl (BLOOD GLUCOSE MONITOR SYSTEM) w/Device KIT Use as directed   budesonide-formoterol (SYMBICORT) 160-4.5 MCG/ACT inhaler Inhale 2 puffs into the lungs 2 (two) times daily.   chlorhexidine (PERIDEX) 0.12 % solution Use as directed 15 mLs in the mouth or throat 2 (two) times daily. Start on last day of antibiotic and take through appointment.   cyanocobalamin (VITAMIN B12) 1000 MCG tablet Take 1 tablet (1,000 mcg total) by mouth daily.   diclofenac Sodium (VOLTAREN) 1 % GEL Apply 2 g topically 4 (four) times daily.   fluticasone furoate-vilanterol (BREO ELLIPTA) 200-25 MCG/ACT AEPB Inhale 1 puff into the lungs once daily.   Glucose Blood (BLOOD GLUCOSE TEST STRIPS) STRP Use daily before breakfast   ibuprofen (ADVIL) 200 MG tablet Take 800 mg by mouth as needed for headache.   losartan (COZAAR) 100 MG tablet Take 1 tablet (100 mg total) by mouth daily.   metFORMIN (GLUCOPHAGE-XR) 500 MG 24 hr tablet Take 2 tablets (1,000 mg total) by mouth once daily with breakfast.   metoprolol succinate (TOPROL XL) 100 MG  24 hr tablet Take 1 tablet (100 mg total) by mouth once daily.   metoprolol tartrate (LOPRESSOR) 25 MG tablet Take 1 tablet (25 mg total) by mouth once daily as needed. (For elevated heart rates or SVT).   omeprazole (PRILOSEC) 20 MG capsule Take 1 capsule (20 mg total) by mouth once daily.   rosuvastatin (CRESTOR) 40 MG tablet Take 1 tablet (40 mg total) by mouth daily.   thiamine (VITAMIN B-1) 100 MG tablet Take 1 tablet (100 mg total) by mouth once daily.    Past Medical History:  Diagnosis Date   Arthritis    Asthma    Elevated lipids 12/17/2020   Encounter to establish care 11/25/2020   GERD (gastroesophageal reflux disease)    Hypertension    Pre-diabetes    Sleep apnea    Type 2 diabetes mellitus without complication,  without long-term current use of insulin (HCC) 03/27/2023    Past Surgical History:  Procedure Laterality Date   COLONOSCOPY N/A 03/09/2021   Procedure: COLONOSCOPY;  Surgeon: Wyline Mood, MD;  Location: [facility] ENDOSCOPY;  Service: Gastroenterology;  Laterality: N/A;   MANDIBLE FRACTURE SURGERY      Social History Social History   Tobacco Use   Smoking status: Every Day    Current packs/day: 0.50    Average packs/day: 0.5 packs/day for 37.0 years (18.5 ttl pk-yrs)    Types: Cigarettes   Smokeless tobacco: Former    Quit date: 2012  Vaping Use   Vaping status: Never Used  Substance Use Topics   Alcohol use: Yes    Alcohol/week: 14.0 standard drinks of alcohol    Types: 14 Cans of beer per week    Comment: last use end of March 2024 "1 beer per day"; hx of 2 40oz beers at night to sleep, 02/18/21 20oz with dinner everynight   Drug use: Not Currently    Types: Cocaine, Marijuana, LSD    Comment: used in his 20's, has not used in 30 years    Family History Family History  Problem Relation Age of Onset   Other Mother        unknown medical history   Other Father        unknwon medical history   Hypertension Maternal Grandmother    Heart attack Maternal Grandfather 76   Heart disease Maternal Grandfather    Other Cousin        kidney transplant    No Known Allergies   REVIEW OF SYSTEMS (Negative unless checked)  Constitutional: [] Weight loss  [] Fever  [] Chills Cardiac: [] Chest pain   [] Chest pressure   [] Palpitations   [] Shortness of breath when laying flat   [] Shortness of breath with exertion. Vascular:  [x] Pain in legs with walking   [] Pain in legs at rest  [] History of DVT   [] Phlebitis   [] Swelling in legs   [] Varicose veins   [] Non-healing ulcers Pulmonary:   [] Uses home oxygen   [] Productive cough   [] Hemoptysis   [] Wheeze  [] COPD   [] Asthma Neurologic:  [] Dizziness   [] Seizures   [] History of stroke   [] History of TIA  [] Aphasia   [] Vissual changes   [] Weakness or  numbness in arm   [] Weakness or numbness in leg Musculoskeletal:   [] Joint swelling   [] Joint pain   [] Low back pain Hematologic:  [] Easy bruising  [] Easy bleeding   [] Hypercoagulable state   [] Anemic Gastrointestinal:  [] Diarrhea   [] Vomiting  [] Gastroesophageal reflux/heartburn   [] Difficulty swallowing. Genitourinary:  [] Chronic kidney disease   []   Difficult urination  [] Frequent urination   [] Blood in urine Skin:  [] Rashes   [] Ulcers  Psychological:  [] History of anxiety   []  History of major depression.  Physical Examination  Vitals:   06/26/23 1509  BP: (!) 145/88  Pulse: 60  Resp: 18  Weight: 215 lb 6.4 oz (97.7 kg)  Height: 6\' 1"  (1.854 m)   Body mass index is 28.42 kg/m. Gen: WD/WN, NAD Head: [place]/AT, No temporalis wasting.  Ear/Nose/Throat: Hearing grossly intact, nares w/o erythema or drainage Eyes: PER, EOMI, sclera nonicteric.  Neck: Supple, no masses.  No bruit or JVD.  Pulmonary:  Good air movement, no audible wheezing, no use of accessory muscles.  Cardiac: RRR, normal S1, S2, no Murmurs. Vascular:  mild trophic changes, no open wounds Vessel Right Left  Radial Palpable Palpable  PT Not Palpable Not Palpable  DP Not Palpable Not Palpable  Gastrointestinal: soft, non-distended. No guarding/no peritoneal signs.  Musculoskeletal: M/S 5/5 throughout.  No visible deformity.  Neurologic: CN 2-12 intact. Pain and light touch intact in extremities.  Symmetrical.  Speech is fluent. Motor exam as listed above. Psychiatric: Judgment intact, Mood & affect appropriate for pt's clinical situation. Dermatologic: No rashes or ulcers noted.  No changes consistent with cellulitis.   CBC Lab Results  Component Value Date   WBC 6.2 01/05/2023   HGB 14.4 01/05/2023   HCT 41.7 01/05/2023   MCV 98 (H) 01/05/2023   PLT 309 01/05/2023    BMET    Component Value Date/Time   NA 139 05/10/2023 1632   K 4.6 05/10/2023 1632   CL 102 05/10/2023 1632   CO2 23 05/10/2023 1632    GLUCOSE 89 05/10/2023 1632   GLUCOSE 109 (H) 12/27/2021 1638   BUN 10 05/10/2023 1632   CREATININE 0.87 05/10/2023 1632   CALCIUM 9.7 05/10/2023 1632   GFRNONAA >60 12/27/2021 1638   CrCl cannot be calculated (Patient's most recent lab result is older than the maximum 21 days allowed.).  COAG No results found for: "INR", "PROTIME"  Radiology VAS [place] ABI WITH/WO TBI  Result Date: 06/27/2023  LOWER EXTREMITY DOPPLER STUDY Patient Name:  Daymen Kosakowski  Date of Exam:   06/26/2023 Medical Rec #: [id number]       Accession #:    [id number] Date of Birth: March 10, 1966       Patient Gender: M Patient Age:   65 years Exam Location:  [facility] Procedure:      VAS [place] ABI WITH/WO TBI Referring Phys: Levora Dredge --------------------------------------------------------------------------------  Indications: Claudication.  Performing Technologist: Debbe Bales RVS  Examination Guidelines: A complete evaluation includes at minimum, Doppler waveform signals and systolic blood pressure reading at the level of bilateral brachial, anterior tibial, and posterior tibial arteries, when vessel segments are accessible. Bilateral testing is considered an integral part of a complete examination. Photoelectric Plethysmograph (PPG) waveforms and toe systolic pressure readings are included as required and additional duplex testing as needed. Limited examinations for reoccurring indications may be performed as noted.  ABI Findings: +---------+------------------+-----+---------+--------+ Right    Rt Pressure (mmHg)IndexWaveform Comment  +---------+------------------+-----+---------+--------+ Brachial 116                                      +---------+------------------+-----+---------+--------+ ATA      107               0.91 biphasic          +---------+------------------+-----+---------+--------+  PTA      122               1.03 triphasic          +---------+------------------+-----+---------+--------+ Great Toe94                0.80 Normal            +---------+------------------+-----+---------+--------+ +---------+-----------------+-----+----------+---------------------------------+ Left     Lt Pressure      IndexWaveform  Comment                                    (mmHg)                                                            +---------+-----------------+-----+----------+---------------------------------+ Brachial 118                                                               +---------+-----------------+-----+----------+---------------------------------+ ATA      79               0.67 monophasic                                  +---------+-----------------+-----+----------+---------------------------------+ PTA      103              0.87 biphasic                                    +---------+-----------------+-----+----------+---------------------------------+ Great Toe95               0.81 Normal    2nd Toe due to pain in Lt Great                                            Toe                               +---------+-----------------+-----+----------+---------------------------------+ +-------+-----------+-----------+------------+------------+ ABI/TBIToday's ABIToday's TBIPrevious ABIPrevious TBI +-------+-----------+-----------+------------+------------+ Right  1.03       .80                                 +-------+-----------+-----------+------------+------------+ Left   .87        .81                                 +-------+-----------+-----------+------------+------------+ TOES Findings: +----------+---------------+--------+-------+ Right ToesPressure (mmHg)WaveformComment +----------+---------------+--------+-------+ 1st Digit 28             Normal          +----------+---------------+--------+-------+ 2nd Digit 49  Normal           +----------+---------------+--------+-------+ 3rd Digit 12             Normal          +----------+---------------+--------+-------+ 4th Digit 30             Normal          +----------+---------------+--------+-------+ 5th Digit 37             Normal          +----------+---------------+--------+-------+  +---------+---------------+--------+----------------------------------+ Left ToesPressure (mmHg)WaveformComment                            +---------+---------------+--------+----------------------------------+ 1st [id number]             AbnormalDiscoloration on side of Great Toe +---------+---------------+--------+----------------------------------+ 2nd Digit22             Normal                                     +---------+---------------+--------+----------------------------------+ 3rd Digit19             Normal                                     +---------+---------------+--------+----------------------------------+ 4th Digit28             Normal                                     +---------+---------------+--------+----------------------------------+ 5th Digit27             Normal                                     +---------+---------------+--------+----------------------------------+    Summary: Right: Resting right ankle-brachial index is within normal range. The right toe-brachial index is normal. Left: Resting left ankle-brachial index indicates mild left lower extremity arterial disease. The left toe-brachial index is normal. Discoloration on the side of the Left Great Toe causing pain; dampened ppg Waveform on Lt Great Toe.  *See table(s) above for measurements and observations.  Electronically signed by Levora Dredge MD on 06/27/2023 at 1:24:27 PM.    Final      Assessment/Plan 1. Atherosclerosis of native artery of left lower extremity with rest pain (HCC) Recommend:  The patient has evidence of severe atherosclerotic changes of both lower  extremities with rest pain that is associated with preulcerative changes and impending tissue loss of the left foot.  This represents a limb threatening ischemia and places the patient at the risk for left limb loss.  Patient should undergo angiography of the left lower extremity with the hope for intervention for limb salvage.  The risks and benefits as well as the alternative therapies was discussed in detail with the patient.  All questions were answered.  Patient agrees to proceed with left lower extremity angiography.  The patient will follow up with me in the office after the procedure.    I70.229    Atherosclerotic occlusive disease with rest pain  CPT codes: 62831   stent placement femoral-popliteal artery 36247   introduction catheter below diaphragm third order  2. Essential  hypertension Continue antihypertensive medications as already ordered, these medications have been reviewed and there are no changes at this time.  3. SVT (supraventricular tachycardia) (HCC) Continue antiarrhythmia medications as already ordered, these medications have been reviewed and there are no changes at this time.  Continue anticoagulation as ordered by Cardiology Service  4. Moderate persistent asthma without complication Continue pulmonary medications and aerosols as already ordered, these medications have been reviewed and there are no changes at this time.   5. Mixed hyperlipidemia Continue statin as ordered and reviewed, no changes at this time    Levora Dredge, MD  06/28/2023 8:36 PM

## 2023-06-28 NOTE — Progress Notes (Addendum)
MRN : [id number]  Jason Livingston is a 57 y.o. (21-Aug-1965) male who presents with chief complaint of check circulation.  History of Present Illness:    The patient is seen for evaluation of painful lower extremities and diminished pulses. Patient notes the pain is always associated with activity and is very consistent day today. Typically, the pain occurs at less than one block, progress is as activity continues to the point that the patient must stop walking. Resting including standing still for several minutes allows the patient to walk a similar distance before being forced to stop again. Uneven terrain and inclines shorten the distance. The pain has been progressive over the past several years. The patient denies any abrupt changes in claudication symptoms.  The patient states the inability to walk is causing problems with daily activities.  He states that he has to walk to the grocery store to buy his food and then walk home and this is becoming increasingly difficult because of his short distance claudication.  The patient relates rest pain and notes that he is now sleeping in a chair because his foot hurts during the night when he tries to lay flat.  No open wounds or sores at this time.  No prior interventions or surgeries.  No history of back problems or DJD of the lumbar sacral spine.   The patient's blood pressure has been stable and relatively well controlled. The patient denies amaurosis fugax or recent TIA symptoms. There are no recent neurological changes noted. The patient denies history of DVT, PE or superficial thrombophlebitis. The patient denies recent episodes of angina or shortness of breath.   ABI Rt=1.03 and Lt=0.87   Current Meds  Medication Sig   albuterol (VENTOLIN HFA) 108 (90 Base) MCG/ACT inhaler Inhale 2 puffs into the lungs every 6 (six) hours as needed for wheezing or shortness of  breath.   aspirin (ASPIRIN ADULT LOW STRENGTH) 81 MG EC tablet Take 1 tablet (81 mg total) by mouth once daily. Swallow whole.   Blood Glucose Monitoring Suppl (BLOOD GLUCOSE MONITOR SYSTEM) w/Device KIT Use as directed   budesonide-formoterol (SYMBICORT) 160-4.5 MCG/ACT inhaler Inhale 2 puffs into the lungs 2 (two) times daily.   chlorhexidine (PERIDEX) 0.12 % solution Use as directed 15 mLs in the mouth or throat 2 (two) times daily. Start on last day of antibiotic and take through appointment.   cyanocobalamin (VITAMIN B12) 1000 MCG tablet Take 1 tablet (1,000 mcg total) by mouth daily.   diclofenac Sodium (VOLTAREN) 1 % GEL Apply 2 g topically 4 (four) times daily.   fluticasone furoate-vilanterol (BREO ELLIPTA) 200-25 MCG/ACT AEPB Inhale 1 puff into the lungs once daily.   Glucose Blood (BLOOD GLUCOSE TEST STRIPS) STRP Use daily before breakfast   ibuprofen (ADVIL) 200 MG tablet Take 800 mg by mouth as needed for headache.   losartan (COZAAR) 100 MG tablet Take 1 tablet (100 mg total) by mouth daily.   metFORMIN (GLUCOPHAGE-XR) 500 MG 24 hr tablet Take 2 tablets (1,000 mg total) by mouth once daily with breakfast.   metoprolol succinate (TOPROL XL) 100 MG  24 hr tablet Take 1 tablet (100 mg total) by mouth once daily.   metoprolol tartrate (LOPRESSOR) 25 MG tablet Take 1 tablet (25 mg total) by mouth once daily as needed. (For elevated heart rates or SVT).   omeprazole (PRILOSEC) 20 MG capsule Take 1 capsule (20 mg total) by mouth once daily.   rosuvastatin (CRESTOR) 40 MG tablet Take 1 tablet (40 mg total) by mouth daily.   thiamine (VITAMIN B-1) 100 MG tablet Take 1 tablet (100 mg total) by mouth once daily.    Past Medical History:  Diagnosis Date   Arthritis    Asthma    Elevated lipids 12/17/2020   Encounter to establish care 11/25/2020   GERD (gastroesophageal reflux disease)    Hypertension    Pre-diabetes    Sleep apnea    Type 2 diabetes mellitus without complication,  without long-term current use of insulin (HCC) 03/27/2023    Past Surgical History:  Procedure Laterality Date   COLONOSCOPY N/A 03/09/2021   Procedure: COLONOSCOPY;  Surgeon: Wyline Mood, MD;  Location: [facility] ENDOSCOPY;  Service: Gastroenterology;  Laterality: N/A;   MANDIBLE FRACTURE SURGERY      Social History Social History   Tobacco Use   Smoking status: Every Day    Current packs/day: 0.50    Average packs/day: 0.5 packs/day for 37.0 years (18.5 ttl pk-yrs)    Types: Cigarettes   Smokeless tobacco: Former    Quit date: 2012  Vaping Use   Vaping status: Never Used  Substance Use Topics   Alcohol use: Yes    Alcohol/week: 14.0 standard drinks of alcohol    Types: 14 Cans of beer per week    Comment: last use end of March 2024 "1 beer per day"; hx of 2 40oz beers at night to sleep, 02/18/21 20oz with dinner everynight   Drug use: Not Currently    Types: Cocaine, Marijuana, LSD    Comment: used in his 20's, has not used in 30 years    Family History Family History  Problem Relation Age of Onset   Other Mother        unknown medical history   Other Father        unknwon medical history   Hypertension Maternal Grandmother    Heart attack Maternal Grandfather 76   Heart disease Maternal Grandfather    Other Cousin        kidney transplant    No Known Allergies   REVIEW OF SYSTEMS (Negative unless checked)  Constitutional: [] Weight loss  [] Fever  [] Chills Cardiac: [] Chest pain   [] Chest pressure   [] Palpitations   [] Shortness of breath when laying flat   [] Shortness of breath with exertion. Vascular:  [x] Pain in legs with walking   [] Pain in legs at rest  [] History of DVT   [] Phlebitis   [] Swelling in legs   [] Varicose veins   [] Non-healing ulcers Pulmonary:   [] Uses home oxygen   [] Productive cough   [] Hemoptysis   [] Wheeze  [] COPD   [] Asthma Neurologic:  [] Dizziness   [] Seizures   [] History of stroke   [] History of TIA  [] Aphasia   [] Vissual changes   [] Weakness or  numbness in arm   [] Weakness or numbness in leg Musculoskeletal:   [] Joint swelling   [] Joint pain   [] Low back pain Hematologic:  [] Easy bruising  [] Easy bleeding   [] Hypercoagulable state   [] Anemic Gastrointestinal:  [] Diarrhea   [] Vomiting  [] Gastroesophageal reflux/heartburn   [] Difficulty swallowing. Genitourinary:  [] Chronic kidney disease   []   Difficult urination  [] Frequent urination   [] Blood in urine Skin:  [] Rashes   [] Ulcers  Psychological:  [] History of anxiety   []  History of major depression.  Physical Examination  Vitals:   06/26/23 1509  BP: (!) 145/88  Pulse: 60  Resp: 18  Weight: 215 lb 6.4 oz (97.7 kg)  Height: 6\' 1"  (1.854 m)   Body mass index is 28.42 kg/m. Gen: WD/WN, NAD Head: [place]/AT, No temporalis wasting.  Ear/Nose/Throat: Hearing grossly intact, nares w/o erythema or drainage Eyes: PER, EOMI, sclera nonicteric.  Neck: Supple, no masses.  No bruit or JVD.  Pulmonary:  Good air movement, no audible wheezing, no use of accessory muscles.  Cardiac: RRR, normal S1, S2, no Murmurs. Vascular:  mild trophic changes, no open wounds Vessel Right Left  Radial Palpable Palpable  PT Not Palpable Not Palpable  DP Not Palpable Not Palpable  Gastrointestinal: soft, non-distended. No guarding/no peritoneal signs.  Musculoskeletal: M/S 5/5 throughout.  No visible deformity.  Neurologic: CN 2-12 intact. Pain and light touch intact in extremities.  Symmetrical.  Speech is fluent. Motor exam as listed above. Psychiatric: Judgment intact, Mood & affect appropriate for pt's clinical situation. Dermatologic: No rashes or ulcers noted.  No changes consistent with cellulitis.   CBC Lab Results  Component Value Date   WBC 6.2 01/05/2023   HGB 14.4 01/05/2023   HCT 41.7 01/05/2023   MCV 98 (H) 01/05/2023   PLT 309 01/05/2023    BMET    Component Value Date/Time   NA 139 05/10/2023 1632   K 4.6 05/10/2023 1632   CL 102 05/10/2023 1632   CO2 23 05/10/2023 1632    GLUCOSE 89 05/10/2023 1632   GLUCOSE 109 (H) 12/27/2021 1638   BUN 10 05/10/2023 1632   CREATININE 0.87 05/10/2023 1632   CALCIUM 9.7 05/10/2023 1632   GFRNONAA >60 12/27/2021 1638   CrCl cannot be calculated (Patient's most recent lab result is older than the maximum 21 days allowed.).  COAG No results found for: "INR", "PROTIME"  Radiology VAS [place] ABI WITH/WO TBI  Result Date: 06/27/2023  LOWER EXTREMITY DOPPLER STUDY Patient Name:  Daymen Kosakowski  Date of Exam:   06/26/2023 Medical Rec #: [id number]       Accession #:    [id number] Date of Birth: March 10, 1966       Patient Gender: M Patient Age:   65 years Exam Location:  [facility] Procedure:      VAS [place] ABI WITH/WO TBI Referring Phys: Levora Dredge --------------------------------------------------------------------------------  Indications: Claudication.  Performing Technologist: Debbe Bales RVS  Examination Guidelines: A complete evaluation includes at minimum, Doppler waveform signals and systolic blood pressure reading at the level of bilateral brachial, anterior tibial, and posterior tibial arteries, when vessel segments are accessible. Bilateral testing is considered an integral part of a complete examination. Photoelectric Plethysmograph (PPG) waveforms and toe systolic pressure readings are included as required and additional duplex testing as needed. Limited examinations for reoccurring indications may be performed as noted.  ABI Findings: +---------+------------------+-----+---------+--------+ Right    Rt Pressure (mmHg)IndexWaveform Comment  +---------+------------------+-----+---------+--------+ Brachial 116                                      +---------+------------------+-----+---------+--------+ ATA      107               0.91 biphasic          +---------+------------------+-----+---------+--------+  PTA      122               1.03 triphasic          +---------+------------------+-----+---------+--------+ Great Toe94                0.80 Normal            +---------+------------------+-----+---------+--------+ +---------+-----------------+-----+----------+---------------------------------+ Left     Lt Pressure      IndexWaveform  Comment                                    (mmHg)                                                            +---------+-----------------+-----+----------+---------------------------------+ Brachial 118                                                               +---------+-----------------+-----+----------+---------------------------------+ ATA      79               0.67 monophasic                                  +---------+-----------------+-----+----------+---------------------------------+ PTA      103              0.87 biphasic                                    +---------+-----------------+-----+----------+---------------------------------+ Great Toe95               0.81 Normal    2nd Toe due to pain in Lt Great                                            Toe                               +---------+-----------------+-----+----------+---------------------------------+ +-------+-----------+-----------+------------+------------+ ABI/TBIToday's ABIToday's TBIPrevious ABIPrevious TBI +-------+-----------+-----------+------------+------------+ Right  1.03       .80                                 +-------+-----------+-----------+------------+------------+ Left   .87        .81                                 +-------+-----------+-----------+------------+------------+ TOES Findings: +----------+---------------+--------+-------+ Right ToesPressure (mmHg)WaveformComment +----------+---------------+--------+-------+ 1st Digit 28             Normal          +----------+---------------+--------+-------+ 2nd Digit 49  Normal           +----------+---------------+--------+-------+ 3rd Digit 12             Normal          +----------+---------------+--------+-------+ 4th Digit 30             Normal          +----------+---------------+--------+-------+ 5th Digit 37             Normal          +----------+---------------+--------+-------+  +---------+---------------+--------+----------------------------------+ Left ToesPressure (mmHg)WaveformComment                            +---------+---------------+--------+----------------------------------+ 1st [id number]             AbnormalDiscoloration on side of Great Toe +---------+---------------+--------+----------------------------------+ 2nd Digit22             Normal                                     +---------+---------------+--------+----------------------------------+ 3rd Digit19             Normal                                     +---------+---------------+--------+----------------------------------+ 4th Digit28             Normal                                     +---------+---------------+--------+----------------------------------+ 5th Digit27             Normal                                     +---------+---------------+--------+----------------------------------+    Summary: Right: Resting right ankle-brachial index is within normal range. The right toe-brachial index is normal. Left: Resting left ankle-brachial index indicates mild left lower extremity arterial disease. The left toe-brachial index is normal. Discoloration on the side of the Left Great Toe causing pain; dampened ppg Waveform on Lt Great Toe.  *See table(s) above for measurements and observations.  Electronically signed by Levora Dredge MD on 06/27/2023 at 1:24:27 PM.    Final      Assessment/Plan 1. Atherosclerosis of native artery of left lower extremity with rest pain (HCC) Recommend:  The patient has evidence of severe atherosclerotic changes of both lower  extremities with rest pain that is associated with preulcerative changes and impending tissue loss of the left foot.  This represents a limb threatening ischemia and places the patient at the risk for left limb loss.  Patient should undergo angiography of the left lower extremity with the hope for intervention for limb salvage.  The risks and benefits as well as the alternative therapies was discussed in detail with the patient.  All questions were answered.  Patient agrees to proceed with left lower extremity angiography.  The patient will follow up with me in the office after the procedure.    I70.229    Atherosclerotic occlusive disease with rest pain  CPT codes: 62831   stent placement femoral-popliteal artery 36247   introduction catheter below diaphragm third order  2. Essential  hypertension Continue antihypertensive medications as already ordered, these medications have been reviewed and there are no changes at this time.  3. SVT (supraventricular tachycardia) (HCC) Continue antiarrhythmia medications as already ordered, these medications have been reviewed and there are no changes at this time.  Continue anticoagulation as ordered by Cardiology Service  4. Moderate persistent asthma without complication Continue pulmonary medications and aerosols as already ordered, these medications have been reviewed and there are no changes at this time.   5. Mixed hyperlipidemia Continue statin as ordered and reviewed, no changes at this time    Levora Dredge, MD  06/28/2023 8:36 PM

## 2023-06-29 LAB — HM DIABETES EYE EXAM

## 2023-07-06 ENCOUNTER — Ambulatory Visit: Payer: Medicaid Other | Attending: Medical | Admitting: Medical

## 2023-07-06 ENCOUNTER — Encounter: Payer: Self-pay | Admitting: Medical

## 2023-07-06 VITALS — BP 128/78 | HR 63 | Ht 73.0 in | Wt 215.0 lb

## 2023-07-06 DIAGNOSIS — Z789 Other specified health status: Secondary | ICD-10-CM

## 2023-07-06 DIAGNOSIS — R079 Chest pain, unspecified: Secondary | ICD-10-CM | POA: Diagnosis not present

## 2023-07-06 DIAGNOSIS — E782 Mixed hyperlipidemia: Secondary | ICD-10-CM

## 2023-07-06 DIAGNOSIS — I739 Peripheral vascular disease, unspecified: Secondary | ICD-10-CM

## 2023-07-06 DIAGNOSIS — I471 Supraventricular tachycardia, unspecified: Secondary | ICD-10-CM

## 2023-07-06 DIAGNOSIS — Z720 Tobacco use: Secondary | ICD-10-CM

## 2023-07-06 DIAGNOSIS — R0602 Shortness of breath: Secondary | ICD-10-CM | POA: Diagnosis not present

## 2023-07-06 NOTE — Progress Notes (Signed)
Cardiology Office Note:    Date:  07/06/2023   ID:  Jason Livingston, DOB 09/25/1965, MRN [id number]  PCP:  Jason Hay, DO  [facility] Cardiologist:  None  [facility] Electrophysiologist:  None   Referring MD: Jason Hay, DO   Chief Complaint: Overdue follow-up  History of Present Illness:    Jason Livingston is a 57 y.o. male with a hx of pSVT, HTN, HLD, prediabetes, tobacco use, alcohol abuse, PAD who presents for follow-up.    He was seen in the ED September 2022 for palpitations due to SVT, treated with adenosine with minimal troponin elevation. He was started on metoprolol 25mg  daily. Echo was ordered which showed LVEF G1DD, mild MR, and mild MAE.    He was seen 06/16/21 and noted some improvement in symptoms of his palpitations. Metoprolol was increased to 50mg  daily. He also reported b/l claudication and ABIs, US aorta/IVC/iliac, lower extremity US were ordered, but not performed. Myoview stress test was ordered for chest pain and SOB, but this was not completed due to inability to not smoke for 24 hours.    The patient was last seen 12/2021 reporting palpitations and Toprol was increased.   Today, the patient is overall doing well. He was having issues with insurance which is why he couldn't come back to be seen earlier. He is not having any palpitations on higher dose of Toprol. He denies chest pain. He has chronic shortness of breath. He can have episodes of dizziness, SOB, and chest pain when he walks. He reports PAD which greatly limits his function. He is still smoking and is trying to quit. He is no longer drinking alcohol.   Past Medical History:  Diagnosis Date   Arthritis    Asthma    Elevated lipids 12/17/2020   Encounter to establish care 11/25/2020   GERD (gastroesophageal reflux disease)    Hypertension    Pre-diabetes    Sleep apnea    Type 2 diabetes mellitus without complication, without long-term current use of insulin (HCC) 03/27/2023     Past Surgical History:  Procedure Laterality Date   COLONOSCOPY N/A 03/09/2021   Procedure: COLONOSCOPY;  Surgeon: Wyline Mood, MD;  Location: [facility] ENDOSCOPY;  Service: Gastroenterology;  Laterality: N/A;   MANDIBLE FRACTURE SURGERY      Current Medications: Current Meds  Medication Sig   Accu-Chek Softclix Lancets lancets Use 1 each daily before breakfast.   albuterol (VENTOLIN HFA) 108 (90 Base) MCG/ACT inhaler Inhale 2 puffs into the lungs every 6 (six) hours as needed for wheezing or shortness of breath.   aspirin (ASPIRIN ADULT LOW STRENGTH) 81 MG EC tablet Take 1 tablet (81 mg total) by mouth once daily. Swallow whole.   Blood Glucose Monitoring Suppl (BLOOD GLUCOSE MONITOR SYSTEM) w/Device KIT Use as directed   chlorhexidine (PERIDEX) 0.12 % solution Use as directed 15 mLs in the mouth or throat 2 (two) times daily. Start on last day of antibiotic and take through appointment.   cyanocobalamin (VITAMIN B12) 1000 MCG tablet Take 1 tablet (1,000 mcg total) by mouth daily.   diclofenac Sodium (VOLTAREN) 1 % GEL Apply 2 g topically 4 (four) times daily.   fluticasone furoate-vilanterol (BREO ELLIPTA) 200-25 MCG/ACT AEPB Inhale 1 puff into the lungs once daily.   Glucose Blood (BLOOD GLUCOSE TEST STRIPS) STRP Use daily before breakfast   ibuprofen (ADVIL) 200 MG tablet Take 800 mg by mouth as needed for headache.   losartan (COZAAR) 100 MG tablet Take 1  tablet (100 mg total) by mouth daily.   metFORMIN (GLUCOPHAGE-XR) 500 MG 24 hr tablet Take 2 tablets (1,000 mg total) by mouth once daily with breakfast.   metoprolol succinate (TOPROL XL) 100 MG 24 hr tablet Take 1 tablet (100 mg total) by mouth once daily.   metoprolol tartrate (LOPRESSOR) 25 MG tablet Take 1 tablet (25 mg total) by mouth once daily as needed. (For elevated heart rates or SVT).   omeprazole (PRILOSEC) 20 MG capsule Take 1 capsule (20 mg total) by mouth once daily.   rosuvastatin (CRESTOR) 40 MG tablet Take 1 tablet  (40 mg total) by mouth daily.   thiamine (VITAMIN B-1) 100 MG tablet Take 1 tablet (100 mg total) by mouth once daily.     Allergies:   Patient has no known allergies.   Social History   Socioeconomic History   Marital status: Single    Spouse name: Not on file   Number of children: Not on file   Years of education: Not on file   Highest education level: Associate degree: occupational, Scientist, product/process development, or vocational program  Occupational History   Not on file  Tobacco Use   Smoking status: Every Day    Current packs/day: 0.50    Average packs/day: 0.5 packs/day for 37.0 years (18.5 ttl pk-yrs)    Types: Cigarettes   Smokeless tobacco: Former    Quit date: 2012  Vaping Use   Vaping status: Never Used  Substance and Sexual Activity   Alcohol use: Yes    Alcohol/week: 14.0 standard drinks of alcohol    Types: 14 Cans of beer per week    Comment: last use end of March 2024 "1 beer per day"; hx of 2 40oz beers at night to sleep, 02/18/21 20oz with dinner everynight   Drug use: Not Currently    Types: Cocaine, Marijuana, LSD    Comment: used in his 20's, has not used in 30 years   Sexual activity: Not Currently  Other Topics Concern   Not on file  Social History Narrative   Not on file   Social Determinants of Health   Financial Resource Strain: High Risk (03/27/2023)   Overall Financial Resource Strain (CARDIA)    Difficulty of Paying Living Expenses: Very hard  Food Insecurity: Food Insecurity Present (05/30/2023)   Hunger Vital Sign    Worried About Running Out of Food in the Last Year: Sometimes true    Ran Out of Food in the Last Year: Sometimes true  Transportation Needs: Unmet Transportation Needs (05/30/2023)   PRAPARE - Administrator, Civil Service (Medical): Yes    Lack of Transportation (Non-Medical): No  Physical Activity: Unknown (03/27/2023)   Exercise Vital Sign    Days of Exercise per Week: 0 days    Minutes of Exercise per Session: Not on file   Stress: Stress Concern Present (03/27/2023)   [employer] of Occupational Health - Occupational Stress Questionnaire    Feeling of Stress : To some extent  Social Connections: Moderately Integrated (03/27/2023)   Social Connection and Isolation Panel [NHANES]    Frequency of Communication with Friends and Family: Twice a week    Frequency of Social Gatherings with Friends and Family: Twice a week    Attends Religious Services: More than 4 times per year    Active Member of [employer] or Organizations: Yes    Attends Engineer, structural: More than 4 times per year    Marital Status: Never married  Family History: The patient's family history includes Heart attack (age of onset: 36) in his maternal grandfather; Heart disease in his maternal grandfather; Hypertension in his maternal grandmother; Other in his cousin, father, and mother.  ROS:   Please see the history of present illness.     All other systems reviewed and are negative.  EKGs/Labs/Other Studies Reviewed:    The following studies were reviewed today:  Cardiac CTA 01/2022 IMPRESSION: 1. Coronary calcium score of 159. This was 87th percentile for age and sex matched control.   2. Normal coronary origin with right dominance.   3. Mild proximal LAD stenosis (25%).   4. Minimal proximal RCA stenosis (<25%).   5. CAD-RADS 2. Mild non-obstructive CAD (25-49%). Consider non-atherosclerotic causes of chest pain. Consider preventive therapy and risk factor modification.   Electronically Signed: By: Debbe Odea M.D. On: 01/13/2022 15:48    EKG:  EKG is ordered today.  The ekg ordered today demonstrates NSR 63bpm, nonspecific T wave changes  Recent Labs: 01/05/2023: Hemoglobin 14.4; Platelets 309 05/10/2023: ALT 15; BUN 10; Creatinine, Ser 0.87; Potassium 4.6; Sodium 139  Recent Lipid Panel    Component Value Date/Time   CHOL 207 (H) 05/10/2023 1632   TRIG 280 (H) 05/10/2023 1632   HDL 34 (L)  05/10/2023 1632   CHOLHDL 6.1 (H) 05/10/2023 1632   LDLCALC 123 (H) 05/10/2023 1632    Physical Exam:    VS:  BP 128/78 (BP Location: Left Arm, Patient Position: Sitting, Cuff Size: Normal)   Pulse 63   Ht 6\' 1"  (1.854 m)   Wt 215 lb (97.5 kg)   SpO2 98%   BMI 28.37 kg/m     Wt Readings from Last 3 Encounters:  07/06/23 215 lb (97.5 kg)  06/26/23 215 lb 6.4 oz (97.7 kg)  06/05/23 213 lb (96.6 kg)     GEN:  Well nourished, well developed in no acute distress HEENT: Normal NECK: No JVD; No carotid bruits LYMPHATICS: No lymphadenopathy CARDIAC: RRR, no murmurs, rubs, gallops; distant pulse in the lower extremities RESPIRATORY:  Clear to auscultation without rales, wheezing or rhonchi  ABDOMEN: Soft, non-tender, non-distended MUSCULOSKELETAL:  No edema; No deformity  SKIN: Warm and dry NEUROLOGIC:  Alert and oriented x 3 PSYCHIATRIC:  Normal affect   ASSESSMENT:    1. SVT (supraventricular tachycardia) (HCC)   2. Shortness of breath   3. Chest pain, unspecified type   4. PAD (peripheral artery disease) (HCC)   5. Hyperlipidemia, mixed   6. Tobacco abuse   7. Alcohol use    PLAN:    In order of problems listed above:  pSVT He denies palpitations or heart racing. Symptoms controlled on Toprol 100mg  daily.  He has Lopressor 25 Rams to use as needed for breakthrough palpitations.  Chest pain and SOB Non-obstructive CAD Patient reports exertional chest pain and shortness of breath, however function is very limited due to PAD.  Cardiac CTA in 2023 showed mild nonobstructive CAD, which is overall very reassuring. He is euvolemic on exam.  EKG today is nonischemic.  I will check an echocardiogram.  Continue aspirin 81 mg daily, Toprol 100 mg daily, Crestor 40 mg daily  PAD Recent ABIs showed right ABI normal, right TBI normal, left ABI with mild left lower extremity arterial disease, left TBI normal.  Vascular is planning on lower extremity angiography with possible  stenting. Continue Crestor and Aspirin.  HLD LDL 123, HDL 34, Total chol 207, TG 280. Patient reports he was not  consistently taking his statin. Continue Crestor 40mg  daily. We can re-check lipids at follow-up.   Tobacco use He is trying to quit smoking.  Alcohol use He quit 4 months ago.  Disposition: Follow up in 3 month(s) with MD/APP    Signed, Heidy Mccubbin David Stall, PA-C  07/06/2023 2:45 PM    [facility]

## 2023-07-06 NOTE — Patient Instructions (Signed)
Medication Instructions:  No changes at this time.   *If you need a refill on your cardiac medications before your next appointment, please call your pharmacy*   Lab Work: None  If you have labs (blood work) drawn today and your tests are completely normal, you will receive your results only by: MyChart Message (if you have MyChart) OR A paper copy in the mail If you have any lab test that is abnormal or we need to change your treatment, we will call you to review the results.   Testing/Procedures: Your physician has requested that you have an echocardiogram. Echocardiography is a painless test that uses sound waves to create images of your heart. It provides your doctor with information about the size and shape of your heart and how well your heart's chambers and valves are working. This procedure takes approximately one hour. There are no restrictions for this procedure. Please do NOT wear cologne, perfume, aftershave, or lotions (deodorant is allowed). Please arrive 15 minutes prior to your appointment time.  Please note: We ask at that you not bring children with you during ultrasound (echo/ vascular) testing. Due to room size and safety concerns, children are not allowed in the ultrasound rooms during exams. Our front office staff cannot provide observation of children in our lobby area while testing is being conducted. An adult accompanying a patient to their appointment will only be allowed in the ultrasound room at the discretion of the ultrasound technician under special circumstances. We apologize for any inconvenience.    Follow-Up: At [facility], you and your health needs are our priority.  As part of our continuing mission to provide you with exceptional heart care, we have created designated Provider Care Teams.  These Care Teams include your primary Cardiologist (physician) and Advanced Practice Providers (APPs -  Physician Assistants and Nurse Practitioners) who  all work together to provide you with the care you need, when you need it.   Your next appointment:   3 month(s)  Provider:   Terrilee Croak, PA-C

## 2023-07-10 ENCOUNTER — Telehealth (INDEPENDENT_AMBULATORY_CARE_PROVIDER_SITE_OTHER): Payer: Self-pay

## 2023-07-10 NOTE — Telephone Encounter (Signed)
Spoke with the patient and he is scheduled with Dr. Gilda Crease for a left leg angio on 07/25/23 with a 9:00 am arrival time to the [facility]. Pre-procedure instructions were discussed and will be sent to Mychart and mailed.

## 2023-07-11 ENCOUNTER — Ambulatory Visit: Payer: Medicaid Other | Admitting: Dietician

## 2023-07-11 NOTE — Congregational Nurse Program (Signed)
  Dept: [phone]   Congregational Nurse Program Note  Date of Encounter: 07/11/2023 Client to [facility] day center for assistance in making a dental appointment. Appointment made at [facility] in [address] For 1/7 at 9 am. This will be for extractions only. Client will need a regular dentist for scheduled cleanings and check ups. Numbers given for [facility] and [facility]. Client will make his own appointment. Client also reports he has vascular surgery scheduled for 12/7. He does plan to continue coming regularly to the center on Tuesdays and will have a "standing appointment" with this RN. Francesco Runner BSN, RN Past Medical History: Past Medical History:  Diagnosis Date   Arthritis    Asthma    Elevated lipids 12/17/2020   Encounter to establish care 11/25/2020   GERD (gastroesophageal reflux disease)    Hypertension    Pre-diabetes    Sleep apnea    Type 2 diabetes mellitus without complication, without long-term current use of insulin (HCC) 03/27/2023    Encounter Details:  Community Questionnaire - 07/11/23 1030       Questionnaire   Ask client: Do you give verbal consent for me to treat you today? Yes    Student Assistance N/A    Location Patient Served  [facility]    Encounter Setting CN site    Population Status Unknown   client does ave housing through a Scientist, research (life sciences)   Insurance Medicaid   [place]   Insurance/Financial Assistance Referral N/A    Medication N/A   client is able to obtain medications precribed  from [facility] community [place]   Medical Provider Yes   [facility], Jacquenette Shone MD   Screening Referrals Made N/A   Client has a chest CT scheduled at 2:30 today 9/24   Medical Referrals Made Dental   RN attempted to make a dental apt for client, called 3 no new patient apts avail. until Feb 2024   Medical Appointment Completed N/A    CNP Interventions  Advocate/Support;Educate;Navigate Healthcare System   assisted client with some [employer] insurance questions and concerns. Contacted medicaid direct to find dental providers in this area   Screenings CN Performed N/A    ED Visit Averted N/A    Life-Saving Intervention Made N/A

## 2023-07-25 ENCOUNTER — Encounter
Admission: RE | Disposition: A | Discharge status: Home / Self Care | Payer: Self-pay | Source: Ambulatory Visit | Attending: Vascular Surgery

## 2023-07-25 ENCOUNTER — Other Ambulatory Visit: Payer: Self-pay

## 2023-07-25 ENCOUNTER — Ambulatory Visit
Admission: RE | Admit: 2023-07-25 | Discharge: 2023-07-25 | Disposition: A | Discharge status: Home / Self Care | Payer: Medicaid Other | Source: Ambulatory Visit | Attending: Vascular Surgery | Admitting: Vascular Surgery

## 2023-07-25 ENCOUNTER — Encounter: Payer: Self-pay | Admitting: Vascular Surgery

## 2023-07-25 DIAGNOSIS — Z8249 Family history of ischemic heart disease and other diseases of the circulatory system: Secondary | ICD-10-CM | POA: Diagnosis not present

## 2023-07-25 DIAGNOSIS — E782 Mixed hyperlipidemia: Secondary | ICD-10-CM | POA: Insufficient documentation

## 2023-07-25 DIAGNOSIS — I70223 Atherosclerosis of native arteries of extremities with rest pain, bilateral legs: Secondary | ICD-10-CM

## 2023-07-25 DIAGNOSIS — I471 Supraventricular tachycardia, unspecified: Secondary | ICD-10-CM | POA: Insufficient documentation

## 2023-07-25 DIAGNOSIS — F17210 Nicotine dependence, cigarettes, uncomplicated: Secondary | ICD-10-CM | POA: Diagnosis not present

## 2023-07-25 DIAGNOSIS — Z7901 Long term (current) use of anticoagulants: Secondary | ICD-10-CM | POA: Diagnosis not present

## 2023-07-25 DIAGNOSIS — I70229 Atherosclerosis of native arteries of extremities with rest pain, unspecified extremity: Secondary | ICD-10-CM

## 2023-07-25 DIAGNOSIS — J4540 Moderate persistent asthma, uncomplicated: Secondary | ICD-10-CM | POA: Diagnosis not present

## 2023-07-25 DIAGNOSIS — I10 Essential (primary) hypertension: Secondary | ICD-10-CM | POA: Diagnosis not present

## 2023-07-25 HISTORY — PX: LOWER EXTREMITY ANGIOGRAPHY: CATH118251

## 2023-07-25 LAB — CREATININE, SERUM
Creatinine, Ser: 0.91 mg/dL (ref 0.61–1.24)
GFR, Estimated: >60 mL/min (ref >60)

## 2023-07-25 LAB — BUN: BUN: 19 mg/dL (ref 6–20)

## 2023-07-25 LAB — GLUCOSE, CAPILLARY: Glucose-Capillary: 118 mg/dL — ABNORMAL HIGH (ref 70–99)

## 2023-07-25 SURGERY — LOWER EXTREMITY ANGIOGRAPHY
Anesthesia: Moderate Sedation | Site: Leg Lower | Laterality: Left

## 2023-07-25 MED ORDER — ACETAMINOPHEN 325 MG PO TABS: 650.0000 mg | ORAL_TABLET | ORAL | Status: DC | PRN

## 2023-07-25 MED ORDER — MIDAZOLAM HCL 5 MG/5ML IJ SOLN
INTRAMUSCULAR | Status: AC
Start: 2023-07-25 — End: ?
  Filled 2023-07-25: qty 5

## 2023-07-25 MED ORDER — FENTANYL CITRATE (PF) 100 MCG/2ML IJ SOLN
INTRAMUSCULAR | Status: AC
  Filled 2023-07-25: qty 2

## 2023-07-25 MED ORDER — MIDAZOLAM HCL 2 MG/ML PO SYRP: 8.0000 mg | ORAL_SOLUTION | Freq: Once | ORAL | Status: DC | PRN

## 2023-07-25 MED ORDER — HYDROMORPHONE HCL 1 MG/ML IJ SOLN: 1.0000 mg | Freq: Once | INTRAMUSCULAR | Status: DC | PRN

## 2023-07-25 MED ORDER — SODIUM CHLORIDE 0.9 % IV SOLN: INTRAVENOUS | Status: DC

## 2023-07-25 MED ORDER — SODIUM CHLORIDE 0.9 % IV SOLN: 250.0000 mL | INTRAVENOUS | Status: DC | PRN

## 2023-07-25 MED ORDER — IODIXANOL 320 MG/ML IV SOLN
INTRAVENOUS | Status: DC | PRN
  Administered 2023-07-25: 45 mL via INTRA_ARTERIAL

## 2023-07-25 MED ORDER — SODIUM CHLORIDE 0.9 % IV SOLN
INTRAVENOUS | Status: DC
Start: 2023-07-25 — End: 2023-07-25

## 2023-07-25 MED ORDER — SODIUM CHLORIDE 0.9% FLUSH: 3.0000 mL | INTRAVENOUS | Status: DC | PRN

## 2023-07-25 MED ORDER — FAMOTIDINE 20 MG PO TABS: 40.0000 mg | ORAL_TABLET | Freq: Once | ORAL | Status: DC | PRN

## 2023-07-25 MED ORDER — HYDRALAZINE HCL 20 MG/ML IJ SOLN: 5.0000 mg | INTRAMUSCULAR | Status: DC | PRN

## 2023-07-25 MED ORDER — DIPHENHYDRAMINE HCL 50 MG/ML IJ SOLN: 50.0000 mg | Freq: Once | INTRAMUSCULAR | Status: DC | PRN

## 2023-07-25 MED ORDER — METHYLPREDNISOLONE SODIUM SUCC 125 MG IJ SOLR: 125.0000 mg | Freq: Once | INTRAMUSCULAR | Status: DC | PRN

## 2023-07-25 MED ORDER — HEPARIN (PORCINE) IN NACL 1000-0.9 UT/500ML-% IV SOLN
INTRAVENOUS | Status: DC | PRN
  Administered 2023-07-25: 1000 mL

## 2023-07-25 MED ORDER — FENTANYL CITRATE (PF) 100 MCG/2ML IJ SOLN
INTRAMUSCULAR | Status: DC | PRN
  Administered 2023-07-25: 50 ug via INTRAVENOUS
  Administered 2023-07-25 (×2): 25 ug via INTRAVENOUS

## 2023-07-25 MED ORDER — SODIUM CHLORIDE 0.9% FLUSH: 3.0000 mL | Freq: Two times a day (BID) | INTRAVENOUS | Status: DC

## 2023-07-25 MED ORDER — CEFAZOLIN SODIUM-DEXTROSE 2-4 GM/100ML-% IV SOLN
INTRAVENOUS | Status: AC
  Filled 2023-07-25: qty 100

## 2023-07-25 MED ORDER — OXYCODONE HCL 5 MG PO TABS: 5.0000 mg | ORAL_TABLET | ORAL | Status: DC | PRN

## 2023-07-25 MED ORDER — LIDOCAINE HCL (PF) 1 % IJ SOLN
INTRAMUSCULAR | Status: DC | PRN
  Administered 2023-07-25: 10 mL via INTRADERMAL

## 2023-07-25 MED ORDER — LABETALOL HCL 5 MG/ML IV SOLN: 10.0000 mg | INTRAVENOUS | Status: DC | PRN

## 2023-07-25 MED ORDER — MIDAZOLAM HCL 2 MG/2ML IJ SOLN
INTRAMUSCULAR | Status: DC | PRN
  Administered 2023-07-25: 2 mg via INTRAVENOUS
  Administered 2023-07-25 (×2): 1 mg via INTRAVENOUS

## 2023-07-25 MED ORDER — ONDANSETRON HCL 4 MG/2ML IJ SOLN: 4.0000 mg | Freq: Four times a day (QID) | INTRAMUSCULAR | Status: DC | PRN

## 2023-07-25 MED ORDER — HEPARIN SODIUM (PORCINE) 1000 UNIT/ML IJ SOLN
INTRAMUSCULAR | Status: AC
Start: 2023-07-25 — End: ?
  Filled 2023-07-25: qty 10

## 2023-07-25 MED ORDER — CEFAZOLIN SODIUM-DEXTROSE 2-4 GM/100ML-% IV SOLN
2.0000 g | INTRAVENOUS | Status: AC
  Administered 2023-07-25: 2 g via INTRAVENOUS

## 2023-07-25 SURGICAL SUPPLY — 11 items
CATH OMNI FLUSH 5F 65CM (CATHETERS) IMPLANT
COVER PROBE ULTRASOUND 5X96 (MISCELLANEOUS) IMPLANT
DEVICE STARCLOSE SE CLOSURE (Vascular Products) IMPLANT
GOWN STRL REUS W/ TWL LRG LVL3 (GOWN DISPOSABLE) ×2 IMPLANT
NDL ENTRY 21GA 7CM ECHOTIP (NEEDLE) IMPLANT
NEEDLE ENTRY 21GA 7CM ECHOTIP (NEEDLE) ×1
PACK ANGIOGRAPHY (CUSTOM PROCEDURE TRAY) ×2 IMPLANT
SET INTRO CAPELLA COAXIAL (SET/KITS/TRAYS/PACK) IMPLANT
SHEATH BRITE TIP 5FRX11 (SHEATH) IMPLANT
TUBING CONTRAST HIGH PRESS 72 (TUBING) IMPLANT
WIRE GUIDERIGHT .035X150 (WIRE) IMPLANT

## 2023-07-25 NOTE — Op Note (Signed)
[facility]  Percutaneous Study/Intervention Procedural Note   Date of Surgery: 07/25/2023,12:04 PM  Surgeon:Leona Alen, Latina Craver   Pre-operative Diagnosis: Atherosclerotic occlusive disease bilateral lower extremities with rest pain  Post-operative diagnosis:  Same  Procedure(s) Performed:  1.  Abdominal aortogram  2.  Bilateral lower extremity distal runoff  3.  Ultrasound-guided access to the left common femoral artery  4.  StarClose left femoral artery    Anesthesia: Conscious sedation was administered by the interventional radiology RN under my direct supervision. IV Versed plus fentanyl were utilized. Continuous ECG, pulse oximetry and blood pressure was monitored throughout the entire procedure.  Conscious sedation was administered for a total of 25 minutes.  Sheath: 5 French 11 cm Pinnacle sheath retrograde left common femoral  Contrast: 45 cc   Fluoroscopy Time: 1.3 minutes  Indications:  The patient presents to [facility] with atherosclerotic occlusive disease bilateral lower extremities with increasing pain of his lower extremities and feet.  He is unable to drive and so has to walk everywhere including to get his groceries and he is now finding it virtually impossible to make it to the store.  Noninvasive studies as well as physical exam demonstrate significant atherosclerotic occlusive disease.  Pedal pulses are nonpalpable bilaterally suggesting hemodynamically significant atherosclerotic occlusive disease.  The risks and benefits as well as alternative therapies for lower extremity revascularization are reviewed with the patient all questions are answered the patient agrees to proceed.  The patient is therefore undergoing angiography with the hope for intervention for limb salvage.   Procedure:  Waco Gorski a 57 y.o. male who was identified and appropriate procedural time out was performed.  The patient was then placed supine on the  table and prepped and draped in the usual sterile fashion.  Ultrasound was used to evaluate the right common femoral artery.  The right side demonstrates a large amount of calcific plaque it is nonpulsatile suggesting it is occluded.  After puncture with a microneedle and an area that appeared more echolucent there was no blood return.  At this point I elected to access the left groin so that I would be able to obtain a diagnostic image.  Similarly the left common femoral demonstrated bulky plaque however more proximally near the circumflex vessels that appeared to be pulsatile.  Under direct ultrasound visualization I was able to access the proximal left common femoral with a microneedle.  An ultrasound image was acquired for the permanent record.  A micropuncture needle was used to access the left common femoral artery under direct ultrasound guidance.  The microwire was then advanced under fluoroscopic guidance without difficulty followed by the micro-sheath.  A 0.035 J wire was advanced without resistance and a 5Fr sheath was placed.    Pigtail catheter was then advanced to the level of T12 and AP projection of the aorta was obtained. Pigtail catheter was then repositioned to above the bifurcation and bilateral oblique view of the pelvis was obtained.  Detector was returned to the AP projection and bilateral runoff was obtained.  After review of the images the catheter was removed over wire and an LAO view of the groin was obtained. StarClose device was deployed without difficulty.   Findings:   Aortogram: The abdominal aorta is opacified with a bolus injection contrast.  Single renal arteries are noted bilaterally with normal nephrograms.  No evidence of hemodynamically significant renal artery stenosis.  There are no hemodynamically significant stenoses identified within the aorta.  The aortic bifurcation  is mildly diseased but widely patent.  Bilateral common iliac arteries as well as the internal  iliac arteries are patent without hemodynamically significant stenosis.  The right external iliac artery is occluded from its origin through the distal common femoral the left is severely diseased with greater than 80 to 90% stenosis throughout its course again ending in a occluded common femoral.    Bilateral lower Extremity: The bilateral common femoral arteries are occluded on the right in its entirety down to the femoral bifurcation and on the left just below the circumflex again through the bifurcation.  The profunda femoris and superficial femoral and popliteal arteries demonstrate mild atherosclerotic changes but there are no hemodynamically significant lesions.  Trifurcation appears patent.     SUMMARY: Based on these images no intervention is performed at this time.  The patient will require bilateral common femoral endarterectomies.  At that time stenting of the left external iliac artery will be performed and attempts at stenting the right external iliac artery will be made.  If I am unable to achieve recanalization of the right external iliac artery then a femoral-femoral bypass will be placed.    Disposition: Patient was taken to the recovery room in stable condition having tolerated the procedure well.  Earl Lites Hazel Leveille 07/25/2023,12:04 PM

## 2023-07-25 NOTE — Interval H&P Note (Signed)
History and Physical Interval Note:  07/25/2023 11:18 AM  Jason Livingston  has presented today for surgery, with the diagnosis of LLE Angio   ASO w rest pain.  The various methods of treatment have been discussed with the patient and family. After consideration of risks, benefits and other options for treatment, the patient has consented to  Procedure(s): Lower Extremity Angiography (Left) as a surgical intervention.  The patient's history has been reviewed, patient examined, no change in status, stable for surgery.  I have reviewed the patient's chart and labs.  Questions were answered to the patient's satisfaction.     Levora Dredge

## 2023-07-30 NOTE — Progress Notes (Unsigned)
MRN : [id number]  Jason Livingston is a 57 y.o. (Aug 30, 1965) male who presents with chief complaint of check circulation.  History of Present Illness:   The patient returns to the office for followup and review status post angiogram with intervention on 07/25/2023.   Procedure: Abdominal aortogram Bilateral lower extremity distal runoff  The patient will require bilateral common femoral endarterectomies. At that time stenting of the left external iliac artery will be performed and attempts at stenting the right external iliac artery will be made. If I am unable to achieve recanalization of the right external iliac artery then a femoral-femoral bypass will be placed.   The patient notes no change in the lower extremity symptoms. No interval improvement of the patient's claudication distance and his rest pain symptoms continue.  He actually feels they are worse particularly his left foot.  No new ulcers or wounds have occurred since the last visit.  There have been no significant changes to the patient's overall health care.  No documented history of amaurosis fugax or recent TIA symptoms. There are no recent neurological changes noted. No documented history of DVT, PE or superficial thrombophlebitis. The patient denies recent episodes of angina or shortness of breath.    No outpatient medications have been marked as taking for the 07/31/23 encounter (Appointment) with Gilda Crease, Latina Craver, MD.    Past Medical History:  Diagnosis Date   Arthritis    Asthma    Elevated lipids 12/17/2020   Encounter to establish care 11/25/2020   GERD (gastroesophageal reflux disease)    Hypertension    Pre-diabetes    Sleep apnea    Type 2 diabetes mellitus without complication, without long-term current use of insulin (HCC) 03/27/2023    Past Surgical History:  Procedure Laterality Date   COLONOSCOPY N/A 03/09/2021    Procedure: COLONOSCOPY;  Surgeon: Wyline Mood, MD;  Location: [facility] ENDOSCOPY;  Service: Gastroenterology;  Laterality: N/A;   LOWER EXTREMITY ANGIOGRAPHY Left 07/25/2023   Procedure: Lower Extremity Angiography;  Surgeon: Renford Dills, MD;  Location: [facility] INVASIVE CV LAB;  Service: Cardiovascular;  Laterality: Left;   MANDIBLE FRACTURE SURGERY      Social History Social History   Tobacco Use   Smoking status: Every Day    Current packs/day: 0.50    Average packs/day: 0.5 packs/day for 37.0 years (18.5 ttl pk-yrs)    Types: Cigarettes   Smokeless tobacco: Former    Quit date: 2012  Vaping Use   Vaping status: Never Used  Substance Use Topics   Alcohol use: Yes    Alcohol/week: 14.0 standard drinks of alcohol    Types: 14 Cans of beer per week    Comment: last use end of March 2024 "1 beer per day"; hx of 2 40oz beers at night to sleep, 02/18/21 20oz with dinner everynight   Drug use: Not Currently    Types: Cocaine, Marijuana, LSD    Comment: used in his 20's, has not used in 30 years    Family History Family History  Problem Relation Age of Onset   Other Mother  unknown medical history   Other Father        unknwon medical history   Hypertension Maternal Grandmother    Heart attack Maternal Grandfather 20   Heart disease Maternal Grandfather    Other Cousin        kidney transplant    No Known Allergies   REVIEW OF SYSTEMS (Negative unless checked)  Constitutional: [] Weight loss  [] Fever  [] Chills Cardiac: [] Chest pain   [] Chest pressure   [] Palpitations   [] Shortness of breath when laying flat   [] Shortness of breath with exertion. Vascular:  [x] Pain in legs with walking   [] Pain in legs at rest  [] History of DVT   [] Phlebitis   [] Swelling in legs   [] Varicose veins   [] Non-healing ulcers Pulmonary:   [] Uses home oxygen   [] Productive cough   [] Hemoptysis   [] Wheeze  [] COPD   [] Asthma Neurologic:  [] Dizziness   [] Seizures   [] History of stroke   [] History  of TIA  [] Aphasia   [] Vissual changes   [] Weakness or numbness in arm   [] Weakness or numbness in leg Musculoskeletal:   [] Joint swelling   [] Joint pain   [] Low back pain Hematologic:  [] Easy bruising  [] Easy bleeding   [] Hypercoagulable state   [] Anemic Gastrointestinal:  [] Diarrhea   [] Vomiting  [] Gastroesophageal reflux/heartburn   [] Difficulty swallowing. Genitourinary:  [] Chronic kidney disease   [] Difficult urination  [] Frequent urination   [] Blood in urine Skin:  [] Rashes   [] Ulcers  Psychological:  [] History of anxiety   []  History of major depression.  Physical Examination  There were no vitals filed for this visit. There is no height or weight on file to calculate BMI. Gen: WD/WN, NAD Head: [place]/AT, No temporalis wasting.  Ear/Nose/Throat: Hearing grossly intact, nares w/o erythema or drainage Eyes: PER, EOMI, sclera nonicteric.  Neck: Supple, no masses.  No bruit or JVD.  Pulmonary:  Good air movement, no audible wheezing, no use of accessory muscles.  Cardiac: RRR, normal S1, S2, no Murmurs. Vascular:  mild trophic changes, no open wounds Vessel Right Left  Radial Palpable Palpable  PT Not Palpable Not Palpable  DP Not Palpable Not Palpable  Gastrointestinal: soft, non-distended. No guarding/no peritoneal signs.  Musculoskeletal: M/S 5/5 throughout.  No visible deformity.  Neurologic: CN 2-12 intact. Pain and light touch intact in extremities.  Symmetrical.  Speech is fluent. Motor exam as listed above. Psychiatric: Judgment intact, Mood & affect appropriate for pt's clinical situation. Dermatologic: No rashes or ulcers noted.  No changes consistent with cellulitis.   CBC Lab Results  Component Value Date   WBC 6.2 01/05/2023   HGB 14.4 01/05/2023   HCT 41.7 01/05/2023   MCV 98 (H) 01/05/2023   PLT 309 01/05/2023    BMET    Component Value Date/Time   NA 139 05/10/2023 1632   K 4.6 05/10/2023 1632   CL 102 05/10/2023 1632   CO2 23 05/10/2023 1632   GLUCOSE 89  05/10/2023 1632   GLUCOSE 109 (H) 12/27/2021 1638   BUN 19 07/25/2023 0937   BUN 10 05/10/2023 1632   CREATININE 0.91 07/25/2023 0937   CALCIUM 9.7 05/10/2023 1632   GFRNONAA >60 07/25/2023 0937   Estimated Creatinine Clearance: 113 mL/min (by C-G formula based on SCr of 0.91 mg/dL).  COAG No results found for: "INR", "PROTIME"  Radiology PERIPHERAL VASCULAR CATHETERIZATION Result Date: 07/25/2023 See surgical note for result.    Assessment/Plan 1. Atherosclerosis of native artery of left lower extremity with rest pain (  HCC) (Primary)  Recommend:  The patient has evidence of severe atherosclerotic changes of both lower extremities associated with ulceration and tissue loss of both feet.  This represents a limb threatening ischemia and places the patient at a high risk for limb loss.  Angiography has been performed and the situation is not ideal for intervention.  The images were actually pulled up on the computer and reviewed with the patient.  Given this finding open surgical repair is recommended.   Patient should undergo arterial reconstruction, endarterectomy of the bilateral common femoral arteries in association with iliac artery stenting of the bilateral lower extremities with the hope for limb salvage.  The risks and benefits as well as the alternative therapies was discussed in detail with the patient.  All questions were answered.  Patient agrees to proceed with open vascular surgical reconstruction.  The patient will follow up with me in the office after the procedure.   2. Essential hypertension Continue antihypertensive medications as already ordered, these medications have been reviewed and there are no changes at this time.  3. Moderate persistent asthma without complication Continue pulmonary medications and aerosols as already ordered, these medications have been reviewed and there are no changes at this time.   4. Type 2 diabetes mellitus without complication,  without long-term current use of insulin (HCC) Continue hypoglycemic medications as already ordered, these medications have been reviewed and there are no changes at this time.  Hgb A1C to be monitored as already arranged by primary service  5. Mixed hyperlipidemia Continue statin as ordered and reviewed, no changes at this time    Levora Dredge, MD  07/30/2023 11:37 AM

## 2023-07-31 ENCOUNTER — Other Ambulatory Visit: Payer: Self-pay

## 2023-07-31 ENCOUNTER — Telehealth: Payer: Self-pay | Admitting: Medical

## 2023-07-31 ENCOUNTER — Encounter: Payer: Self-pay | Admitting: Vascular Surgery

## 2023-07-31 ENCOUNTER — Telehealth (INDEPENDENT_AMBULATORY_CARE_PROVIDER_SITE_OTHER): Payer: Self-pay

## 2023-07-31 ENCOUNTER — Ambulatory Visit (INDEPENDENT_AMBULATORY_CARE_PROVIDER_SITE_OTHER): Payer: Medicaid Other | Admitting: Vascular Surgery

## 2023-07-31 VITALS — BP 126/80 | HR 71 | Resp 18 | Ht 73.0 in | Wt 219.2 lb

## 2023-07-31 DIAGNOSIS — I70222 Atherosclerosis of native arteries of extremities with rest pain, left leg: Secondary | ICD-10-CM

## 2023-07-31 DIAGNOSIS — E119 Type 2 diabetes mellitus without complications: Secondary | ICD-10-CM | POA: Diagnosis not present

## 2023-07-31 DIAGNOSIS — J4540 Moderate persistent asthma, uncomplicated: Secondary | ICD-10-CM | POA: Diagnosis not present

## 2023-07-31 DIAGNOSIS — E782 Mixed hyperlipidemia: Secondary | ICD-10-CM

## 2023-07-31 DIAGNOSIS — I10 Essential (primary) hypertension: Secondary | ICD-10-CM

## 2023-07-31 NOTE — Telephone Encounter (Signed)
Called Dr. Gilda Crease  office and left a message for them to make a clearance  and fax it to [place] [phone]. Pt needs pre-op appointment for a vascular surgery by Dr. Lorretta Harp for bilateral femoral endarterectomies. Can we also ask the surgeons office for official pre-op request to be sent.

## 2023-07-31 NOTE — Telephone Encounter (Signed)
[facility] called requesting a medical clearance letter for [employer]. They would like to have it faxed @ [phone].  Heart care message will be forwarded per your request.

## 2023-07-31 NOTE — Telephone Encounter (Signed)
Message forwarded Per Dr.Schnier request.

## 2023-08-01 ENCOUNTER — Ambulatory Visit: Payer: Medicaid Other | Admitting: Dietician

## 2023-08-03 ENCOUNTER — Ambulatory Visit: Payer: Medicaid Other | Attending: Medical

## 2023-08-03 ENCOUNTER — Encounter: Payer: Self-pay | Admitting: Dietician

## 2023-08-03 ENCOUNTER — Encounter (INDEPENDENT_AMBULATORY_CARE_PROVIDER_SITE_OTHER): Payer: Self-pay | Admitting: Vascular Surgery

## 2023-08-03 ENCOUNTER — Encounter: Payer: Medicaid Other | Attending: Family Medicine | Admitting: Dietician

## 2023-08-03 DIAGNOSIS — Z713 Dietary counseling and surveillance: Secondary | ICD-10-CM | POA: Insufficient documentation

## 2023-08-03 DIAGNOSIS — E119 Type 2 diabetes mellitus without complications: Secondary | ICD-10-CM | POA: Diagnosis present

## 2023-08-03 DIAGNOSIS — R0602 Shortness of breath: Secondary | ICD-10-CM

## 2023-08-03 NOTE — Progress Notes (Signed)
Diabetes Self-Management Education  Visit Type: First/Initial  Appt. Start Time: 1100 Appt. End Time: 1220  08/03/2023  [name], identified by name and date of birth, is a 57 y.o. male with a diagnosis of Diabetes: Type 2.   ASSESSMENT  There were no vitals taken for this visit. There is no height or weight on file to calculate BMI.  Pt reports elevated lipids (Cholesterol - 207, TGL - 280, LDL - 123, HDL - 34), and would also like to reduce cholesterol via diet/exercise. Pt reports recent attempted stent placement in their legs, but will need to have another procedure to finish unblocking arteries. Pt states they are unable to stand for long periods of time or work due to leg pain, states their income is low at this point and relies on food stamps/financial assistance. Pt reports taking metformin daily for DM, no GI side effects. Pt reports usually skipping breakfast, states their appetite is low in the morning. Pt reports quitting alcohol ~6 months ago, currently still smokes tobacco but wants to quit.  Pt reports moderate daily stress level, states they get annoyed/frustrated easily, but states it doesn't progress into anger.   Diabetes Self-Management Education - 08/03/23 1121       Visit Information   Visit Type First/Initial      Initial Visit   Diabetes Type Type 2    Date Diagnosed 05/10/2023    Are you currently following a meal plan? No    Are you taking your medications as prescribed? Yes      Health Coping   How would you rate your overall health? Fair      Psychosocial Assessment   Patient Belief/Attitude about Diabetes Motivated to manage diabetes    What is the hardest part about your diabetes right now, causing you the most concern, or is the most worrisome to you about your diabetes?   Taking/obtaining medications;Making healty food and beverage choices;Getting support / problem solving   Low SES   Self-care barriers Lack of transportation;Other  (comment)   Low SES   Self-management support Doctor's office    Other persons present Patient    Patient Concerns Nutrition/Meal planning;Healthy Lifestyle   Cholesterol   Special Needs None    Preferred Learning Style No preference indicated    Learning Readiness Ready    How often do you need to have someone help you when you read instructions, pamphlets, or other written materials from your doctor or pharmacy? 1 - Never    What is the last grade level you completed in school? 12th      Pre-Education Assessment   Patient understands the diabetes disease and treatment process. Needs Instruction    Patient understands incorporating nutritional management into lifestyle. Needs Instruction    Patient undertands incorporating physical activity into lifestyle. Needs Instruction    Patient understands using medications safely. Needs Instruction    Patient understands monitoring blood glucose, interpreting and using results Needs Instruction    Patient understands prevention, detection, and treatment of acute complications. Needs Instruction    Patient understands prevention, detection, and treatment of chronic complications. Needs Instruction    Patient understands how to develop strategies to address psychosocial issues. Needs Instruction    Patient understands how to develop strategies to promote health/change behavior. Needs Instruction      Complications   Last HgB A1C per patient/outside source 6.5 %   05/10/2023   How often do you check your blood sugar? 1-2 times/day  Fasting Blood glucose range (mg/dL) 25-956    Have you had a dilated eye exam in the past 12 months? Yes    Have you had a dental exam in the past 12 months? No    Are you checking your feet? Yes      Dietary Intake   Breakfast None    Lunch Roast beef, colby jack cheese sandwich, cheetos    Dinner [employer], potaotes, Futures trader) Water, coffe, KoolAid      Activity / Exercise   Activity /  Exercise Type ADL's    How many days per week do you exercise? 0    How many minutes per day do you exercise? 0    Total minutes per week of exercise 0      Patient Education   Previous Diabetes Education No    Disease Pathophysiology Factors that contribute to the development of diabetes;Explored patient's options for treatment of their diabetes    Healthy Eating Role of diet in the treatment of diabetes and the relationship between the three main macronutrients and blood glucose level;Plate Method    Being Active Role of exercise on diabetes management, blood pressure control and cardiac health.    Medications Reviewed patients medication for diabetes, action, purpose, timing of dose and side effects.    Monitoring Identified appropriate SMBG and/or A1C goals.    Chronic complications Lipid levels, blood glucose control and heart disease;Relationship between chronic complications and blood glucose control    Diabetes Stress and Support Role of stress on diabetes    Lifestyle and Health Coping Lifestyle issues that need to be addressed for better diabetes care;Review risk of smoking and offered smoking cessation      Individualized Goals (developed by patient)   Nutrition Follow meal plan discussed    Physical Activity Not Applicable    Medications take my medication as prescribed    Monitoring  Test my blood glucose as discussed    Problem Solving Eating Pattern      Post-Education Assessment   Patient understands the diabetes disease and treatment process. Comprehends key points    Patient understands incorporating nutritional management into lifestyle. Comprehends key points    Patient undertands incorporating physical activity into lifestyle. N/A   Unable to stand for more than a couple minutes   Patient understands using medications safely. Comphrehends key points    Patient understands monitoring blood glucose, interpreting and using results Comprehends key points    Patient  understands prevention, detection, and treatment of acute complications. Comprehends key points    Patient understands prevention, detection, and treatment of chronic complications. Comprehends key points    Patient understands how to develop strategies to address psychosocial issues. Comprehends key points    Patient understands how to develop strategies to promote health/change behavior. Comprehends key points      Outcomes   Expected Outcomes Demonstrated interest in learning but significant barriers to change    Future DMSE 2 months    Program Status Not Completed             Individualized Plan for Diabetes Self-Management Training:   Learning Objective:  Patient will have a greater understanding of diabetes self-management. Patient education plan is to attend individual and/or group sessions per assessed needs and concerns.   Plan:   Patient Instructions  Choose Crystal Light or any sugar free powdered drink mix to replace your Kool-Aid with!  Begin to recognize carbohydrates, proteins, and non-starchy vegetables in  your food choices!  Begin to build your meals using the proportions of the Balanced Plate. First, select your carb choice(s) for the meal. Make this 25% of your meal. Next, select your source of protein to pair with your carb choice(s). Make this another 25% of your meal. Choose LEAN proteins as often as possible! Finally, complete your meal with a variety of non-starchy vegetables. Make this the remaining 50% of your meal.  Check your blood sugar each morning before eating or drinking (fasting). Look for numbers between 70-130 mg/dL  Look into these websites for heart healthy and diabetic friendly recipes! [employer] www.https://www.dunlap.com/  Expected Outcomes:  Demonstrated interest in learning but significant barriers to change  Education material provided: My Plate and Snack sheet, Food Assistance Resources, Protein List, LDL Cholesterol  Lowering Therapy  If problems or questions, patient to contact team via:  Phone and Email  Future DSME appointment: 2 months

## 2023-08-03 NOTE — Patient Instructions (Addendum)
Choose Crystal Light or any sugar free powdered drink mix to replace your Kool-Aid with!  Begin to recognize carbohydrates, proteins, and non-starchy vegetables in your food choices!  Begin to build your meals using the proportions of the Balanced Plate. First, select your carb choice(s) for the meal. Make this 25% of your meal. Next, select your source of protein to pair with your carb choice(s). Make this another 25% of your meal. Choose LEAN proteins as often as possible! Finally, complete your meal with a variety of non-starchy vegetables. Make this the remaining 50% of your meal.  Check your blood sugar each morning before eating or drinking (fasting). Look for numbers between 70-130 mg/dL  Look into these websites for heart healthy and diabetic friendly recipes! [employer] www.https://www.dunlap.com/

## 2023-08-04 LAB — ECHOCARDIOGRAM COMPLETE: Area-P 1/2: 3.99 cm2

## 2023-08-04 NOTE — Telephone Encounter (Signed)
We were asked to provide cardiac clearance for bilateral femoral endarterectomies and iliac stent placements by vascular surgery.  Yesterday's echo shows normal LVEF and no significant valvular abnormalities.  Coronary CTA last year showed minimal CAD.  Jason Livingston chronic shortness of breath was stable at his last visit in November.  He was also without chest pain.  I think it is reasonable for him to proceed with planned vascular surgery without additional cardiac testing or intervention.  Based on ACS perioperative risk calculator, he is low to intermediate risk for a perioperative cardiac complication (~1%).  Yvonne Kendall, MD [facility]

## 2023-08-21 ENCOUNTER — Telehealth (INDEPENDENT_AMBULATORY_CARE_PROVIDER_SITE_OTHER): Payer: Self-pay

## 2023-08-21 NOTE — Telephone Encounter (Signed)
 Spoke with the patient and I am attempting to schedule the patient for bilateral femoral endarterectomy and iliac stent placement. In the process of getting a prior auth.

## 2023-08-24 NOTE — Telephone Encounter (Signed)
 Spoke with the patient and he is scheduled with Dr. Jama on 08/30/23, for a bilateral femoral endarterectomy and iliac stent placement at [facility]. Pre-op is on 08/29/23 at 2:00 pm at the [facility]. Pre-procedure instructions were discussed and will  be sent to Mychart and mailed.

## 2023-08-29 ENCOUNTER — Encounter
Admission: RE | Admit: 2023-08-29 | Discharge: 2023-08-29 | Disposition: A | Discharge status: Home / Self Care | Payer: Medicaid Other | Source: Ambulatory Visit | Attending: Vascular Surgery | Admitting: Vascular Surgery

## 2023-08-29 ENCOUNTER — Other Ambulatory Visit (INDEPENDENT_AMBULATORY_CARE_PROVIDER_SITE_OTHER): Payer: Self-pay | Admitting: Nurse Practitioner

## 2023-08-29 ENCOUNTER — Other Ambulatory Visit: Payer: Self-pay

## 2023-08-29 VITALS — BP 148/83 | HR 85 | Temp 98.2°F | Resp 18 | Ht 72.0 in | Wt 218.8 lb

## 2023-08-29 DIAGNOSIS — I70223 Atherosclerosis of native arteries of extremities with rest pain, bilateral legs: Secondary | ICD-10-CM | POA: Insufficient documentation

## 2023-08-29 DIAGNOSIS — Z01812 Encounter for preprocedural laboratory examination: Secondary | ICD-10-CM | POA: Insufficient documentation

## 2023-08-29 HISTORY — DX: Supraventricular tachycardia, unspecified: I47.10

## 2023-08-29 HISTORY — DX: Myoneural disorder, unspecified: G70.9

## 2023-08-29 HISTORY — DX: Dyspnea, unspecified: R06.00

## 2023-08-29 LAB — CBC WITH DIFFERENTIAL/PLATELET
Abs Immature Granulocytes: 0.02 10*3/uL (ref 0.00–0.07)
Basophils Absolute: 0 10*3/uL (ref 0.0–0.1)
Basophils Relative: 1 %
Eosinophils Absolute: 0.2 10*3/uL (ref 0.0–0.5)
Eosinophils Relative: 2 %
HCT: 39.3 % (ref 39.0–52.0)
Hemoglobin: 13.8 g/dL (ref 13.0–17.0)
Immature Granulocytes: 0 %
Lymphocytes Relative: 30 %
Lymphs Abs: 2.6 10*3/uL (ref 0.7–4.0)
MCH: 32.2 pg (ref 26.0–34.0)
MCHC: 35.1 g/dL (ref 30.0–36.0)
MCV: 91.6 fL (ref 80.0–100.0)
Monocytes Absolute: 0.4 10*3/uL (ref 0.1–1.0)
Monocytes Relative: 4 %
Neutro Abs: 5.4 10*3/uL (ref 1.7–7.7)
Neutrophils Relative %: 63 %
Platelets: 278 10*3/uL (ref 150–400)
RBC: 4.29 MIL/uL (ref 4.22–5.81)
RDW: 12.7 % (ref 11.5–15.5)
WBC: 8.5 10*3/uL (ref 4.0–10.5)
nRBC: 0 % (ref 0.0–0.2)

## 2023-08-29 LAB — BASIC METABOLIC PANEL
Anion gap: 11 (ref 5–15)
BUN: 17 mg/dL (ref 6–20)
CO2: 23 mmol/L (ref 22–32)
Calcium: 9.2 mg/dL (ref 8.9–10.3)
Chloride: 102 mmol/L (ref 98–111)
Creatinine, Ser: 0.87 mg/dL (ref 0.61–1.24)
GFR, Estimated: >60 mL/min (ref >60)
Glucose, Bld: 179 mg/dL — ABNORMAL HIGH (ref 70–99)
Potassium: 3.7 mmol/L (ref 3.5–5.1)
Sodium: 136 mmol/L (ref 135–145)

## 2023-08-29 LAB — TYPE AND SCREEN
ABO/RH(D): A POS
Antibody Screen: NEGATIVE

## 2023-08-29 MED ORDER — CEFAZOLIN SODIUM-DEXTROSE 2-4 GM/100ML-% IV SOLN
2.0000 g | INTRAVENOUS | Status: AC
  Administered 2023-08-30: 2 g via INTRAVENOUS

## 2023-08-29 MED ORDER — CHLORHEXIDINE GLUCONATE CLOTH 2 % EX PADS
6.0000 | MEDICATED_PAD | Freq: Once | CUTANEOUS | Status: AC
  Administered 2023-08-30: 6 via TOPICAL

## 2023-08-29 MED ORDER — ORAL CARE MOUTH RINSE: 15.0000 mL | Freq: Once | OROMUCOSAL | Status: AC

## 2023-08-29 MED ORDER — CHLORHEXIDINE GLUCONATE 0.12 % MT SOLN
15.0000 mL | Freq: Once | OROMUCOSAL | Status: AC
  Administered 2023-08-30: 15 mL via OROMUCOSAL

## 2023-08-29 NOTE — Patient Instructions (Addendum)
 Your procedure is scheduled on: Aug 30, 2023 Wednesday Report to the Registration Desk on the 1st floor of the [facility] To find out your arrival time, please call [phone] between 1PM - 3PM on: Jan 14. 2025, Tuesday If your arrival time is 6:00 am, do not arrive before that time as the [facility] entrance doors do not open until 6:00 am.  REMEMBER: Instructions that are not followed completely may result in serious medical risk, up to and including death; or upon the discretion of your surgeon and anesthesiologist your surgery may need to be rescheduled.  Do not eat food after midnight the night before surgery.  No gum chewing or hard candies.  Hold metformin  two days prior to surgery. Last dose should have been  Jan. 13, 2025.   One week prior to surgery: Stop Anti-inflammatories (NSAIDS) such as Advil, Aleve, Ibuprofen, Motrin, Naproxen, Naprosyn and Aspirin  based products such as Excedrin, Goody's Powder, BC Powder. Stop ANY OVER THE COUNTER supplements until after surgery.  You may however, continue to take Tylenol  if needed for pain up until the day of surgery.    **Follow recommendations regarding stopping blood thinners.**  Continue taking Aspirin  but do not take Aspirin  on day of surgery. Last dose of aspirin  is Jan. 14, 2025  Continue taking all of your other prescription medications up until the day of surgery.  ON THE DAY OF SURGERY ONLY TAKE THESE MEDICATIONS WITH SIPS OF WATER :  metoprolol  succinate (TOPROL  XL) omeprazole  (PRILOSEC) 3.   rosuvastatin  (CRESTOR   Use Breo inhaler  on the day of surgery and bring albuterol   to the hospital.    No Alcohol for 24 hours before or after surgery.  No Smoking including e-cigarettes for 24 hours before surgery.  No chewable tobacco products for at least 6 hours before surgery.  No nicotine  patches on the day of surgery.  Do not use any recreational drugs for at least a week (preferably 2 weeks) before your  surgery.  Please be advised that the combination of cocaine and anesthesia may have negative outcomes, up to and including death. If you test positive for cocaine, your surgery will be cancelled.  On the morning of surgery brush your teeth with toothpaste and water , you may rinse your mouth with mouthwash if you wish. Do not swallow any toothpaste or mouthwash.  Use CHG Soap or wipes as directed on instruction sheet.-provided for you  Do not wear jewelry, make-up, hairpins, clips or nail polish.  For welded (permanent) jewelry: bracelets, anklets, waist bands, etc.  Please have this removed prior to surgery.  If it is not removed, there is a chance that hospital personnel will need to cut it off on the day of surgery.  Do not wear lotions, powders, or perfumes.   Do not shave body hair from the neck down 48 hours before surgery.  Contact lenses, hearing aids and dentures may not be worn into surgery.  Do not bring valuables to the hospital. [facility] is not responsible for any missing/lost belongings or valuables.    Notify your doctor if there is any change in your medical condition (cold, fever, infection).  Wear comfortable clothing (specific to your surgery type) to the hospital.  After surgery, you can help prevent lung complications by doing breathing exercises.  Take deep breaths and cough every 1-2 hours. Your doctor may order a device called an Incentive Spirometer to help you take deep breaths.  If you are being admitted to the  hospital overnight, leave your suitcase in the car. After surgery it may be brought to your room.   If you are being discharged the day of surgery, you will not be allowed to drive home. You will need a responsible individual to drive you home and stay with you for 24 hours after surgery.   If you are taking public transportation, you will need to have a responsible individual with you.  Please call the Pre-admissions Testing Dept. at [phone] if you have any questions about these instructions.  Surgery Visitation Policy:  Patients having surgery or a procedure may have two visitors.  Children under the age of 48 must have an adult with them who is not the patient.  Temporary Visitor Restrictions Due to increasing cases of flu, RSV and COVID-19: Children ages 43 and under will not be able to visit patients in [facility] hospitals under most circumstances.  Inpatient Visitation:    Visiting hours are 7 a.m. to 8 p.m. Up to four visitors are allowed at one time in a patient room. The visitors may rotate out with other people during the day.  One visitor age 82 or older may stay with the patient overnight and must be in the room by 8 p.m.      Preparing for Surgery with CHLORHEXIDINE  GLUCONATE (CHG) Soap  Chlorhexidine  Gluconate (CHG) Soap  o An antiseptic cleaner that kills germs and bonds with the skin to continue killing germs even after washing  o Used for showering the night before surgery and morning of surgery  Before surgery, you can play an important role by reducing the number of germs on your skin.  CHG (Chlorhexidine  gluconate) soap is an antiseptic cleanser which kills germs and bonds with the skin to continue killing germs even after washing.  Please do not use if you have an allergy to CHG or antibacterial soaps. If your skin becomes reddened/irritated stop using the CHG.  1. Shower the NIGHT BEFORE SURGERY and the MORNING OF SURGERY with CHG soap.  2. If you choose to wash your hair, wash your hair first as usual with your normal shampoo.  3. After shampooing, rinse your hair and body thoroughly to remove the shampoo.  4. Use CHG as you would any other liquid soap. You can apply CHG directly to the skin and wash gently with a scrungie or a clean washcloth.  5. Apply the CHG soap to your body only from the neck down. Do not use on open wounds or open sores. Avoid contact with your eyes, ears,  mouth, and genitals (private parts). Wash face and genitals (private parts) with your normal soap.  6. Wash thoroughly, paying special attention to the area where your surgery will be performed.  7. Thoroughly rinse your body with warm water .  8. Do not shower/wash with your normal soap after using and rinsing off the CHG soap.  9. Pat yourself dry with a clean towel.  10. Wear clean pajamas to bed the night before surgery.  12. Place clean sheets on your bed the night of your first shower and do not sleep with pets.  13. Shower again with the CHG soap on the day of surgery prior to arriving at the hospital.  14. Do not apply any deodorants/lotions/powders.  15. Please wear clean clothes to the hospital.

## 2023-08-30 ENCOUNTER — Encounter: Payer: Self-pay | Admitting: Vascular Surgery

## 2023-08-30 ENCOUNTER — Inpatient Hospital Stay: Payer: Self-pay | Admitting: Urgent Care

## 2023-08-30 ENCOUNTER — Encounter
Admission: RE | Disposition: A | Discharge status: Home Health | Payer: Self-pay | Source: Ambulatory Visit | Attending: Vascular Surgery

## 2023-08-30 ENCOUNTER — Other Ambulatory Visit: Payer: Self-pay

## 2023-08-30 ENCOUNTER — Inpatient Hospital Stay
Admission: RE | Admit: 2023-08-30 | Discharge: 2023-09-04 | DRG: 279 | Disposition: A | Discharge status: Home Health | Payer: Medicaid Other | Source: Ambulatory Visit | Attending: Vascular Surgery | Admitting: Vascular Surgery

## 2023-08-30 ENCOUNTER — Inpatient Hospital Stay: Payer: Medicaid Other

## 2023-08-30 ENCOUNTER — Inpatient Hospital Stay: Payer: Medicaid Other | Admitting: Urgent Care

## 2023-08-30 DIAGNOSIS — Z5986 Financial insecurity: Secondary | ICD-10-CM

## 2023-08-30 DIAGNOSIS — I10 Essential (primary) hypertension: Secondary | ICD-10-CM

## 2023-08-30 DIAGNOSIS — Z5941 Food insecurity: Secondary | ICD-10-CM | POA: Diagnosis not present

## 2023-08-30 DIAGNOSIS — I70223 Atherosclerosis of native arteries of extremities with rest pain, bilateral legs: Principal | ICD-10-CM

## 2023-08-30 DIAGNOSIS — E782 Mixed hyperlipidemia: Secondary | ICD-10-CM | POA: Diagnosis present

## 2023-08-30 DIAGNOSIS — M76891 Other specified enthesopathies of right lower limb, excluding foot: Secondary | ICD-10-CM | POA: Diagnosis present

## 2023-08-30 DIAGNOSIS — I745 Embolism and thrombosis of iliac artery: Secondary | ICD-10-CM | POA: Diagnosis present

## 2023-08-30 DIAGNOSIS — Z8249 Family history of ischemic heart disease and other diseases of the circulatory system: Secondary | ICD-10-CM

## 2023-08-30 DIAGNOSIS — Z01812 Encounter for preprocedural laboratory examination: Secondary | ICD-10-CM

## 2023-08-30 DIAGNOSIS — G4730 Sleep apnea, unspecified: Secondary | ICD-10-CM | POA: Diagnosis present

## 2023-08-30 DIAGNOSIS — Z7902 Long term (current) use of antithrombotics/antiplatelets: Secondary | ICD-10-CM | POA: Diagnosis not present

## 2023-08-30 DIAGNOSIS — I70229 Atherosclerosis of native arteries of extremities with rest pain, unspecified extremity: Secondary | ICD-10-CM | POA: Diagnosis present

## 2023-08-30 DIAGNOSIS — F17210 Nicotine dependence, cigarettes, uncomplicated: Secondary | ICD-10-CM | POA: Diagnosis present

## 2023-08-30 DIAGNOSIS — K219 Gastro-esophageal reflux disease without esophagitis: Secondary | ICD-10-CM | POA: Diagnosis present

## 2023-08-30 DIAGNOSIS — Z7982 Long term (current) use of aspirin: Secondary | ICD-10-CM

## 2023-08-30 DIAGNOSIS — E119 Type 2 diabetes mellitus without complications: Secondary | ICD-10-CM | POA: Diagnosis present

## 2023-08-30 DIAGNOSIS — J4540 Moderate persistent asthma, uncomplicated: Secondary | ICD-10-CM | POA: Diagnosis present

## 2023-08-30 DIAGNOSIS — Z7984 Long term (current) use of oral hypoglycemic drugs: Secondary | ICD-10-CM

## 2023-08-30 DIAGNOSIS — E1159 Type 2 diabetes mellitus with other circulatory complications: Secondary | ICD-10-CM | POA: Diagnosis not present

## 2023-08-30 HISTORY — PX: ENDARTERECTOMY FEMORAL: SHX5804

## 2023-08-30 HISTORY — PX: INSERTION OF ILIAC STENT: SHX6256

## 2023-08-30 LAB — CBC
HCT: 39.4 % (ref 39.0–52.0)
Hemoglobin: 13.3 g/dL (ref 13.0–17.0)
MCH: 32.3 pg (ref 26.0–34.0)
MCHC: 33.8 g/dL (ref 30.0–36.0)
MCV: 95.6 fL (ref 80.0–100.0)
Platelets: 256 10*3/uL (ref 150–400)
RBC: 4.12 MIL/uL — ABNORMAL LOW (ref 4.22–5.81)
RDW: 12.9 % (ref 11.5–15.5)
WBC: 14.4 10*3/uL — ABNORMAL HIGH (ref 4.0–10.5)
nRBC: 0 % (ref 0.0–0.2)

## 2023-08-30 LAB — GLUCOSE, CAPILLARY
Glucose-Capillary: 117 mg/dL — ABNORMAL HIGH (ref 70–99)
Glucose-Capillary: 142 mg/dL — ABNORMAL HIGH (ref 70–99)
Glucose-Capillary: 149 mg/dL — ABNORMAL HIGH (ref 70–99)

## 2023-08-30 LAB — CREATININE, SERUM
Creatinine, Ser: 0.77 mg/dL (ref 0.61–1.24)
GFR, Estimated: >60 mL/min (ref >60)

## 2023-08-30 LAB — ABO/RH: ABO/RH(D): A POS

## 2023-08-30 LAB — MRSA NEXT GEN BY PCR, NASAL: MRSA by PCR Next Gen: NOT DETECTED

## 2023-08-30 SURGERY — ENDARTERECTOMY, FEMORAL
Anesthesia: General

## 2023-08-30 MED ORDER — INSULIN ASPART 100 UNIT/ML IJ SOLN: 0.0000 [IU] | Freq: Every day | INTRAMUSCULAR | Status: DC

## 2023-08-30 MED ORDER — HEPARIN SODIUM (PORCINE) 1000 UNIT/ML IJ SOLN
INTRAMUSCULAR | Status: DC | PRN
  Administered 2023-08-30: 7000 [IU] via INTRAVENOUS
  Administered 2023-08-30: 2000 [IU] via INTRAVENOUS

## 2023-08-30 MED ORDER — DEXMEDETOMIDINE HCL IN NACL 80 MCG/20ML IV SOLN
INTRAVENOUS | Status: DC | PRN
  Administered 2023-08-30: 20 ug via INTRAVENOUS

## 2023-08-30 MED ORDER — OXYCODONE HCL 5 MG PO TABS
5.0000 mg | ORAL_TABLET | ORAL | Status: DC | PRN
  Administered 2023-08-30: 10 mg via ORAL
  Administered 2023-08-31: 5 mg via ORAL
  Administered 2023-09-01 – 2023-09-02 (×2): 10 mg via ORAL
  Administered 2023-09-02: 5 mg via ORAL
  Administered 2023-09-03 (×3): 10 mg via ORAL
  Administered 2023-09-03: 5 mg via ORAL
  Filled 2023-08-30 (×6): qty 2
  Filled 2023-08-30 (×2): qty 1
  Filled 2023-08-30 (×2): qty 2

## 2023-08-30 MED ORDER — CHLORHEXIDINE GLUCONATE 0.12 % MT SOLN
OROMUCOSAL | Status: AC
  Filled 2023-08-30: qty 15

## 2023-08-30 MED ORDER — ONDANSETRON HCL 4 MG/2ML IJ SOLN
INTRAMUSCULAR | Status: AC
  Filled 2023-08-30: qty 2

## 2023-08-30 MED ORDER — GUAIFENESIN-DM 100-10 MG/5ML PO SYRP
15.0000 mL | ORAL_SOLUTION | ORAL | Status: DC | PRN
Start: 2023-08-30 — End: 2023-09-04

## 2023-08-30 MED ORDER — CLOPIDOGREL BISULFATE 75 MG PO TABS
75.0000 mg | ORAL_TABLET | Freq: Every day | ORAL | Status: DC
  Administered 2023-08-31 – 2023-09-04 (×5): 75 mg via ORAL
  Filled 2023-08-30 (×5): qty 1

## 2023-08-30 MED ORDER — OXYCODONE HCL 5 MG PO TABS: 5.0000 mg | ORAL_TABLET | Freq: Once | ORAL | Status: DC | PRN

## 2023-08-30 MED ORDER — ALBUTEROL SULFATE (2.5 MG/3ML) 0.083% IN NEBU: 3.0000 mL | INHALATION_SOLUTION | Freq: Four times a day (QID) | RESPIRATORY_TRACT | Status: DC | PRN

## 2023-08-30 MED ORDER — FENTANYL CITRATE (PF) 100 MCG/2ML IJ SOLN
INTRAMUSCULAR | Status: AC
  Filled 2023-08-30: qty 2

## 2023-08-30 MED ORDER — HYDROMORPHONE HCL 1 MG/ML IJ SOLN
INTRAMUSCULAR | Status: DC | PRN
  Administered 2023-08-30: 1 mg via INTRAVENOUS

## 2023-08-30 MED ORDER — GENTAMICIN SULFATE 40 MG/ML IJ SOLN
INTRAMUSCULAR | Status: DC | PRN
  Administered 2023-08-30: 80 mg

## 2023-08-30 MED ORDER — HEPARIN 30,000 UNITS/1000 ML (OHS) CELLSAVER SOLUTION
Status: AC | PRN
  Administered 2023-08-30: 1

## 2023-08-30 MED ORDER — HYDRALAZINE HCL 20 MG/ML IJ SOLN: 5.0000 mg | INTRAMUSCULAR | Status: DC | PRN

## 2023-08-30 MED ORDER — VANCOMYCIN HCL 1000 MG IV SOLR
INTRAVENOUS | Status: AC
  Filled 2023-08-30: qty 20

## 2023-08-30 MED ORDER — MAGNESIUM SULFATE 2 GM/50ML IV SOLN: 2.0000 g | Freq: Every day | INTRAVENOUS | Status: DC | PRN

## 2023-08-30 MED ORDER — ONDANSETRON HCL 4 MG/2ML IJ SOLN: 4.0000 mg | Freq: Four times a day (QID) | INTRAMUSCULAR | Status: DC | PRN

## 2023-08-30 MED ORDER — VISTASEAL 10 ML SINGLE DOSE KIT
PACK | CUTANEOUS | Status: AC
  Filled 2023-08-30: qty 10

## 2023-08-30 MED ORDER — ACETAMINOPHEN 325 MG RE SUPP: 325.0000 mg | RECTAL | Status: DC | PRN

## 2023-08-30 MED ORDER — LACTATED RINGERS IV SOLN: INTRAVENOUS | Status: DC | PRN

## 2023-08-30 MED ORDER — CEFAZOLIN SODIUM-DEXTROSE 2-4 GM/100ML-% IV SOLN
INTRAVENOUS | Status: AC
  Filled 2023-08-30: qty 100

## 2023-08-30 MED ORDER — ACETAMINOPHEN 10 MG/ML IV SOLN
INTRAVENOUS | Status: AC
  Filled 2023-08-30: qty 100

## 2023-08-30 MED ORDER — PROPOFOL 10 MG/ML IV BOLUS
INTRAVENOUS | Status: AC
  Filled 2023-08-30: qty 20

## 2023-08-30 MED ORDER — REMIFENTANIL HCL 1 MG IV SOLR: INTRAVENOUS | Status: DC | PRN

## 2023-08-30 MED ORDER — ASPIRIN 81 MG PO TBEC
81.0000 mg | DELAYED_RELEASE_TABLET | Freq: Every day | ORAL | Status: DC
  Administered 2023-08-30 – 2023-09-04 (×6): 81 mg via ORAL
  Filled 2023-08-30 (×6): qty 1

## 2023-08-30 MED ORDER — DEXAMETHASONE SODIUM PHOSPHATE 10 MG/ML IJ SOLN
INTRAMUSCULAR | Status: AC
  Filled 2023-08-30: qty 1

## 2023-08-30 MED ORDER — SODIUM CHLORIDE 0.9 % IV SOLN
INTRAVENOUS | Status: DC | PRN
  Administered 2023-08-30: 501 mL via SURGICAL_CAVITY

## 2023-08-30 MED ORDER — HEPARIN 30,000 UNITS/1000 ML (OHS) CELLSAVER SOLUTION
Status: AC
  Filled 2023-08-30: qty 1000

## 2023-08-30 MED ORDER — HYDROMORPHONE HCL 1 MG/ML IJ SOLN
INTRAMUSCULAR | Status: AC
  Filled 2023-08-30: qty 1

## 2023-08-30 MED ORDER — PROPOFOL 10 MG/ML IV BOLUS
INTRAVENOUS | Status: DC | PRN
  Administered 2023-08-30: 180 mg via INTRAVENOUS

## 2023-08-30 MED ORDER — REMIFENTANIL HCL 1 MG IV SOLR
INTRAVENOUS | Status: AC
  Filled 2023-08-30: qty 1000

## 2023-08-30 MED ORDER — PHENYLEPHRINE HCL (PRESSORS) 10 MG/ML IV SOLN
INTRAVENOUS | Status: DC | PRN
  Administered 2023-08-30 (×3): 80 ug via INTRAVENOUS
  Administered 2023-08-30: 40 ug via INTRAVENOUS

## 2023-08-30 MED ORDER — HEMOSTATIC AGENTS (NO CHARGE) OPTIME
TOPICAL | Status: DC | PRN
  Administered 2023-08-30: 2 via TOPICAL

## 2023-08-30 MED ORDER — ALUM & MAG HYDROXIDE-SIMETH 200-200-20 MG/5ML PO SUSP: 15.0000 mL | ORAL | Status: DC | PRN

## 2023-08-30 MED ORDER — SODIUM CHLORIDE 0.9 % IV SOLN: INTRAVENOUS | Status: DC | PRN

## 2023-08-30 MED ORDER — STERILE WATER FOR IRRIGATION IR SOLN
Status: DC | PRN
  Administered 2023-08-30: 1000 mL

## 2023-08-30 MED ORDER — ROCURONIUM BROMIDE 100 MG/10ML IV SOLN
INTRAVENOUS | Status: DC | PRN
  Administered 2023-08-30 (×2): 20 mg via INTRAVENOUS
  Administered 2023-08-30: 60 mg via INTRAVENOUS
  Administered 2023-08-30 (×3): 20 mg via INTRAVENOUS

## 2023-08-30 MED ORDER — DROPERIDOL 2.5 MG/ML IJ SOLN: 0.6250 mg | Freq: Once | INTRAMUSCULAR | Status: DC | PRN

## 2023-08-30 MED ORDER — LOSARTAN POTASSIUM 50 MG PO TABS
100.0000 mg | ORAL_TABLET | Freq: Every day | ORAL | Status: DC
Start: 2023-08-31 — End: 2023-09-04
  Administered 2023-08-31 – 2023-09-04 (×5): 100 mg via ORAL
  Filled 2023-08-30 (×5): qty 2

## 2023-08-30 MED ORDER — INSULIN ASPART 100 UNIT/ML IJ SOLN
0.0000 [IU] | Freq: Three times a day (TID) | INTRAMUSCULAR | Status: DC
  Administered 2023-08-31: 3 [IU] via SUBCUTANEOUS
  Administered 2023-08-31 – 2023-09-04 (×7): 2 [IU] via SUBCUTANEOUS
  Filled 2023-08-30 (×8): qty 1

## 2023-08-30 MED ORDER — DEXMEDETOMIDINE HCL IN NACL 80 MCG/20ML IV SOLN
INTRAVENOUS | Status: AC
  Filled 2023-08-30: qty 20

## 2023-08-30 MED ORDER — SODIUM CHLORIDE 0.9 % IV SOLN
INTRAVENOUS | Status: DC | PRN
  Administered 2023-08-30: .1 ug/kg/min via INTRAVENOUS

## 2023-08-30 MED ORDER — MIDAZOLAM HCL 2 MG/2ML IJ SOLN
INTRAMUSCULAR | Status: DC | PRN
  Administered 2023-08-30: 2 mg via INTRAVENOUS

## 2023-08-30 MED ORDER — LIDOCAINE HCL (CARDIAC) PF 100 MG/5ML IV SOSY
PREFILLED_SYRINGE | INTRAVENOUS | Status: DC | PRN
  Administered 2023-08-30: 100 mg via INTRAVENOUS

## 2023-08-30 MED ORDER — GENTAMICIN SULFATE 40 MG/ML IJ SOLN
INTRAMUSCULAR | Status: AC
  Filled 2023-08-30: qty 2

## 2023-08-30 MED ORDER — SUGAMMADEX SODIUM 200 MG/2ML IV SOLN
INTRAVENOUS | Status: DC | PRN
  Administered 2023-08-30: 200 mg via INTRAVENOUS

## 2023-08-30 MED ORDER — SODIUM CHLORIDE 0.9 % IV SOLN: 500.0000 mL | Freq: Once | INTRAVENOUS | Status: DC | PRN

## 2023-08-30 MED ORDER — PHENYLEPHRINE HCL-NACL 20-0.9 MG/250ML-% IV SOLN
INTRAVENOUS | Status: DC | PRN
  Administered 2023-08-30: 10 ug/min via INTRAVENOUS

## 2023-08-30 MED ORDER — OXYCODONE HCL 5 MG/5ML PO SOLN: 5.0000 mg | Freq: Once | ORAL | Status: DC | PRN

## 2023-08-30 MED ORDER — DEXAMETHASONE SODIUM PHOSPHATE 10 MG/ML IJ SOLN
INTRAMUSCULAR | Status: DC | PRN
  Administered 2023-08-30: 4 mg via INTRAVENOUS

## 2023-08-30 MED ORDER — PHENOL 1.4 % MT LIQD: 1.0000 | OROMUCOSAL | Status: DC | PRN

## 2023-08-30 MED ORDER — NITROGLYCERIN IN D5W 200-5 MCG/ML-% IV SOLN: 5.0000 ug/min | INTRAVENOUS | Status: DC

## 2023-08-30 MED ORDER — FENTANYL CITRATE (PF) 100 MCG/2ML IJ SOLN
25.0000 ug | INTRAMUSCULAR | Status: DC | PRN
  Administered 2023-08-30 (×4): 50 ug via INTRAVENOUS

## 2023-08-30 MED ORDER — HYDROMORPHONE HCL 1 MG/ML IJ SOLN: 1.0000 mg | Freq: Once | INTRAMUSCULAR | Status: DC | PRN

## 2023-08-30 MED ORDER — ENOXAPARIN SODIUM 40 MG/0.4ML IJ SOSY
40.0000 mg | PREFILLED_SYRINGE | INTRAMUSCULAR | Status: DC
  Administered 2023-08-31 – 2023-09-04 (×5): 40 mg via SUBCUTANEOUS
  Filled 2023-08-30 (×5): qty 0.4

## 2023-08-30 MED ORDER — LABETALOL HCL 5 MG/ML IV SOLN: 10.0000 mg | INTRAVENOUS | Status: DC | PRN

## 2023-08-30 MED ORDER — HEPARIN SODIUM (PORCINE) 1000 UNIT/ML IJ SOLN
INTRAMUSCULAR | Status: AC
  Filled 2023-08-30: qty 10

## 2023-08-30 MED ORDER — FLUTICASONE FUROATE-VILANTEROL 200-25 MCG/ACT IN AEPB
1.0000 | INHALATION_SPRAY | Freq: Every day | RESPIRATORY_TRACT | Status: DC
  Administered 2023-08-31 – 2023-09-04 (×5): 1 via RESPIRATORY_TRACT
  Filled 2023-08-30: qty 28

## 2023-08-30 MED ORDER — BUPIVACAINE HCL (PF) 0.5 % IJ SOLN
INTRAMUSCULAR | Status: AC
  Filled 2023-08-30: qty 30

## 2023-08-30 MED ORDER — VISTASEAL 10 ML SINGLE DOSE KIT
PACK | CUTANEOUS | Status: DC | PRN
  Administered 2023-08-30: 10 mL via TOPICAL

## 2023-08-30 MED ORDER — ONDANSETRON HCL 4 MG/2ML IJ SOLN
INTRAMUSCULAR | Status: DC | PRN
  Administered 2023-08-30: 4 mg via INTRAVENOUS

## 2023-08-30 MED ORDER — HYDROMORPHONE HCL 1 MG/ML IJ SOLN
0.5000 mg | INTRAMUSCULAR | Status: DC | PRN
  Administered 2023-08-31 (×4): 0.5 mg via INTRAVENOUS
  Administered 2023-09-01 (×3): 1 mg via INTRAVENOUS
  Filled 2023-08-30 (×8): qty 1

## 2023-08-30 MED ORDER — ROSUVASTATIN CALCIUM 10 MG PO TABS
40.0000 mg | ORAL_TABLET | Freq: Every day | ORAL | Status: DC
  Administered 2023-08-31 – 2023-09-04 (×5): 40 mg via ORAL
  Filled 2023-08-30 (×5): qty 4

## 2023-08-30 MED ORDER — HEPARIN SODIUM (PORCINE) 5000 UNIT/ML IJ SOLN
INTRAMUSCULAR | Status: AC
  Filled 2023-08-30: qty 1

## 2023-08-30 MED ORDER — METOPROLOL TARTRATE 5 MG/5ML IV SOLN: 2.0000 mg | INTRAVENOUS | Status: DC | PRN

## 2023-08-30 MED ORDER — ACETAMINOPHEN 325 MG PO TABS: 325.0000 mg | ORAL_TABLET | ORAL | Status: DC | PRN

## 2023-08-30 MED ORDER — SORBITOL 70 % SOLN: 30.0000 mL | Freq: Every day | Status: DC | PRN

## 2023-08-30 MED ORDER — ROCURONIUM BROMIDE 10 MG/ML (PF) SYRINGE
PREFILLED_SYRINGE | INTRAVENOUS | Status: AC
  Filled 2023-08-30: qty 10

## 2023-08-30 MED ORDER — DOCUSATE SODIUM 100 MG PO CAPS
100.0000 mg | ORAL_CAPSULE | Freq: Every day | ORAL | Status: DC
  Administered 2023-08-31 – 2023-09-04 (×4): 100 mg via ORAL
  Filled 2023-08-30 (×5): qty 1

## 2023-08-30 MED ORDER — LIDOCAINE HCL (PF) 2 % IJ SOLN
INTRAMUSCULAR | Status: AC
  Filled 2023-08-30: qty 5

## 2023-08-30 MED ORDER — SODIUM CHLORIDE 0.9 % IV SOLN: INTRAVENOUS | Status: DC

## 2023-08-30 MED ORDER — VANCOMYCIN HCL 1 G IV SOLR
INTRAVENOUS | Status: DC | PRN
  Administered 2023-08-30: 1000 mg

## 2023-08-30 MED ORDER — FENTANYL CITRATE (PF) 100 MCG/2ML IJ SOLN
INTRAMUSCULAR | Status: DC | PRN
  Administered 2023-08-30 (×2): 50 ug via INTRAVENOUS

## 2023-08-30 MED ORDER — LABETALOL HCL 5 MG/ML IV SOLN
INTRAVENOUS | Status: DC | PRN
  Administered 2023-08-30: 10 mg via INTRAVENOUS
  Administered 2023-08-30: 5 mg via INTRAVENOUS

## 2023-08-30 MED ORDER — PANTOPRAZOLE SODIUM 40 MG IV SOLR
40.0000 mg | Freq: Every day | INTRAVENOUS | Status: DC
  Administered 2023-08-30 – 2023-08-31 (×2): 40 mg via INTRAVENOUS
  Filled 2023-08-30 (×2): qty 10

## 2023-08-30 MED ORDER — CEFAZOLIN SODIUM-DEXTROSE 2-4 GM/100ML-% IV SOLN
2.0000 g | Freq: Three times a day (TID) | INTRAVENOUS | Status: AC
  Administered 2023-08-30 – 2023-08-31 (×2): 2 g via INTRAVENOUS
  Filled 2023-08-30: qty 100

## 2023-08-30 MED ORDER — CHLORHEXIDINE GLUCONATE CLOTH 2 % EX PADS
6.0000 | MEDICATED_PAD | Freq: Every day | CUTANEOUS | Status: DC
  Administered 2023-08-31 – 2023-09-02 (×3): 6 via TOPICAL

## 2023-08-30 MED ORDER — SENNOSIDES-DOCUSATE SODIUM 8.6-50 MG PO TABS
1.0000 | ORAL_TABLET | Freq: Every evening | ORAL | Status: DC | PRN
  Administered 2023-09-03 (×2): 1 via ORAL
  Filled 2023-08-30 (×2): qty 1

## 2023-08-30 MED ORDER — MIDAZOLAM HCL 2 MG/2ML IJ SOLN
INTRAMUSCULAR | Status: AC
  Filled 2023-08-30: qty 2

## 2023-08-30 MED ORDER — ACETAMINOPHEN 10 MG/ML IV SOLN
INTRAVENOUS | Status: DC | PRN
  Administered 2023-08-30: 1000 mg via INTRAVENOUS

## 2023-08-30 MED ORDER — ACETAMINOPHEN 10 MG/ML IV SOLN: 1000.0000 mg | Freq: Once | INTRAVENOUS | Status: DC | PRN

## 2023-08-30 MED ORDER — POTASSIUM CHLORIDE CRYS ER 20 MEQ PO TBCR: 20.0000 meq | EXTENDED_RELEASE_TABLET | Freq: Every day | ORAL | Status: DC | PRN

## 2023-08-30 MED ORDER — HYDROMORPHONE HCL 1 MG/ML IJ SOLN
1.0000 mg | INTRAMUSCULAR | Status: DC | PRN
Start: 2023-08-30 — End: 2023-08-30
  Administered 2023-08-30: 1 mg via INTRAVENOUS

## 2023-08-30 MED ORDER — METOPROLOL SUCCINATE ER 100 MG PO TB24
100.0000 mg | ORAL_TABLET | Freq: Every day | ORAL | Status: DC
Start: 2023-08-31 — End: 2023-09-04
  Administered 2023-08-31 – 2023-09-04 (×5): 100 mg via ORAL
  Filled 2023-08-30 (×2): qty 4
  Filled 2023-08-30 (×4): qty 1
  Filled 2023-08-30: qty 4

## 2023-08-30 MED ORDER — EPHEDRINE SULFATE (PRESSORS) 50 MG/ML IJ SOLN
INTRAMUSCULAR | Status: DC | PRN
  Administered 2023-08-30 (×4): 5 mg via INTRAVENOUS

## 2023-08-30 SURGICAL SUPPLY — 75 items
BAG DECANTER FOR FLEXI CONT (MISCELLANEOUS) ×4 IMPLANT
BALLN LUTONIX DCB 7X40X130 (BALLOONS) ×3
BALLN LUTONIX DCB 7X60X130 (BALLOONS) ×3
BALLOON LUTONIX DCB 7X40X130 (BALLOONS) IMPLANT
BALLOON LUTONIX DCB 7X60X130 (BALLOONS) IMPLANT
BLADE SURG SZ11 CARB STEEL (BLADE) ×4 IMPLANT
BRUSH SCRUB EZ 4% CHG (MISCELLANEOUS) ×4 IMPLANT
CATH BEACON 5 .035 40 KMP TP (CATHETERS) IMPLANT
CATH FOL 2WAY LX 14X5 (CATHETERS) IMPLANT
CATH SHOCKWAVE M5 6.0X60 (CATHETERS) IMPLANT
CHLORAPREP W/TINT 26 (MISCELLANEOUS) ×4 IMPLANT
CLAMP SUTURE YELLOW 5 PAIRS (MISCELLANEOUS) ×4 IMPLANT
DEVICE PRESTO INFLATION (MISCELLANEOUS) IMPLANT
DRAPE C-ARM XRAY 36X54 (DRAPES) ×4 IMPLANT
DRAPE INCISE IOBAN 66X45 STRL (DRAPES) ×4 IMPLANT
DRESSING PEEL AND PLC PRVNA 13 (GAUZE/BANDAGES/DRESSINGS) IMPLANT
DRESSING SURGICEL FIBRLLR 1X2 (HEMOSTASIS) ×4 IMPLANT
DRSG PEEL AND PLACE PREVENA 13 (GAUZE/BANDAGES/DRESSINGS) ×6
DRSG SURGICEL FIBRILLAR 1X2 (HEMOSTASIS) ×6
ELECT CAUTERY BLADE 6.4 (BLADE) ×4 IMPLANT
ELECT REM PT RETURN 9FT ADLT (ELECTROSURGICAL) ×6
ELECTRODE REM PT RTRN 9FT ADLT (ELECTROSURGICAL) ×4 IMPLANT
GLIDEWIRE ADV .035X260CM (WIRE) IMPLANT
GLOVE BIO SURGEON STRL SZ7 (GLOVE) ×8 IMPLANT
GLOVE SURG SYN 7.0 (GLOVE) ×3
GLOVE SURG SYN 7.0 PF PI (GLOVE) ×4 IMPLANT
GLOVE SURG SYN 8.0 (GLOVE) ×3
GLOVE SURG SYN 8.0 PF PI (GLOVE) ×4 IMPLANT
GOWN STRL REUS W/ TWL LRG LVL3 (GOWN DISPOSABLE) ×8 IMPLANT
GOWN STRL REUS W/ TWL XL LVL3 (GOWN DISPOSABLE) ×4 IMPLANT
GOWN STRL REUS W/TWL 2XL LVL3 (GOWN DISPOSABLE) ×4 IMPLANT
GRAFT VASC PATCH XENOSURE 1X14 (Vascular Products) IMPLANT
IV NS 500ML BAXH (IV SOLUTION) ×4 IMPLANT
KIT PREVENA INCISION MGT 13 (CANNISTER) IMPLANT
KIT STIMULAN RAPID CURE 5CC (Orthopedic Implant) IMPLANT
KIT TURNOVER KIT A (KITS) ×4 IMPLANT
LABEL OR SOLS (LABEL) ×4 IMPLANT
LOOP VESSEL MAXI 1X406 RED (MISCELLANEOUS) ×8 IMPLANT
LOOP VESSEL MINI 0.8X406 BLUE (MISCELLANEOUS) ×12 IMPLANT
MANIFOLD NEPTUNE II (INSTRUMENTS) ×4 IMPLANT
NDL HYPO 18GX1.5 BLUNT FILL (NEEDLE) ×4 IMPLANT
NEEDLE HYPO 18GX1.5 BLUNT FILL (NEEDLE) ×3
NS IRRIG 500ML POUR BTL (IV SOLUTION) ×4 IMPLANT
PACK ANGIOGRAPHY (CUSTOM PROCEDURE TRAY) IMPLANT
PACK BASIN MAJOR ARMC (MISCELLANEOUS) ×4 IMPLANT
PACK UNIVERSAL (MISCELLANEOUS) ×4 IMPLANT
PENCIL SMOKE EVACUATOR (MISCELLANEOUS) IMPLANT
SET WALTER ACTIVATION W/DRAPE (SET/KITS/TRAYS/PACK) ×4 IMPLANT
SHEATH BRITE TIP 7FRX11 (SHEATH) IMPLANT
SHEATH BRITE TIP 7FRX5.5 (SHEATH) IMPLANT
SPIKE FLUID TRANSFER (MISCELLANEOUS) ×4 IMPLANT
SPONGE T-LAP 18X18 ~~LOC~~+RFID (SPONGE) IMPLANT
STAPLER SKIN PROX 35W (STAPLE) ×4 IMPLANT
STENT LIFESTAR 9X40 (Permanent Stent) IMPLANT
STENT VIABAHN 8X10X120 (Permanent Stent) IMPLANT
STENT VIABAHN 8X2.5X120 (Permanent Stent) IMPLANT
STENT VIABAHN 8X7.5X120 (Permanent Stent) IMPLANT
SUT ETHILON 3-0 FS-10 30 BLK (SUTURE) ×3
SUT PROLENE 5 0 RB 1 DA (SUTURE) ×16 IMPLANT
SUT PROLENE 6 0 BV (SUTURE) ×24 IMPLANT
SUT PROLENE 7 0 BV 1 (SUTURE) ×8 IMPLANT
SUT SILK 2-0 18XBRD TIE 12 (SUTURE) ×4 IMPLANT
SUT SILK 3-0 18XBRD TIE 12 (SUTURE) ×4 IMPLANT
SUT VIC AB 2-0 CT1 TAPERPNT 27 (SUTURE) ×8 IMPLANT
SUT VIC AB 3-0 SH 27X BRD (SUTURE) ×4 IMPLANT
SUT VICRYL+ 3-0 36IN CT-1 (SUTURE) ×8 IMPLANT
SUTURE EHLN 3-0 FS-10 30 BLK (SUTURE) ×4 IMPLANT
SYR 20ML LL LF (SYRINGE) ×4 IMPLANT
SYR 5ML LL (SYRINGE) ×4 IMPLANT
TAG SUTURE CLAMP YLW 5PR (MISCELLANEOUS) ×3
TRAP FLUID SMOKE EVACUATOR (MISCELLANEOUS) ×4 IMPLANT
TRAY FOLEY MTR SLVR 16FR STAT (SET/KITS/TRAYS/PACK) ×4 IMPLANT
WATER STERILE IRR 1000ML POUR (IV SOLUTION) ×4 IMPLANT
WATER STERILE IRR 500ML POUR (IV SOLUTION) ×4 IMPLANT
WIRE RUNTHROUGH .014X300CM (WIRE) IMPLANT

## 2023-08-30 NOTE — Anesthesia Preprocedure Evaluation (Addendum)
 Anesthesia Evaluation  Patient identified by MRN, date of birth, ID band Patient awake    Reviewed: Allergy & Precautions, H&P , NPO status , Patient's Chart, lab work & pertinent test results, reviewed documented beta blocker date and time   Airway Mallampati: II  TM Distance: >3 FB Neck ROM: full    Dental  (+) Poor Dentition, Missing,    Pulmonary shortness of breath and with exertion, asthma , sleep apnea , Current Smoker and Patient abstained from smoking.   Pulmonary exam normal        Cardiovascular Exercise Tolerance: Poor hypertension, Pt. on home beta blockers + Peripheral Vascular Disease  Normal cardiovascular exam  EKG 11/24: Incomplete right bundle branch block  ECHO 12/24:  1. Left ventricular ejection fraction, by estimation, is 60 to 65%. The  left ventricle has normal function. The left ventricle has no regional  wall motion abnormalities. Left ventricular diastolic parameters were  normal. The average left ventricular  global longitudinal strain is -15.9 %.   2. Right ventricular systolic function is normal. The right ventricular  size is normal. Tricuspid regurgitation signal is inadequate for assessing  PA pressure.   3. The mitral valve is normal in structure. Mild mitral valve  regurgitation. No evidence of mitral stenosis.   4. The aortic valve has an indeterminant number of cusps. Aortic valve  regurgitation is not visualized. No aortic stenosis is present.   5. The inferior vena cava is normal in size with greater than 50%  respiratory variability, suggesting right atrial pressure of 3 mmHg.     Neuro/Psych  PSYCHIATRIC DISORDERS      negative neurological ROS     GI/Hepatic Neg liver ROS,GERD  Medicated and Controlled,,  Endo/Other  diabetes, Well Controlled, Type 2    Renal/GU      Musculoskeletal  (+) Arthritis ,    Abdominal Normal abdominal exam  (+)   Peds  Hematology negative  hematology ROS (+)   Anesthesia Other Findings Cardiology Clearance: .  Coronary CTA last year showed minimal CAD.  [name] chronic shortness of breath was stable at his last visit in November.  He was also without chest pain.  I think it is reasonable for him to proceed with planned vascular surgery without additional cardiac testing or intervention.  Based on ACS perioperative risk calculator, he is low to intermediate risk for a perioperative cardiac complication (~1%).  Past Medical History: No date: Arthritis No date: Asthma No date: Dyspnea 12/17/2020: Elevated lipids 11/25/2020: Encounter to establish care No date: GERD (gastroesophageal reflux disease) No date: Hypertension No date: Neuromuscular disorder (HCC) No date: Pre-diabetes No date: Sleep apnea No date: SVT (supraventricular tachycardia) (HCC) 03/27/2023: Type 2 diabetes mellitus without complication, without  long-term current use of insulin  [facility])  Past Surgical History: 03/09/2021: COLONOSCOPY; N/A     Comment:  Procedure: COLONOSCOPY;  Surgeon: Luke Salaam, MD;                Location: [name] ENDOSCOPY;  Service: Gastroenterology;                Laterality: N/A; 07/25/2023: LOWER EXTREMITY ANGIOGRAPHY; Left     Comment:  Procedure: Lower Extremity Angiography;  Surgeon:               Jackquelyn Mass, MD;  Location: [facility] INVASIVE CV LAB;               Service: Cardiovascular;  Laterality: Left; No date: MANDIBLE FRACTURE SURGERY  BMI    Body Mass Index: 29.67 kg/m      Reproductive/Obstetrics negative OB ROS                             Anesthesia Physical Anesthesia Plan  ASA: 3  Anesthesia Plan: General ETT   Post-op Pain Management: Regional block*, Toradol IV (intra-op)* and Ofirmev  IV (intra-op)*   Induction: Intravenous  PONV Risk Score and Plan: 2 and Ondansetron , Dexamethasone  and Midazolam   Airway Management Planned: Oral ETT  Additional Equipment:  Arterial line  Intra-op Plan:   Post-operative Plan: Extubation in OR  Informed Consent: I have reviewed the patients History and Physical, chart, labs and discussed the procedure including the risks, benefits and alternatives for the proposed anesthesia with the patient or authorized representative who has indicated his/her understanding and acceptance.     Dental Advisory Given  Plan Discussed with: CRNA and Surgeon  Anesthesia Plan Comments:         Anesthesia Quick Evaluation

## 2023-08-30 NOTE — Anesthesia Procedure Notes (Addendum)
 Procedure Name: Intubation Date/Time: 08/30/2023 1:46 PM  Performed by: Wilkins Hardy I, CRNAPre-anesthesia Checklist: Patient identified, Emergency Drugs available, Suction available and Patient being monitored Patient Re-evaluated:Patient Re-evaluated prior to induction Oxygen Delivery Method: Circle system utilized Preoxygenation: Pre-oxygenation with 100% oxygen Induction Type: IV induction Ventilation: Mask ventilation without difficulty Laryngoscope Size: McGrath and 4 Grade View: Grade I Tube type: Oral Tube size: 7.5 mm Number of attempts: 1 Airway Equipment and Method: Stylet and Oral airway Placement Confirmation: ETT inserted through vocal cords under direct vision, positive ETCO2 and breath sounds checked- equal and bilateral Secured at: 23 cm Tube secured with: Tape Dental Injury: Teeth and Oropharynx as per pre-operative assessment

## 2023-08-30 NOTE — Op Note (Signed)
 OPERATIVE NOTE   PROCEDURE: 1.   Left common femoral and profunda femoris,endarterectomies 2.   Right common femoral and profunda femoris endarterectomies 3.   Aortogram and iliofemoral angiograms 4.   Use of the shockwave device for angioplasty and lithotripsy of the left external iliac artery with 6 mm diameter by 6 cm length shockwave device 5.   Use of the shockwave device for angioplasty and lithotripsy of the right external iliac artery with 6 mm diameter by 6 cm length shockwave device 6.   Stent placement x 3 to the right external iliac artery with a 9 mm diameter by 4 cm length life star stent, an 8 mm diameter by 7.5 cm length Viabahn stent, and an 8 mm diameter by 2.5 cm length Viabahn stent 7.   Stent placement x 2 to the left external iliac artery with a 9 mm diameter by 4 cm length life star stent and 8 mm diameter by 10 cm length Viabahn stent 8.   Incisional (disposable) VAC dressings on both groin wounds   PRE-OPERATIVE DIAGNOSIS: 1.Atherosclerotic occlusive disease bilateral lower extremities with rest pain   POST-OPERATIVE DIAGNOSIS: Same  SURGEON: Mikki Alexander, MD  CO-surgeon:  Devon Fogo MD  ANESTHESIA:  general  ESTIMATED BLOOD LOSS: 400 cc  FINDING(S): 1.  significant plaque in bilateral common femoral and profunda femoris arteries 2.  Occluded right external iliac artery 3.  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  INDICATIONS:    Patient presents with rest pain of both lower extremities.  Bilateral femoral endarterectomies as well as iliac artery intervention are planned to try to improve perfusion.  The risks and benefits as well as alternative therapies including intervention were reviewed in detail all questions were answered the patient agrees to proceed with surgery.  DESCRIPTION: After obtaining full informed written consent, the patient was brought back to the operating  room and placed supine upon the operating table.  The patient received IV antibiotics prior to induction.  After obtaining adequate anesthesia, the patient was prepped and draped in the standard fashion appropriate time out is called.    With myself working on the right and Dr. Prescilla Brod working on the left we began by dissecting out the femoral arteries on each side. Vertical incisions were created overlying both femoral arteries. The common femoral artery proximally, and superficial femoral artery, and primary profunda femoris artery branches were encircled with vessel loops and prepared for control. Both femoral arteries were found to have significant plaque from the common femoral artery into the profunda femoral arteries.   7000 units of heparin  was given and allowed circulate for 5 minutes.   Attention is then turned to the right femoral artery.  An arteriotomy is made with 11 blade and extended with Potts scissors in the common femoral artery and carried down onto the first 1-2 cm of the superficial femoral artery. An endarterectomy was then performed. The [facility] was used to create a plane. The proximal endpoint was cut flush with tenotomy scissors. This was in the proximal common femoral artery. An eversion endarterectomy was then performed for the first 2-3 cm of the profunda femoris artery. Good backbleeding was then seen. The distal endpoint of the distal common Femoral and most proximal superficial femoral artery endarterectomy was created with gentle traction and the distal endpoint was fairly clean. 7-0 Prolene tacking sutures were used for the distal endpoints. The bovine pericardial patcth is then  selected and prepared for a patch angioplasty.  It is cut and beveled and started at the proximal endpoint with a 6-0 Prolene suture.  Approximately one half of the suture line is run medially and laterally and the distal end point was cut and bevelled to match the arteriotomy.  A second 6-0  Prolene was started at the distal end point and run to the mid portion to complete the arteriotomy.  A small gap was left on the lateral portion of the arteriotomy suture line for sheath placement.    The left femoral artery is then addressed. Arteriotomy is made in the common femoral artery and extended down into the most proximal portion of the superficial femoral artery. Similarly, an endarterectomy was performed with the [facility]. The proximal endpoint was cut flush with tenotomy scissors in the proximal common femoral artery.  The proximal portion of the profunda femoris artery was addressed and treated with an eversion endarterectomy and this was performed with a hemostat and gentle traction.  A series of 7-0 Prolene tacking sutures were used to tack down the endpoint of the profunda femoris artery endarterectomy.  The arteriotomy was carried down onto the proximal superficial femoral artery and the endarterectomy was continued to this point. The distal endpoint was created with gentle traction and then tacked down with a total of three 7-0 Prolene sutures. The bovine pericardial patch was then brought onto the field.  It is cut and beveled and started at the proximal endpoint with a 6-0 Prolene suture.  Approximately one half of the suture line is run medially and laterally and the distal end point was cut and bevelled to match the arteriotomy.  A second 6-0 Prolene was started at the distal end point and run to the mid portion to complete the arteriotomy, leaving a gap on the lateral suture line for sheath placement.     Attention was then turned to the endovascular portion of the procedure.  With myself working on the right and Dr. Prescilla Brod working on the left, 7 [place] sheaths were placed in both of the gaps left and the arteriotomy with vessel loop pulled up for control around the sheath.  Retrograde imaging performed through the femoral sheath showed stenosis in the left external iliac artery  approaching 90% particularly in the proximal external iliac artery.  The right external iliac artery was occluded.  Both common iliac arteries and the aorta were fairly normal.  I was able to cross the occlusion in the right external iliac artery and confirm intraluminal flow up in the aorta.  I then placed a 0.014 wire.  Dr. Prescilla Brod easily crossed his lesion on the left and exchanged for a 0.014 wire on the left side as well.  Due to the dense calcific disease in the external iliac arteries, we felt using the shockwave device for lithotripsy would be of benefit on each side.  We began working on the right side.  A 6 mm diameter by 6 cm length shockwave device was then brought onto the field and first placed in the most distal common iliac artery in the proximal portion of the external iliac artery.  This was inflated to 4 atm and then 3 series of pulses were used for lithotripsy action in this location.  The balloon was then deflated and pulled down to the mid and distal external iliac artery on the right and inflated again to 4 atm and an additional 2 series of pulses were used for lithotripsy.  Following  this, there is still significant residual stenosis and disease throughout the external iliac artery on the right.  We elected to place stents.  A 9 mm diameter by 4 cm length life star stent was placed from the distal common iliac artery down to the proximal external iliac artery to bridge over the hypogastric artery.  An 8 mm diameter by 7.5 cm length Viabahn stent was then deployed in the right external iliac artery but this came up just short of the end of the lesion and an additional 8 mm diameter by 2.5 cm length Viabahn stent was then deployed in the most distal right external iliac artery down to the most proximal common femoral artery.  These areas were postdilated with 7 mm diameter Lutonix drug-coated angioplasty balloon with excellent angiographic completion result and less than 10% residual stenosis.   The wire and sheath were then removed from the right side and control was pulled up on the right common femoral artery.  We then turned our attention to the left side.  Similarly, the shockwave lithotripsy device was initially used over the 0.014 wire.  This was taken to the most distal common iliac artery in the proximal portion of the external iliac artery initially.  It was inflated to 4 atm and 3 series of pulses of the lithotripsy action were used in this location.  The balloon was then deflated and pulled down to the mid and distal external iliac artery.  It was again inflated to 4 atm.  An additional 2 series of pulses of lithotripsy were used in the more distal portion of the left external iliac artery.  Imaging following this showed significant residual disease throughout much of the left external iliac artery up to its origin.  Again, stent was appropriate.  A 9 mm diameter by 4 cm length life star stent was placed bridging over the hypogastric artery to the most distal common iliac artery and the proximal portion of the external iliac artery.  An 8 mm diameter by 10 cm length Viabahn stent was then deployed from the proximal external iliac artery down to the top of the femoral head.  The stents were postdilated with 7 mm diameter Lutonix drug-coated balloon with excellent angiographic completion result and less than 10% residual stenosis.  The wire and sheath were then removed from the left femoral artery and control was achieved with Vesseloops.  Attention was turned to the right femoral artery for completion of the anastomosis.  The vessel was flushed prior to release of control and completion of the anastomosis of the right femoral artery.  At this point, flow was established first to the profunda femoris artery and then to the superficial femoral artery. Easily palpable pulses are noted well beyond the anastomosis and both arteries. Attention is then turned to the left femoral artery for completion  of the anastomosis. Flushing maneuvers were performed and flow was reestablished to the femoral vessels. Excellent pulses noted in the left superficial femoral and profunda femoris artery below the femoral anastomosis.  Fibrillar and Vistacel topical hemostatic agents were placed in the femoral incisions and hemostasis was complete.  Vancomycin  and gentamicin  impregnated stimulant beads were placed in both wounds.  The femoral incisions were then closed in a layered fashion with 2 layers of 2-0 Vicryl, 2 layers of 3-0 Vicryl, and staples for the skin closure.  Incisional (disposable) VAC dressings were placed on both groin wounds.  The suction sponge was placed down and a good occlusive seal was obtained  when connected to suction tubing. The patient was then awakened from anesthesia and taken to the recovery room in stable condition having tolerated the procedure well.  COMPLICATIONS: None  CONDITION: Stable     Mikki Alexander 08/30/2023 6:19 PM  This note was created with Dragon Medical transcription system. Any errors in dictation are purely unintentional.

## 2023-08-30 NOTE — Progress Notes (Signed)
 Patient awake/alert x4.  Bilateral groin dressings with prevena:  intact/functioning. No s/s hematoma noted bilateral.  Pedal pulses with doppler present, examined with ICU RN. Patient states LLE "I can tell a different, feels better" Dr. Prescilla Brod in at bedside, [facility] 30 degree's and can a "few" ice chips.  Per orders NPO till 2230. Vitals stable, afebrile

## 2023-08-30 NOTE — Op Note (Signed)
 OPERATIVE NOTE   PROCEDURE: Bilateral common femoral and profunda femoris endarterectomy with bovine pericardial patch angioplasty. Introduction catheter into the aorta bilateral common femoral artery approach. Shockwave lithotripsy to the bilateral external iliac arteries. Stent placement to the external iliac arteries bilaterally. Application of a disposable Prevena incisional VAC  PRE-OPERATIVE DIAGNOSIS: Atherosclerotic occlusive disease bilateral lower extremities with lifestyle limiting claudication and severe rest pain symptoms; hypertension; diabetes mellitus  POST-OPERATIVE DIAGNOSIS: Same  CO-SURGEON: Jackquelyn Mass, MD and [employer], M.D.  ASSISTANT(S): None  ANESTHESIA: general  ESTIMATED BLOOD LOSS: 400 cc  FINDING(S): Profound calcific plaque noted bilaterally extending past the initial bifurcation of the profunda femoris arteries as well as the origin of the SFA  SPECIMEN(S):  Calcific plaque from the common femoral, superficial femoral and the profunda femoris arteries bilaterally  INDICATIONS:   Kristian Petty 58 y.o. y.o.male who presents with complaints of lifestyle limiting claudication and pain continuously in the feet bilaterally. The patient has documented severe atherosclerotic occlusive disease with noninvasive studies suggesting critical limb ischemia. However, at this point his primary area of stricture stenosis resides in the common femoral and origins of the superficial femoral and profunda femoris extending into these arteries and therefore this is not amenable to intervention and he is now undergoing open endarterectomy. The risks and benefits of been reviewed with the patient, all questions have answered; alternative therapies have been reviewed as well and the patient has agreed to proceed with surgical open repair.  DESCRIPTION: After obtaining full informed written consent, the patient was brought back to the operating room and placed  supine upon the operating table.  The patient received IV antibiotics prior to induction.  After obtaining adequate anesthesia, the patient was prepped and draped in the standard fashion for: bilateral femoral exposure.    Co-surgeons are required because this is a very complex bilateral procedure with work being performed simultaneously from both the right femoral and left femoral approach.  This also expedites the procedure making a shorter operative time reducing complications and improving patient safety.  Attention was turned to the bilateral groin with Dr. Vonna Guardian working on the patient's right and myself working on the left of the patient.  Vertical  incisions were made over the common femoral artery and dissected down to the common femoral artery with electrocautery.  I dissected out the common femoral artery from the distal external iliac artery (identified by the superficial circumflex vessels) down to the femoral bifurcation.  On initial inspection, the common femoral artery was: Densely calcified and there was no palpable pulse noted bilaterally.    Subsequently the dissection was continued to include all circumflex branches and the profunda femoral artery and superficial femoral artery. The superficial femoral artery was dissected circumferentially for a distance of approximately 3-4 cm and the profunda femoris was dissected circumferentially out to the fourth order branches individual vessel loops were placed around each branch. Both of the groins were treated simultaneously as described above. Control of all branches was obtained with vessel loops.  A softer area in the distal external iliac artery amendable to clamping was identified.  The patient was given 7000 units of Heparin  intravenously (an additional 2000 units was given later in the case), which was a therapeutic bolus.   After waiting 3 minutes, the right distal external iliac artery was clamped and we placed all the Silastic Vesseloops on  the circumflex branches, the profunda and superficial femoral arteries under tension.  Arteriotomy was made in the  right common femoral artery with a 11-blade and extended it with a Potts scissor proximally and distally extending the distal end down the SFA for approximately 1-2 cm.   Endarterectomy was then performed under direct visualization using a freer elevator and a right angle from the mid common femoral extending up both proximally and distally. Proximally the endarterectomy was brought up to the level of the clamp where a clean edge was obtained. Distally the endarterectomy was carried down to a soft spot in the SFA where a feathered edge was obtained.  7-0 Prolene interrupted tacking sutures were placed to secure the leading edge of the plaque in the SFA.  The right profunda femoris was treated with an eversion technique extending the endarterectomy approximately 2 cm distally again obtaining a featheredge.   At this point, we fashioned a bovine pericardial patch for the geometry of the arteriotomy.  The patch was sewn to the artery with 2 running stitches of 6-0 Prolene, running from each end.  A Was then left in the lateral suture line to allow for placement of the sheath for the interventional portion of the procedure.  At this point attention was turned to the opposite groin.  The left distal external iliac artery was clamped and we placed all the Silastic Vesseloops on the circumflex branches, the profunda and superficial femoral arteries under tension.  Arteriotomy was made in the left common femoral artery with a 11-blade and extended it with a Potts scissor proximally and distally extending the distal end down the SFA for approximately 3 cm.   Endarterectomy was then performed under direct visualization using a freer elevator and a right angle from the mid common femoral extending up both proximally and distally. Proximally the endarterectomy was brought up to the level of the clamp  where a clean edge was obtained. Distally the endarterectomy was carried down to a soft spot in the SFA where a feathered edge would was obtained.  7-0 Prolene interrupted tacking sutures were placed to secure the leading edge of the plaque in the SFA.  The left profunda femoris was treated with an eversion technique extending the endarterectomy approximately 2 cm distally again obtaining a featheredge.  7-0 Prolene tacking sutures were used to secure the leading edge of the intima.  At this point, we fashioned a bovine pericardial patch for the geometry of the arteriotomy.  The patch was sewn to the artery with 2 running stitches of 6-0 Prolene, running from each end.  Again a gap was left in the lateral wall to allow for placement of the sheath for the interventional portion.  We then began the interventional portion of the case.  The C arm was then prepped and brought into the field.  With Dr. Vonna Guardian working on the right and myself working on the left we were able to place 7 [place] sheaths in a retrograde fashion.  With myself working on the left the wire and catheter were advanced into the aorta.  The catheter was then exchanged for a pigtail catheter and injection of contrast was used to demonstrate the aortogram and common iliac artery anatomy.  A 0.014 wire was then introduced through the pigtail catheter.  Dr. Vonna Guardian was able to easily cross the right external iliac artery occlusion using an advantage wire and a Kumpe catheter.  The Kumpe catheter was then advanced into the aorta and injection of contrast demonstrated intraluminal positioning.  A 0.014 wire was then introduced.  At this point we felt that given  the calcific nature of the patient's disease that lithoangioplasty would improve the patient's outcome.  A 6 mm x 60 mm shockwave balloon angioplasty catheter was then advanced first up the right side.  The entire length of the right external iliac artery was treated.  The balloon was inflated to 4  atm and we performed 310-second treatments in the proximal half of the external iliac.  The balloon was then deflated repositioned to the distal external iliac artery and 3 treatments were performed each of 10 seconds.  The shockwave catheter was then removed from the right and then I advanced the catheter up the left side and in similar fashion we performed to 10-second treatments proximally and 2/10-second treatments distally.  Follow-up imaging now demonstrated a marked improvement in the lumen with significant remodeling of the calcifications but greater than 50% residual stenosis still exists particularly at the origin of the external iliacs.  Returning to the right side Dr. Vonna Guardian placed a 9 x 40 life star stent across the origin of the external iliac artery and then a 8 mm x 7.5 cm Viabahn stent was deployed.  These were then postdilated with a 7 mm x 60 mm Lutonix drug-eluting balloon.  Inflation was to 8 to 10 atm for approximately 1 minute.  Follow-up imaging demonstrated that the Viabahn stent did not quite reach the common femoral artery and greater than 50% residual stenosis was located in the uncovered portion of the external iliac artery and therefore an 8 mm x 2.5 cm Viabahn stent was used to extend and postdilated with a 7 mm x 60 mm balloon inflated to 10 atm for 1 minute.  Follow-up imaging now demonstrated wide patency of the external iliac artery down to the common femoral with less than 5% residual stenosis.  Attention was now turned to the left side.  With myself working on the left again a 9 x 40 life star stent was deployed across the origin of the external iliac artery and then a 8 mm x 10 cm Viabahn stent was deployed down to the level of the ilioinguinal ligament.  This was then postdilated with a 7 mm x 60 mm Lutonix drug-eluting balloon proximally.  Same balloon was then used to post dilate distally as well.  Follow-up imaging now demonstrated less than 5% residual stenosis with wide  patency of the external iliac artery throughout its entire course down to the common femoral.  At this point we elected to close the arteriotomies and complete the case.  Both of the common femoral endarterectomy sites were then forward flushed as well as retrograde flushed and irrigated copiously with heparinized saline.  The left and then the right patch angioplasty suture line was completed in the usual fashion.  Flow was then reestablished first to the profunda femoris and then the superficial femoral artery. Any gaps or bleeding sites in the suture line were easily controlled with a 6-0 Prolene suture. Doppler is then delivered onto the field and the SFA as well as the profunda femoris arteries were interrogated and found to have triphasic Doppler signals.  Both right and left groins were then irrigated copiously with sterile saline.  Antibiotic beads using a gram of vancomycin  and 80 mg of gentamicin  were reconstituted on the back table and placed in the bed of both groin incisions and subsequently Vistaseal  and fibrillar were placed in the wound. The incision was repaired with a double layer of 2-0 Vicryl, a double layer of 3-0 Vicryl, and the skin  was closed with staples.  Prevena disposable vacs were then applied to both groin incisions.  COMPLICATIONS: None  CONDITION: Kaylyn Paschal, M.D. [facility] Office: [phone]  08/30/2023, 6:37 PM

## 2023-08-30 NOTE — Anesthesia Procedure Notes (Signed)
 Arterial Line Insertion Start/End1/15/2025 1:55 PM Performed by: Baltazar Bonier, MD, Wilkins Hardy I, CRNA, CRNA  Patient location: OR. Preanesthetic checklist: patient identified, IV checked, site marked, risks and benefits discussed, surgical consent, monitors and equipment checked, pre-op evaluation, timeout performed and anesthesia consent Patient sedated Left, radial was placed Catheter size: 20 G Hand hygiene performed  and maximum sterile barriers used  Allen's test indicative of satisfactory collateral circulation Attempts: 1 Procedure performed without using ultrasound guided technique. Following insertion, dressing applied. Post procedure assessment: normal  Patient tolerated the procedure well with no immediate complications.

## 2023-08-30 NOTE — Progress Notes (Signed)
 eLink Physician-Brief Progress Note Patient Name: Jason Livingston DOB: [birth year] MRN: [id number]   Date of Service  08/30/2023  HPI/Events of Note  S/p BL CFA endarterectomy for rest pain Cr 0.8  eICU Interventions  BP monitoring -On labetalol  & losartan       Intervention Category Evaluation Type: New Patient Evaluation  Jason Livingston 08/30/2023, 8:24 PM

## 2023-08-30 NOTE — Interval H&P Note (Signed)
 History and Physical Interval Note:  08/30/2023 8:58 AM  Jason Livingston  has presented today for surgery, with the diagnosis of ASO WITH REST PAIN.  The various methods of treatment have been discussed with the patient and family. After consideration of risks, benefits and other options for treatment, the patient has consented to  Procedure(s): ENDARTERECTOMY FEMORAL (Bilateral) INSERTION OF ILIAC STENT (Left) APPLICATION OF CELL SAVER (N/A) as a surgical intervention.  The patient's history has been reviewed, patient examined, no change in status, stable for surgery.  I have reviewed the patient's chart and labs.  Questions were answered to the patient's satisfaction.     Devon Fogo

## 2023-08-30 NOTE — Transfer of Care (Signed)
 Immediate Anesthesia Transfer of Care Note  Patient: Jason Livingston  Procedure(s) Performed: ENDARTERECTOMY FEMORAL (Bilateral) INSERTION OF ILIAC STENT APPLICATION OF CELL SAVER  Patient Location: PACU  Anesthesia Type:General  Level of Consciousness: awake, alert , and oriented  Airway & Oxygen Therapy: Patient Spontanous Breathing and Patient connected to face mask oxygen  Post-op Assessment: Report given to RN, Post -op Vital signs reviewed and stable, and Patient moving all extremities  Post vital signs: Reviewed and stable  Last Vitals:  Vitals Value Taken Time  BP 111/85 08/30/23 1825  Temp    Pulse 84 08/30/23 1830  Resp 14 08/30/23 1830  SpO2 92 % 08/30/23 1830  Vitals shown include unfiled device data.  Last Pain:  Vitals:   08/30/23 0855  TempSrc: Temporal  PainSc: 8          Complications: No notable events documented.

## 2023-08-31 ENCOUNTER — Inpatient Hospital Stay: Payer: Medicaid Other

## 2023-08-31 LAB — CBC
HCT: 40.8 % (ref 39.0–52.0)
Hemoglobin: 13.9 g/dL (ref 13.0–17.0)
MCH: 31.7 pg (ref 26.0–34.0)
MCHC: 34.1 g/dL (ref 30.0–36.0)
MCV: 93.2 fL (ref 80.0–100.0)
Platelets: 255 10*3/uL (ref 150–400)
RBC: 4.38 MIL/uL (ref 4.22–5.81)
RDW: 13 % (ref 11.5–15.5)
WBC: 18.5 10*3/uL — ABNORMAL HIGH (ref 4.0–10.5)
nRBC: 0 % (ref 0.0–0.2)

## 2023-08-31 LAB — BASIC METABOLIC PANEL
Anion gap: 10 (ref 5–15)
BUN: 13 mg/dL (ref 6–20)
CO2: 21 mmol/L — ABNORMAL LOW (ref 22–32)
Calcium: 8.6 mg/dL — ABNORMAL LOW (ref 8.9–10.3)
Chloride: 107 mmol/L (ref 98–111)
Creatinine, Ser: 0.76 mg/dL (ref 0.61–1.24)
GFR, Estimated: >60 mL/min (ref >60)
Glucose, Bld: 137 mg/dL — ABNORMAL HIGH (ref 70–99)
Potassium: 4.1 mmol/L (ref 3.5–5.1)
Sodium: 138 mmol/L (ref 135–145)

## 2023-08-31 LAB — GLUCOSE, CAPILLARY
Glucose-Capillary: 107 mg/dL — ABNORMAL HIGH (ref 70–99)
Glucose-Capillary: 145 mg/dL — ABNORMAL HIGH (ref 70–99)
Glucose-Capillary: 181 mg/dL — ABNORMAL HIGH (ref 70–99)
Glucose-Capillary: 112 mg/dL — ABNORMAL HIGH (ref 70–99)

## 2023-08-31 NOTE — Evaluation (Signed)
Physical Therapy Evaluation Patient Details Name: Jason Livingston MRN: [id number] DOB: 06-09-1966 Today's Date: 08/31/2023  History of Present Illness  Pt is a 58 y/o M admitted on 08/30/23 for scheduled bilateral femoral endarterectomy & insertion of iliac stent. PMH: arthritis, asthma, GERD, HTN, pre-diabetes, sleep apnea  Clinical Impression  Pt seen for PT evaluation with pt agreeable. Pt reports prior to admission he was independent without AD. On this date, pt reports "stabbing" proximal R knee pain - MD & nurse made aware. Pt is able to progress to ambulating in hallway with RW & CGA with cuing re: use of AD but still limited by R knee pain. Pt would benefit from ongoing skilled PT treatment to increase independence with mobility & reduce fall risk prior to d/c.     If plan is discharge home, recommend the following: A little help with walking and/or transfers;A little help with bathing/dressing/bathroom;Assistance with cooking/housework;Assist for transportation;Help with stairs or ramp for entrance   Can travel by private vehicle        Equipment Recommendations Rolling walker (2 wheels)  Recommendations for Other Services       Functional Status Assessment Patient has had a recent decline in their functional status and demonstrates the ability to make significant improvements in function in a reasonable and predictable amount of time.     Precautions / Restrictions Precautions Precautions: Fall Restrictions Weight Bearing Restrictions Per Provider Order: No      Mobility  Bed Mobility Overal bed mobility: Needs Assistance Bed Mobility: Supine to Sit     Supine to sit: Supervision, HOB elevated, Used rails          Transfers Overall transfer level: Needs assistance Equipment used: Rolling walker (2 wheels) Transfers: Sit to/from Stand Sit to Stand: Contact guard assist           General transfer comment: STS, cuing for leg placement, hand placement to  decrease pain in R knee with transitional movement    Ambulation/Gait Ambulation/Gait assistance: Contact guard assist Gait Distance (Feet):  (12 + 50 ft) Assistive device: Rolling walker (2 wheels) Gait Pattern/deviations: Decreased step length - right, Decreased step length - left, Decreased stride length Gait velocity: decreased     General Gait Details: Cuing re: need to amulate within base fo AD, shoulder depression as pt with BLE shoulders elevated, upright head with forward gaze  Stairs            Wheelchair Mobility     Tilt Bed    Modified Rankin (Stroke Patients Only)       Balance Overall balance assessment: Needs assistance Sitting-balance support: Feet supported Sitting balance-Leahy Scale: Good     Standing balance support: During functional activity, Bilateral upper extremity supported, Reliant on assistive device for balance Standing balance-Leahy Scale: Fair                               Pertinent Vitals/Pain Pain Assessment Pain Assessment: Faces Faces Pain Scale: Hurts whole lot Pain Location: R knee with flexion primary source of pain, pt also c/o some soreness in B groins but not as much as R knee with flexion Pain Descriptors / Indicators: Stabbing Pain Intervention(s): Monitored during session, Limited activity within patient's tolerance, Repositioned    Home Living Family/patient expects to be discharged to:: Private residence Living Arrangements: Alone Available Help at Discharge: Friend(s);Available PRN/intermittently Type of Home: House Home Access: Ramped entrance;Stairs to enter  Entrance Stairs-Number of Steps: 5 - reports does not enter as he doesnt have a key to front door   Home Layout: One level Home Equipment: None Additional Comments: claw foot tub shower    Prior Function Prior Level of Function : Independent/Modified Independent             Mobility Comments: ambulatory without AD        Extremity/Trunk Assessment   Upper Extremity Assessment Upper Extremity Assessment: Overall WFL for tasks assessed    Lower Extremity Assessment Lower Extremity Assessment: RLE deficits/detail RLE Deficits / Details: AROM WFL, reports pain limiting active knee flexion, no c/o pain with PROM       Communication   Communication Communication: No apparent difficulties  Cognition Arousal: Alert Behavior During Therapy: WFL for tasks assessed/performed Overall Cognitive Status: Within Functional Limits for tasks assessed                                          General Comments General comments (skin integrity, edema, etc.): Educated pt on wound vacs    Exercises     Assessment/Plan    PT Assessment Patient needs continued PT services  PT Problem List Decreased strength;Pain;Decreased range of motion;Decreased activity tolerance;Decreased balance;Decreased mobility;Decreased knowledge of use of DME       PT Treatment Interventions Balance training;DME instruction;Gait training;Neuromuscular re-education;Stair training;Functional mobility training;Therapeutic activities;Therapeutic exercise;Manual techniques;Patient/family education;Modalities    PT Goals (Current goals can be found in the Care Plan section)  Acute Rehab PT Goals Patient Stated Goal: decreased pain PT Goal Formulation: With patient Time For Goal Achievement: 09/14/23 Potential to Achieve Goals: Good    Frequency Min 1X/week     Co-evaluation PT/OT/SLP Co-Evaluation/Treatment: Yes Reason for Co-Treatment: Other (comment) (2/2 pt's c/o pain) PT goals addressed during session: Mobility/safety with mobility;Balance;Proper use of DME         AM-PAC PT "6 Clicks" Mobility  Outcome Measure Help needed turning from your back to your side while in a flat bed without using bedrails?: None Help needed moving from lying on your back to sitting on the side of a flat bed without using  bedrails?: A Little Help needed moving to and from a bed to a chair (including a wheelchair)?: A Little Help needed standing up from a chair using your arms (e.g., wheelchair or bedside chair)?: A Little Help needed to walk in hospital room?: A Little Help needed climbing 3-5 steps with a railing? : A Little 6 Click Score: 19    End of Session   Activity Tolerance: Patient tolerated treatment well;Patient limited by pain Patient left: in chair;with call bell/phone within reach Nurse Communication: Mobility status PT Visit Diagnosis: Pain;Difficulty in walking, not elsewhere classified (R26.2);Other abnormalities of gait and mobility (R26.89);Muscle weakness (generalized) (M62.81) Pain - Right/Left: Right Pain - part of body: Knee    Time: 1010-1031 PT Time Calculation (min) (ACUTE ONLY): 21 min   Charges:   PT Evaluation $PT Eval Moderate Complexity: 1 Mod   PT General Charges $$ ACUTE PT VISIT: 1 Visit         Aleda Grana, PT, DPT 08/31/23, 10:58 AM   Sandi Mariscal 08/31/2023, 10:56 AM

## 2023-08-31 NOTE — Plan of Care (Signed)
  Problem: Education: Goal: Knowledge of General Education information will improve Description: Including pain rating scale, medication(s)/side effects and non-pharmacologic comfort measures Outcome: Progressing   Problem: Clinical Measurements: Goal: Ability to maintain clinical measurements within normal limits will improve Outcome: Progressing Goal: Respiratory complications will improve Outcome: Progressing Goal: Cardiovascular complication will be avoided Outcome: Progressing   Problem: Activity: Goal: Risk for activity intolerance will decrease Outcome: Progressing   Problem: Nutrition: Goal: Adequate nutrition will be maintained Outcome: Progressing   Problem: Coping: Goal: Level of anxiety will decrease Outcome: Progressing   Problem: Elimination: Goal: Will not experience complications related to bowel motility Outcome: Progressing   Problem: Pain Managment: Goal: General experience of comfort will improve and/or be controlled Outcome: Progressing   Problem: Safety: Goal: Ability to remain free from injury will improve Outcome: Progressing

## 2023-08-31 NOTE — Anesthesia Postprocedure Evaluation (Signed)
Anesthesia Post Note  Patient: Jason Livingston  Procedure(s) Performed: ENDARTERECTOMY FEMORAL (Bilateral) INSERTION OF ILIAC STENT APPLICATION OF CELL SAVER  Patient location during evaluation: ICU Anesthesia Type: General Level of consciousness: awake Pain management: pain level controlled Respiratory status: spontaneous breathing Cardiovascular status: stable Anesthetic complications: no   No notable events documented.   Last Vitals:  Vitals:   08/31/23 0600 08/31/23 0700  BP: 115/66 107/61  Pulse: 69 71  Resp: 10 12  Temp:    SpO2: 94% 92%    Last Pain:  Vitals:   08/31/23 0716  TempSrc:   PainSc: 0-No pain                 Jaye Beagle

## 2023-08-31 NOTE — Evaluation (Signed)
Occupational Therapy Evaluation Patient Details Name: Garwood Rumbley MRN: [id number] DOB: 1966/03/12 Today's Date: 08/31/2023   History of Present Illness Pt is a 58 y/o M admitted on 08/30/23 for scheduled bilateral femoral endarterectomy & insertion of iliac stent. PMH: arthritis, asthma, GERD, HTN, pre-diabetes, sleep apnea   Clinical Impression   Mr Howden was seen for OT evaluation this date. Prior to hospital admission, pt was IND. Pt lives alone. Pt currently requires MAX A don B socks in sitting, groin and R knee pain limiting. CGA + RW for ADL t/f ~50 ft. Pt would benefit from skilled OT to address noted impairments and functional limitations (see below for any additional details). Upon hospital discharge, recommend HHOT follow up and assist for IADLs.    If plan is discharge home, recommend the following: Help with stairs or ramp for entrance;Assistance with cooking/housework    Functional Status Assessment  Patient has had a recent decline in their functional status and demonstrates the ability to make significant improvements in function in a reasonable and predictable amount of time.  Equipment Recommendations  BSC/3in1;Other (comment) (RW)    Recommendations for Other Services       Precautions / Restrictions Precautions Precautions: Fall Restrictions Weight Bearing Restrictions Per Provider Order: No      Mobility Bed Mobility Overal bed mobility: Modified Independent             General bed mobility comments: bed rail use    Transfers Overall transfer level: Needs assistance Equipment used: Rolling walker (2 wheels) Transfers: Sit to/from Stand Sit to Stand: Supervision                  Balance Overall balance assessment: Needs assistance Sitting-balance support: No upper extremity supported, Feet supported Sitting balance-Leahy Scale: Normal     Standing balance support: Single extremity supported, During functional activity Standing  balance-Leahy Scale: Fair                             ADL either performed or assessed with clinical judgement   ADL Overall ADL's : Needs assistance/impaired                                       General ADL Comments: MAX A Don B socks in sitting. CGA + RW for ADL t/f ~50 ft.      Pertinent Vitals/Pain Pain Assessment Pain Assessment: 0-10 Pain Score: 10-Worst pain ever Pain Location: R knee with flexion Pain Descriptors / Indicators: Sharp, Shooting Pain Intervention(s): Limited activity within patient's tolerance, Patient requesting pain meds-RN notified     Extremity/Trunk Assessment Upper Extremity Assessment Upper Extremity Assessment: Overall WFL for tasks assessed   Lower Extremity Assessment Lower Extremity Assessment: RLE deficits/detail RLE Deficits / Details: AROM WFL, reports pain limiting active knee flexion, no c/o pain with PROM       Communication Communication Communication: No apparent difficulties   Cognition Arousal: Alert Behavior During Therapy: WFL for tasks assessed/performed Overall Cognitive Status: Within Functional Limits for tasks assessed                                                  Home Living Family/patient expects to be discharged to:: Private  residence Living Arrangements: Alone Available Help at Discharge: Friend(s);Available PRN/intermittently Type of Home: House Home Access: Ramped entrance;Stairs to enter Entrance Stairs-Number of Steps: 5 - reports does not enter as he doesnt have a key to front door   Home Layout: One level     Bathroom Shower/Tub: Tub/shower unit (claw foot tub)   Bathroom Toilet: Standard     Home Equipment: None   Additional Comments: claw foot tub shower      Prior Functioning/Environment Prior Level of Function : Independent/Modified Independent             Mobility Comments: ambulatory without AD          OT Problem List:  Decreased strength;Decreased range of motion      OT Treatment/Interventions: Self-care/ADL training;Therapeutic exercise;Energy conservation;DME and/or AE instruction    OT Goals(Current goals can be found in the care plan section) Acute Rehab OT Goals Patient Stated Goal: to go home OT Goal Formulation: With patient Time For Goal Achievement: 09/14/23 Potential to Achieve Goals: Good ADL Goals Pt Will Perform Grooming: with modified independence;standing Pt Will Perform Lower Body Dressing: with modified independence;sit to/from stand Pt Will Transfer to Toilet: with modified independence;ambulating;regular height toilet  OT Frequency: Min 1X/week    Co-evaluation              AM-PAC OT "6 Clicks" Daily Activity     Outcome Measure Help from another person eating meals?: None Help from another person taking care of personal grooming?: A Little Help from another person toileting, which includes using toliet, bedpan, or urinal?: A Little Help from another person bathing (including washing, rinsing, drying)?: A Little Help from another person to put on and taking off regular upper body clothing?: None Help from another person to put on and taking off regular lower body clothing?: A Lot 6 Click Score: 19   End of Session Equipment Utilized During Treatment: Rolling walker (2 wheels) Nurse Communication: Patient requests pain meds  Activity Tolerance: Patient tolerated treatment well Patient left: in chair;with call bell/phone within reach  OT Visit Diagnosis: Other abnormalities of gait and mobility (R26.89)                Time: 1610-9604 OT Time Calculation (min): 22 min Charges:  OT General Charges $OT Visit: 1 Visit OT Evaluation $OT Eval Moderate Complexity: 1 Mod  Kathie Dike, M.S. OTR/L  08/31/23, 10:53 AM  ascom [phone]

## 2023-08-31 NOTE — Plan of Care (Signed)

## 2023-08-31 NOTE — Progress Notes (Signed)
  Progress Note    08/31/2023 7:35 AM 1 Day Post-Op  Subjective:  Jason Livingston is a 58 yo male who is now POD #1 from bilateral common femoral and profunda femoris endarterectomies, aortogram with iliofemoral angiograms with angioplasty and lithotripsy to bilateral external iliac arteries.  Patient also had stent placements to right external iliac artery and left external iliac artery.  On exam this morning patient is resting comfortably in bed eating breakfast. Endorses soreness to both his groins. Recovering as expected. No complaints overnight. Vitals all remain stable.    Vitals:   08/31/23 0600 08/31/23 0700  BP: 115/66 107/61  Pulse: 69 71  Resp: 10 12  Temp:    SpO2: 94% 92%   Physical Exam: Cardiac:  RRR, normal S1 and S2, no rubs clicks or gallops. Lungs: Clear on auscultation throughout but diminished in the bases. Incisions: Bilateral groin incisions with Prevena wound vacs in place. Working well Extremities:  Bilateral lower extremities warm to touch with doppler DP/PT pulses.  Abdomen:  Positive bowel sounds, soft non tender and non distended.  Neurologic: AAOX3, answers all questions and follows commands appropriately.   CBC    Component Value Date/Time   WBC 18.5 (H) 08/31/2023 0029   RBC 4.38 08/31/2023 0029   HGB 13.9 08/31/2023 0029   HGB 14.4 01/05/2023 1127   HCT 40.8 08/31/2023 0029   HCT 41.7 01/05/2023 1127   PLT 255 08/31/2023 0029   PLT 309 01/05/2023 1127   MCV 93.2 08/31/2023 0029   MCV 98 (H) 01/05/2023 1127   MCH 31.7 08/31/2023 0029   MCHC 34.1 08/31/2023 0029   RDW 13.0 08/31/2023 0029   RDW 13.1 01/05/2023 1127   LYMPHSABS 2.6 08/29/2023 1516   LYMPHSABS 1.7 01/05/2023 1127   MONOABS 0.4 08/29/2023 1516   EOSABS 0.2 08/29/2023 1516   EOSABS 0.1 01/05/2023 1127   BASOSABS 0.0 08/29/2023 1516   BASOSABS 0.1 01/05/2023 1127    BMET    Component Value Date/Time   NA 138 08/31/2023 0029   NA 139 05/10/2023 1632   K 4.1 08/31/2023  0029   CL 107 08/31/2023 0029   CO2 21 (L) 08/31/2023 0029   GLUCOSE 137 (H) 08/31/2023 0029   BUN 13 08/31/2023 0029   BUN 10 05/10/2023 1632   CREATININE 0.76 08/31/2023 0029   CALCIUM 8.6 (L) 08/31/2023 0029   GFRNONAA >60 08/31/2023 0029    INR No results found for: "INR"   Intake/Output Summary (Last 24 hours) at 08/31/2023 0735 Last data filed at 08/31/2023 0700 Gross per 24 hour  Intake 4021.31 ml  Output 2950 ml  Net 1071.31 ml     Assessment/Plan:  58 y.o. male is s/p  bilateral common femoral and profunda femoris endarterectomies, aortogram with iliofemoral angiograms with angioplasty and lithotripsy to bilateral external iliac arteries.  Patient also had stent placements to right external iliac artery and left external iliac artery. 1 Day Post-Op   PLAN Advance diet as tolerated Pain medication PRN Remove foley catheter Remove Arterial Blood Pressure monitoring line PT/OT consults OOB to chair today Incentive spirometry Q1 hour while awake.  DISPO- to the floor later today if does well.  Start ASA 81 mg daily, Plavix 75 mg daily   DVT prophylaxis:  ASA 81 mg daily, Plavix 75 mg daily and Lovenox 40 mg Q 24   Jannell Franta R Farhad Burleson [facility] 08/31/2023 7:35 AM

## 2023-09-01 LAB — GLUCOSE, CAPILLARY
Glucose-Capillary: 101 mg/dL — ABNORMAL HIGH (ref 70–99)
Glucose-Capillary: 136 mg/dL — ABNORMAL HIGH (ref 70–99)
Glucose-Capillary: 148 mg/dL — ABNORMAL HIGH (ref 70–99)
Glucose-Capillary: 122 mg/dL — ABNORMAL HIGH (ref 70–99)

## 2023-09-01 LAB — SURGICAL PATHOLOGY

## 2023-09-01 MED ORDER — PANTOPRAZOLE SODIUM 40 MG PO TBEC
40.0000 mg | DELAYED_RELEASE_TABLET | Freq: Every day | ORAL | Status: DC
Start: 2023-09-01 — End: 2023-09-04
  Administered 2023-09-01 – 2023-09-03 (×3): 40 mg via ORAL
  Filled 2023-09-01 (×3): qty 1

## 2023-09-01 NOTE — Plan of Care (Signed)
  Problem: Health Behavior/Discharge Planning: Goal: Ability to manage health-related needs will improve Outcome: Progressing   Problem: Clinical Measurements: Goal: Will remain free from infection Outcome: Progressing Goal: Diagnostic test results will improve Outcome: Progressing   Problem: Activity: Goal: Risk for activity intolerance will decrease Outcome: Progressing   Problem: Coping: Goal: Level of anxiety will decrease Outcome: Progressing   Problem: Elimination: Goal: Will not experience complications related to bowel motility Outcome: Progressing Goal: Will not experience complications related to urinary retention Outcome: Progressing

## 2023-09-01 NOTE — Plan of Care (Signed)

## 2023-09-01 NOTE — Progress Notes (Signed)
PHARMACIST - PHYSICIAN COMMUNICATION  DR:   Gilda Crease  CONCERNING: IV to Oral Route Change Policy  RECOMMENDATION: This patient is receiving pantoprazole by the intravenous route.  Based on criteria approved by the Pharmacy and Therapeutics Committee, the intravenous medication(s) is/are being converted to the equivalent oral dose form(s).   DESCRIPTION: These criteria include: The patient is eating (either orally or via tube) and/or has been taking other orally administered medications for a least 24 hours The patient has no evidence of active gastrointestinal bleeding or impaired GI absorption (gastrectomy, short bowel, patient on TNA or NPO).  If you have questions about this conversion, please contact the Pharmacy Department  []   [phone] )  [facility] [x]   [phone] )  [facility] []   [phone] )  [facility] []   [phone] )  [facility] []   [phone] )  [facility]   Lowella Bandy, [facility] 09/01/2023 10:20 AM

## 2023-09-01 NOTE — Progress Notes (Signed)
Physical Therapy Treatment Patient Details Name: Jason Livingston MRN: [id number] DOB: [birth year] Today's Date: 09/01/2023   History of Present Illness Pt is a 58 y/o M admitted on 08/30/23 for scheduled bilateral femoral endarterectomy & insertion of iliac stent. PMH: arthritis, asthma, GERD, HTN, pre-diabetes, sleep apnea    PT Comments  Pt seen for PT tx with pt agreeable. Pt reports ongoing R knee pain but per pt, he reports MD notes it's a cyst pt can have drained. On this date, pt is able to complete STS from EOB with min assist but ambulate 1 lap around nurses station with RW & supervision. Pt is still hopeful to d/c home upon d/c. Will continue to follow pt acutely to address balance, strengthening, & gait with LRAD.    If plan is discharge home, recommend the following: A little help with walking and/or transfers;A little help with bathing/dressing/bathroom;Assistance with cooking/housework;Assist for transportation;Help with stairs or ramp for entrance   Can travel by private vehicle        Equipment Recommendations  Rolling walker (2 wheels)    Recommendations for Other Services       Precautions / Restrictions Precautions Precautions: Fall Restrictions Weight Bearing Restrictions Per Provider Order: No     Mobility  Bed Mobility Overal bed mobility: Needs Assistance Bed Mobility: Supine to Sit     Supine to sit: Supervision, HOB elevated, Used rails     General bed mobility comments: uses BUE to assist RLE to EOB PRN    Transfers Overall transfer level: Needs assistance Equipment used: Rolling walker (2 wheels) Transfers: Sit to/from Stand Sit to Stand: Min assist           General transfer comment: multiple attempts for STS from EOB, PT elevates height of bed 2/2 pt's height & c/o R knee pain, cuing re: hand placement, pt eventually able to transfer STS with min assist.    Ambulation/Gait Ambulation/Gait assistance: Supervision Gait Distance (Feet):  175 Feet Assistive device: Rolling walker (2 wheels) Gait Pattern/deviations: Decreased step length - right, Decreased step length - left, Decreased stride length Gait velocity: decreased     General Gait Details: R knee genu varus, question some R ankle supination, pt reports R knee giving out on him 2 times during gait but pt able to correct without assistance. PT provides ongoing cuing re: upright posture vs leaning on RW, relaxed BUE shoulders.   Stairs             Wheelchair Mobility     Tilt Bed    Modified Rankin (Stroke Patients Only)       Balance Overall balance assessment: Needs assistance Sitting-balance support: Feet supported Sitting balance-Leahy Scale: Good     Standing balance support: During functional activity, Bilateral upper extremity supported, Reliant on assistive device for balance                                Cognition Arousal: Alert Behavior During Therapy: WFL for tasks assessed/performed Overall Cognitive Status: Within Functional Limits for tasks assessed                                          Exercises      General Comments        Pertinent Vitals/Pain Pain Assessment Pain Assessment: 0-10 Pain Score: 7  Pain  Location: R knee Pain Descriptors / Indicators: Stabbing Pain Intervention(s): Monitored during session    Home Living                          Prior Function            PT Goals (current goals can now be found in the care plan section) Acute Rehab PT Goals Patient Stated Goal: decreased pain PT Goal Formulation: With patient Time For Goal Achievement: 09/14/23 Potential to Achieve Goals: Good Progress towards PT goals: Progressing toward goals    Frequency    Min 1X/week      PT Plan      Co-evaluation              AM-PAC PT "6 Clicks" Mobility   Outcome Measure  Help needed turning from your back to your side while in a flat bed without using  bedrails?: None Help needed moving from lying on your back to sitting on the side of a flat bed without using bedrails?: A Little Help needed moving to and from a bed to a chair (including a wheelchair)?: A Little Help needed standing up from a chair using your arms (e.g., wheelchair or bedside chair)?: A Little Help needed to walk in hospital room?: A Little Help needed climbing 3-5 steps with a railing? : A Little 6 Click Score: 19    End of Session   Activity Tolerance: Patient tolerated treatment well;Patient limited by fatigue Patient left: in chair;with call bell/phone within reach Nurse Communication: Mobility status PT Visit Diagnosis: Pain;Difficulty in walking, not elsewhere classified (R26.2);Other abnormalities of gait and mobility (R26.89);Muscle weakness (generalized) (M62.81) Pain - Right/Left: Right Pain - part of body: Knee     Time: 1610-9604 PT Time Calculation (min) (ACUTE ONLY): 16 min  Charges:    $Therapeutic Activity: 8-22 mins PT General Charges $$ ACUTE PT VISIT: 1 Visit                     Aleda Grana, PT, DPT 09/01/23, 12:00 PM   Sandi Mariscal 09/01/2023, 11:58 AM

## 2023-09-01 NOTE — Progress Notes (Signed)
  Progress Note    09/01/2023 7:50 AM 2 Days Post-Op  Subjective:  Jason Livingston is a 58 yo male who is now POD #2 from bilateral common femoral and profunda femoris endarterectomies, aortogram with iliofemoral angiograms with angioplasty and lithotripsy to bilateral external iliac arteries.  Patient also had stent placements to right external iliac artery and left external iliac artery.   On exam this morning patient is resting comfortably. Endorses pain to his right knee. Xrays ordered which show fluid collection and possible Bakers Cyst. Endorses soreness to both his groins. Recovering as expected. No complaints overnight. Vitals all remain stable.    Vitals:   09/01/23 0400 09/01/23 0500  BP: 102/63 96/61  Pulse: 78 74  Resp: 10 12  Temp: 97.9 F (36.6 C)   SpO2: 91% 90%   Physical Exam: Cardiac:  RRR, normal S1 and S2, no rubs clicks or gallops. Lungs: Clear on auscultation throughout but diminished in the bases. Incisions: Bilateral groin incisions with Prevena wound vacs in place. Working well Extremities:  Bilateral lower extremities warm to touch with doppler DP/PT pulses.  Abdomen:  Positive bowel sounds, soft non tender and non distended.  Neurologic: AAOX3, answers all questions and follows commands appropriately.   CBC    Component Value Date/Time   WBC 18.5 (H) 08/31/2023 0029   RBC 4.38 08/31/2023 0029   HGB 13.9 08/31/2023 0029   HGB 14.4 01/05/2023 1127   HCT 40.8 08/31/2023 0029   HCT 41.7 01/05/2023 1127   PLT 255 08/31/2023 0029   PLT 309 01/05/2023 1127   MCV 93.2 08/31/2023 0029   MCV 98 (H) 01/05/2023 1127   MCH 31.7 08/31/2023 0029   MCHC 34.1 08/31/2023 0029   RDW 13.0 08/31/2023 0029   RDW 13.1 01/05/2023 1127   LYMPHSABS 2.6 08/29/2023 1516   LYMPHSABS 1.7 01/05/2023 1127   MONOABS 0.4 08/29/2023 1516   EOSABS 0.2 08/29/2023 1516   EOSABS 0.1 01/05/2023 1127   BASOSABS 0.0 08/29/2023 1516   BASOSABS 0.1 01/05/2023 1127    BMET     Component Value Date/Time   NA 138 08/31/2023 0029   NA 139 05/10/2023 1632   K 4.1 08/31/2023 0029   CL 107 08/31/2023 0029   CO2 21 (L) 08/31/2023 0029   GLUCOSE 137 (H) 08/31/2023 0029   BUN 13 08/31/2023 0029   BUN 10 05/10/2023 1632   CREATININE 0.76 08/31/2023 0029   CALCIUM 8.6 (L) 08/31/2023 0029   GFRNONAA >60 08/31/2023 0029    INR No results found for: "INR"   Intake/Output Summary (Last 24 hours) at 09/01/2023 0750 Last data filed at 09/01/2023 0500 Gross per 24 hour  Intake 827.08 ml  Output 2300 ml  Net -1472.92 ml     Assessment/Plan:  58 y.o. male is s/p  bilateral common femoral and profunda femoris endarterectomies, aortogram with iliofemoral angiograms with angioplasty and lithotripsy to bilateral external iliac arteries.  Patient also had stent placements to right external iliac artery and left external iliac artery.  2 Days Post-Op   PLAN Advance diet as tolerated Pain medication PRN PT/OT OOB to chair today Incentive spirometry Q1 hour while awake.  DISPO- to the floor today Consult ortho for knee pain Possible Cyst? ASA 81 mg daily, Plavix 75 mg daily    DVT prophylaxis:  ASA 81 mg daily, Plavix 75 mg daily and Lovenox 40 mg Q 24    Norissa Bartee R Flannery Cavallero [facility] 09/01/2023 7:50 AM

## 2023-09-02 LAB — GLUCOSE, CAPILLARY
Glucose-Capillary: 105 mg/dL — ABNORMAL HIGH (ref 70–99)
Glucose-Capillary: 129 mg/dL — ABNORMAL HIGH (ref 70–99)
Glucose-Capillary: 118 mg/dL — ABNORMAL HIGH (ref 70–99)
Glucose-Capillary: 147 mg/dL — ABNORMAL HIGH (ref 70–99)

## 2023-09-02 NOTE — TOC Initial Note (Addendum)
Transition of Care [facility]) - Initial/Assessment Note    Patient Details  Name: Jason Livingston MRN: [id number] Date of Birth: Nov 22, 1965  [facility] CM/SW Contact:    Liliana Cline, LCSW Phone Number: 09/02/2023, 1:19 PM  Clinical Narrative:            CSW spoke with patient at bedside. Patient is from home alone. PCP is Dr. Payton Mccallum. Pharmacy is [facility]. No HH or DME history. Patient is agreeable to therapy recommendations for Home Health and 3in1. Patient states he understands the RW rec but would like a rollator instead of a rolling walker. Asked MD for orders. Updated TOC handoff to order DME within 48 hours of discharge. Asked Kandee Keen with [facility] if [facility] can [facility] for [facility] - awaiting response. Patient requests transportation resources be added to his AVS, he states he uses the [employer] for transportation typically. Added resources to AVS.       1:37- Kandee Keen with [facility] accepted for HHPT.  Expected Discharge Plan: Home w Home Health Services Barriers to Discharge: Continued Medical Work up   Patient Goals and CMS Choice Patient states their goals for this hospitalization and ongoing recovery are:: home with home health CMS Medicare.gov Compare Post Acute Care list provided to:: Patient Choice offered to / list presented to : Patient      Expected Discharge Plan and Services       Living arrangements for the past 2 months: Single Family Home                                      Prior Living Arrangements/Services Living arrangements for the past 2 months: Single Family Home Lives with:: Self Patient language and need for interpreter reviewed:: Yes Do you feel safe going back to the place where you live?: Yes      Need for Family Participation in Patient Care: Yes (Comment) Care giver support system in place?: Yes (comment)   Criminal Activity/Legal Involvement Pertinent to Current Situation/Hospitalization: No - Comment as  needed  Activities of Daily Living   ADL Screening (condition at time of admission) Independently performs ADLs?: Yes (appropriate for developmental age) Is the patient deaf or have difficulty hearing?: No Does the patient have difficulty seeing, even when wearing glasses/contacts?: No Does the patient have difficulty concentrating, remembering, or making decisions?: No  Permission Sought/Granted Permission sought to share information with : Facility Industrial/product designer granted to share information with : Yes, Verbal Permission Granted     Permission granted to share info w AGENCY: Home Health and DME agencies        Emotional Assessment       Orientation: : Oriented to Self, Oriented to Place, Oriented to  Time, Oriented to Situation Alcohol / Substance Use: Not Applicable Psych Involvement: No (comment)  Admission diagnosis:  Atherosclerosis of artery of extremity with rest pain [facility]) [I70.229] Patient Active Problem List   Diagnosis Date Noted   Atherosclerosis of artery of extremity with rest pain (HCC) 08/30/2023   Atherosclerosis of native arteries of extremity with rest pain (HCC) 06/28/2023   Encounter for smoking cessation counseling 06/12/2023   Annual physical exam 06/12/2023   Chronic dental infection 05/14/2023   Type 2 diabetes mellitus without complication, without long-term current use of insulin (HCC) 03/27/2023   Low serum vitamin B12 03/27/2023   Alcohol use 01/05/2023   Claudication (HCC) 06/17/2021  SVT (supraventricular tachycardia) (HCC) 04/29/2021   Tobacco abuse 04/29/2021   Hyperlipidemia 12/17/2020   Essential hypertension 11/25/2020   Asthma, moderate persistent 11/25/2020   Anxiety and depression 11/25/2020   History of insomnia 11/25/2020   History of gastroesophageal reflux (GERD) 11/25/2020   Nicotine dependence with current use 11/25/2020   PCP:  Sherlyn Hay, DO Pharmacy:   [facility] [address] [place] [address] Phone: [phone] Fax: [phone]  [facility] [phone] - [place], [place] - [address] AT [facility] OF [address] [place] [address] Phone: [phone] Fax: [phone]     Social Drivers of Health (SDOH) Social History: SDOH Screenings   Food Insecurity: Food Insecurity Present (08/30/2023)  Housing: Unknown (08/30/2023)  Transportation Needs: No Transportation Needs (08/30/2023)  Utilities: Not At Risk (08/30/2023)  Alcohol Screen: Low Risk  (06/05/2023)  Recent Concern: Alcohol Screen - Medium Risk (03/27/2023)  Depression (PHQ2-9): Medium Risk (05/10/2023)  Financial Resource Strain: High Risk (03/27/2023)  Physical Activity: Unknown (03/27/2023)  Social Connections: Moderately Integrated (03/27/2023)  Stress: Stress Concern Present (03/27/2023)  Tobacco Use: High Risk (08/30/2023)   SDOH Interventions:     Readmission Risk Interventions     No data to display

## 2023-09-02 NOTE — Progress Notes (Signed)
3 Days Post-Op   Subjective/Chief Complaint: Doing OK. States some soreness in Bilateral groins, mild numbness along RIGHT medial thigh and RIGHT knee pain. States he has been up ambulating with a walker. Pain controlled otherwise. No other complaints.   Objective: Vital signs in last 24 hours: Temp:  [97.8 F (36.6 C)-98.4 F (36.9 C)] 97.8 F (36.6 C) (01/18 0529) Pulse Rate:  [76-88] 82 (01/18 0826) Resp:  [9-20] 20 (01/18 0826) BP: (95-132)/(64-76) 117/71 (01/18 0826) SpO2:  [90 %-98 %] 90 % (01/18 0826) Last BM Date : 08/30/23  Intake/Output from previous day: 01/17 0701 - 01/18 0700 In: -  Out: 3750 [Urine:3750] Intake/Output this shift: No intake/output data recorded.  General appearance: alert and no distress Resp: clear to auscultation bilaterally Cardio: regular rate and rhythm Extremities: Bilaterally -warm, Prevenas in place with good seal, soft, no erythema, no drainage, appropriately tender, feet warm- Right +PT palpable/Left +DP palpable  Lab Results:  Recent Labs    08/30/23 2000 08/31/23 0029  WBC 14.4* 18.5*  HGB 13.3 13.9  HCT 39.4 40.8  PLT 256 255   BMET Recent Labs    08/30/23 2000 08/31/23 0029  NA  --  138  K  --  4.1  CL  --  107  CO2  --  21*  GLUCOSE  --  137*  BUN  --  13  CREATININE 0.77 0.76  CALCIUM  --  8.6*   PT/INR No results for input(s): "LABPROT", "INR" in the last 72 hours. ABG No results for input(s): "PHART", "HCO3" in the last 72 hours.  Invalid input(s): "PCO2", "PO2"  Studies/Results: DG Knee 1-2 Views Right Result Date: 08/31/2023 CLINICAL DATA:  Right knee knee pain. Clinical question of Baker's cyst. EXAM: RIGHT KNEE - 1-2 VIEW COMPARISON:  None available FINDINGS: Tiny joint effusion. Minimal medial compartment joint space narrowing. No acute fracture or dislocation. There is some possible fluid density posterior to the femoral condyles on lateral view, which could represent a cyst including a Baker's cyst.  Mild-to-moderate atherosclerotic calcifications. IMPRESSION: 1. Tiny joint effusion. 2. Possible fluid density posterior to the femoral condyles on lateral view, which could represent a cyst including a Baker's cyst. Electronically Signed   By: Neita Garnet M.D.   On: 08/31/2023 17:52    Anti-infectives: Anti-infectives (From admission, onward)    Start     Dose/Rate Route Frequency Ordered Stop   08/30/23 1930  ceFAZolin (ANCEF) IVPB 2g/100 mL premix        2 g 200 mL/hr over 30 Minutes Intravenous Every 8 hours 08/30/23 1922 08/31/23 0441   08/30/23 1751  vancomycin (VANCOCIN) powder  Status:  Discontinued          As needed 08/30/23 1751 08/30/23 1822   08/30/23 1751  gentamicin (GARAMYCIN) injection  Status:  Discontinued          As needed 08/30/23 1752 08/30/23 1822   08/30/23 0600  ceFAZolin (ANCEF) IVPB 2g/100 mL premix        2 g 200 mL/hr over 30 Minutes Intravenous On call to O.R. 08/29/23 2231 08/30/23 1344       Assessment/Plan: s/p Procedure(s): ENDARTERECTOMY FEMORAL (Bilateral) INSERTION OF ILIAC STENT APPLICATION OF CELL SAVER (N/A) POD #3 Continue OOB-PT/ encourage IS Ortho Consult for RIGHT knee pain/cyst ASA/Plavix/Lovenox Dispo early next week  LOS: 3 days    Eli Hose A 09/02/2023

## 2023-09-02 NOTE — Discharge Instructions (Signed)
Vascular Surgery Discharge Instruction  Do not lift anything heavy.  Do not lift anything more than a gallon milk.  You may remove the Prevena wound vacs that are attached to both of your groins on Thursday, 09/07/2023.  As we discussed if the Prevena wound vacs failed before that you may enter into the shower to help with removal.  Once removed you may through the whole unit directly into the garbage.  Underneath the Prevena wound VAC to have staples please keep them dry and intact.  You may shower on Thursday after removal of the Prevena wound vacs.  Do not stand directly in the shower with the water hitting your groins.  When out of the shower make sure that you had or dry your groin areas with staples completely.  No driving until staples are removed.  You will be sent home with pain medication.  Please try to avoid using this pain medication during the day.  We recommend that you take it during the night to help sleep if needed.  Otherwise you may take Tylenol 650 mg every 4 hours.  You are being discharged on aspirin 81 mg daily, Plavix 75 mg daily, and Crestor 40 mg.  Please take these medications every day and do not skip a dose as it may affect the outcome of your surgery.  Do not take any NSAIDs such as Motrin, Advil, Aleve on top of these medications as you increase the risk of bleeding.  Follow-up with vein and vascular surgery as previously scheduled.             Transportation Resources  Agency Name: [facility] Agency Address: [address] [place], [place] [address] Phone: [phone] Email: [email or web address] Website: [email or web address] Service(s) Offered: Housing services, self-sufficiency, congregate meal program, weatherization program, Field seismologist program, emergency food assistance,  housing counseling, home ownership program, wheels-towork program.  Agency Name: [facility] [employer]  [phone]) Address: [address] [place], [place] [address] Phone: [phone] Website: [email or web address] Service(s) Offered: Transportation for [employer], subscription and demand response; Dial-a-Ride for citizens 46 years of age or older.  Agency Name: Department of Social Services Address: [address] [place], [place] [address] Phone: [phone] Service(s) Offered: Child support services; child welfare services; food stamps; Medicaid; work first family assistance; and aid with fuel,  rent, food and medicine, transportation assistance.  Agency Name: Disabled [employer] (DAV) Transportation  Network Phone: [phone] Service(s) Offered: Transports veterans to the [facility] medical center. Call  forty-eight hours in advance and leave the name, telephone  number, date, and time of appointment. Veteran will be  contacted by the driver the day before the appointment to  arrange a pick up point   Transportation Resources  Agency Name: [facility] Agency Address: [address] [place], [place] [address] Phone: [phone] Email: [email or web address] Website: [email or web address] Service(s) Offered: Housing services, self-sufficiency, congregate meal program, weatherization program, Field seismologist program, emergency food assistance,  housing counseling, home ownership program, wheels-towork program.  Agency Name: [facility] [employer] [phone]) Address: [address] [place], [place] [address] Phone: [phone] Website: www.acta-.com Service(s) Offered: Transportation for [employer], subscription and demand response; Dial-a-Ride for citizens 87 years of age or older.  Agency Name: Department of Social Services Address: [address] [place], [place] [address] Phone: [phone] Service(s) Offered: Child support services; child welfare services; food stamps; Medicaid;  work first family assistance; and aid with fuel,  rent, food and medicine, transportation assistance.  Agency  Name: Disabled [address] (DAV) Transportation  Network Phone: [phone] Service(s) Offered: Transports veterans to the [facility] medical center. Call  forty-eight hours in advance and leave the name, telephone  number, date, and time of appointment. Veteran will be  contacted by the driver the day before the appointment to  arrange a pick up point    [employer]A currently provides door to door services. ACTA connects with PART daily for services to [facility]. ACTA also performs contract services to [employer] operates 27 vehicles, all but 3 mini-vans are equipped with lifts for special needs as well as the general public. ACTA drivers are each CDL certified and trained in First Aid and CPR. ACTA was established in 2002 by [employer]. An independent Industrial/product designer. ACTA operates via Cytogeneticist with required Research scientist (physical sciences) from [place]. ACT[employer] provides over 80,000 passenger trips each year, including [facility] and [employer] sites.  Call at least by 11 AM one business day prior to needing transportation  [employer].                      [place], [place] [address]     Office Hours: Monday-Friday  8 AM - 5 PM

## 2023-09-03 LAB — GLUCOSE, CAPILLARY
Glucose-Capillary: 120 mg/dL — ABNORMAL HIGH (ref 70–99)
Glucose-Capillary: 140 mg/dL — ABNORMAL HIGH (ref 70–99)
Glucose-Capillary: 131 mg/dL — ABNORMAL HIGH (ref 70–99)
Glucose-Capillary: 168 mg/dL — ABNORMAL HIGH (ref 70–99)

## 2023-09-03 MED ORDER — DICLOFENAC SODIUM 1 % EX GEL
2.0000 g | Freq: Four times a day (QID) | CUTANEOUS | Status: DC
  Administered 2023-09-03 – 2023-09-04 (×4): 2 g via TOPICAL
  Filled 2023-09-03: qty 100

## 2023-09-03 MED ORDER — DICLOFENAC SODIUM 25 MG PO TBEC
50.0000 mg | DELAYED_RELEASE_TABLET | Freq: Two times a day (BID) | ORAL | Status: DC
  Administered 2023-09-03 – 2023-09-04 (×3): 50 mg via ORAL
  Filled 2023-09-03 (×4): qty 2

## 2023-09-03 NOTE — Consult Note (Signed)
ORTHOPAEDIC CONSULTATION  REQUESTING PHYSICIAN: Schnier, Latina Craver, MD  Chief Complaint: Right knee pain  HPI: Jason Livingston is a 58 y.o. male who complains of right knee pain.  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  He had stents placed in both iliac external iliac and anode endarterectomies done.  Subsequent to this he did develop some soreness over the anteromedial aspect of the left knee.  He is not any injury.  He improved he reports improved warmth in the legs.  He has not any injury.  An x-ray was done which reported a possible Baker's cyst in the knee.  Orthopedic consultation was requested.  Past Medical History:  Diagnosis Date   Arthritis    Asthma    Dyspnea    Elevated lipids 12/17/2020   Encounter to establish care 11/25/2020   GERD (gastroesophageal reflux disease)    Hypertension    Neuromuscular disorder (HCC)    Pre-diabetes    Sleep apnea    SVT (supraventricular tachycardia) (HCC)    Type 2 diabetes mellitus without complication, without long-term current use of insulin (HCC) 03/27/2023   Past Surgical History:  Procedure Laterality Date   COLONOSCOPY N/A 03/09/2021   Procedure: COLONOSCOPY;  Surgeon: Wyline Mood, MD;  Location: [facility] ENDOSCOPY;  Service: Gastroenterology;  Laterality: N/A;   LOWER EXTREMITY ANGIOGRAPHY Left 07/25/2023   Procedure: Lower Extremity Angiography;  Surgeon: Renford Dills, MD;  Location: [facility] INVASIVE CV LAB;  Service: Cardiovascular;  Laterality: Left;   MANDIBLE FRACTURE SURGERY     Social History   Socioeconomic History   Marital status: Single    Spouse name: Not on file   Number of children: Not on file   Years of education: Not on file   Highest education level: Associate degree: occupational, Scientist, product/process development, or vocational program  Occupational History   Not on file  Tobacco Use   Smoking status: Every Day    Current packs/day: 1.00    Average packs/day: 1 pack/day for 37.0 years  (37.0 ttl pk-yrs)    Types: Cigarettes   Smokeless tobacco: Former    Quit date: 2012  Vaping Use   Vaping status: Never Used  Substance and Sexual Activity   Alcohol use: Yes    Alcohol/week: 14.0 Livingston drinks of alcohol    Types: 14 Cans of beer per week    Comment: last use end of March 2024 "1 beer per day"; hx of 2 40oz beers at night to sleep, 02/18/21 20oz with dinner everynight   Drug use: Not Currently    Types: Cocaine, Marijuana, LSD    Comment: used in his 20's, has not used in 30 years   Sexual activity: Not Currently  Other Topics Concern   Not on file  Social History Narrative   Not on file   Social Drivers of Health   Financial Resource Strain: High Risk (03/27/2023)   Overall Financial Resource Strain (CARDIA)    Difficulty of Paying Living Expenses: Very hard  Food Insecurity: Food Insecurity Present (08/30/2023)   Hunger Vital Sign    Worried About Running Out of Food in the Last Year: Sometimes true    Ran Out of Food in the Last Year: Sometimes true  Transportation Needs: No Transportation Needs (08/30/2023)   PRAPARE - Administrator, Civil Service (Medical): No    Lack of Transportation (Non-Medical): No  Physical Activity: Unknown (03/27/2023)   Exercise Vital Sign    Days of Exercise per  Week: 0 days    Minutes of Exercise per Session: Not on file  Stress: Stress Concern Present (03/27/2023)   [employer] of Occupational Health - Occupational Stress Questionnaire    Feeling of Stress : To some extent  Social Connections: Moderately Integrated (03/27/2023)   Social Connection and Isolation Panel [NHANES]    Frequency of Communication with Friends and Family: Twice a week    Frequency of Social Gatherings with Friends and Family: Twice a week    Attends Religious Services: More than 4 times per year    Active Member of [employer] or Organizations: Yes    Attends Engineer, structural: More than 4 times per year    Marital Status:  Never married   Family History  Problem Relation Age of Onset   Other Mother        unknown medical history   Other Father        unknwon medical history   Hypertension Maternal Grandmother    Heart attack Maternal Grandfather 76   Heart disease Maternal Grandfather    Other Cousin        kidney transplant   No Known Allergies Prior to Admission medications   Medication Sig Start Date End Date Taking? Authorizing Provider  acetaminophen (TYLENOL) 500 MG tablet Take 500-1,000 mg by mouth every 6 (six) hours as needed (pain.).   Yes [provider]  albuterol (VENTOLIN HFA) 108 (90 Base) MCG/ACT inhaler Inhale 2 puffs into the lungs every 6 (six) hours as needed for wheezing or shortness of breath. 01/05/23  Yes Drubel, Lillia Abed, PA-C  aspirin EC 81 MG tablet Take 81 mg by mouth daily. Swallow whole.   Yes [provider]  chlorhexidine (PERIDEX) 0.12 % solution Use as directed 15 mLs in the mouth or throat 2 (two) times daily. Start on last day of antibiotic and take through appointment. Patient taking differently: Use as directed 15 mLs in the mouth or throat daily. Start on last day of antibiotic and take through appointment. 05/10/23  Yes Pardue, Monico Blitz, DO  cyanocobalamin (VITAMIN B12) 1000 MCG tablet Take 1 tablet (1,000 mcg total) by mouth daily. 03/27/23  Yes Pardue, Monico Blitz, DO  fluticasone furoate-vilanterol (BREO ELLIPTA) 200-25 MCG/ACT AEPB Inhale 1 puff into the lungs once daily. 01/05/23  Yes Drubel, Lillia Abed, PA-C  ibuprofen (ADVIL) 200 MG tablet Take 800 mg by mouth daily as needed (pain/headaches.).   Yes [provider]  losartan (COZAAR) 100 MG tablet Take 1 tablet (100 mg total) by mouth daily. 02/09/23  Yes Drubel, Lillia Abed, PA-C  metFORMIN (GLUCOPHAGE-XR) 500 MG 24 hr tablet Take 2 tablets (1,000 mg total) by mouth once daily with breakfast. 02/09/23  Yes Drubel, Lillia Abed, PA-C  metoprolol succinate (TOPROL XL) 100 MG 24 hr tablet Take 1 tablet (100 mg  total) by mouth once daily. 01/05/23  Yes [name], PA-C  metoprolol tartrate (LOPRESSOR) 25 MG tablet Take 1 tablet (25 mg total) by mouth once daily as needed. (For elevated heart rates or SVT). 02/09/23  Yes Drubel, Lillia Abed, PA-C  omeprazole (PRILOSEC) 20 MG capsule Take 1 capsule (20 mg total) by mouth once daily. 01/05/23  Yes [name], PA-C  rosuvastatin (CRESTOR) 40 MG tablet Take 1 tablet (40 mg total) by mouth daily. 05/31/23  Yes Pardue, Monico Blitz, DO  thiamine (VITAMIN B-1) 100 MG tablet Take 1 tablet (100 mg total) by mouth once daily. 01/05/23  Yes [name], PA-C  Accu-Chek Softclix Lancets lancets Use 1  each daily before breakfast. 05/10/23   Sherlyn Hay, DO  Blood Glucose Monitoring Suppl (BLOOD GLUCOSE MONITOR SYSTEM) w/Device KIT Use as directed 05/10/23   Sherlyn Hay, DO  Glucose Blood (BLOOD GLUCOSE TEST STRIPS) STRP Use daily before breakfast 05/10/23   Sherlyn Hay, DO  Lancet Device MISC 1 each by Does not apply route daily before breakfast. May substitute to any manufacturer covered by patient's insurance. 05/10/23 07/30/24  Sherlyn Hay, DO   No results found.  Positive ROS: All other systems have been reviewed and were otherwise negative with the exception of those mentioned in the HPI and as above.  Physical Exam: General: Alert, no acute distress Cardiovascular: No pedal edema Respiratory: No cyanosis, no use of accessory musculature GI: No organomegaly, abdomen is soft and non-tender Skin: No lesions in the area of chief complaint Neurologic: Sensation intact distally Psychiatric: Patient is competent for consent with normal mood and affect Lymphatic: No axillary or cervical lymphadenopathy  MUSCULOSKELETAL: Patient is alert sitting up in a chair.  Right knee and hip are examined.  He has good motion of the hip without pain.  He has no redness or swelling around the right knee.  He is slightly tender over the anterior medial quadriceps  insertion area.  He has full extension power both the quadriceps and patellar tendons.  He has full motion of the knee without pain.  There is no effusion or joint line pain.  His leg is warm below the knee.  X-rays of the right knee 2 view show minimal arthritis in the knee.  There is calcification posteriorly consistent with a fabella.  There are no evidence for Baker's cyst.   Assessment: Mild tendinitis right anterior knee  Plan: Recommend Ace bandage and use of Voltaren gel to this area.  Ice as needed.  He may ambulate as tolerated.  He may follow-up as an outpatient if problem persist.    Valinda Hoar, MD [phone]   09/03/2023 5:20 PM

## 2023-09-03 NOTE — Progress Notes (Signed)
4 Days Post-Op   Subjective/Chief Complaint: Complains of RIGHT knee pain. Noted LEFT great toe discomfort and mild bilateral groin pain. Ambulated with walker. States he feels constipated.   Objective: Vital signs in last 24 hours: Temp:  [97.9 F (36.6 C)-98.3 F (36.8 C)] 98.1 F (36.7 C) (01/19 0801) Pulse Rate:  [71-81] 81 (01/19 0801) Resp:  [16] 16 (01/19 0801) BP: (94-122)/(61-78) 111/75 (01/19 0801) SpO2:  [95 %-99 %] 98 % (01/19 0801) Last BM Date : 08/30/23  Intake/Output from previous day: 01/18 0701 - 01/19 0700 In: 240 [P.O.:240] Out: 1200 [Urine:1200] Intake/Output this shift: Total I/O In: -  Out: 100 [Urine:100]  General appearance: alert and no distress Cardio: regular rate and rhythm Extremities: warm, Prevena in place with good seal, soft, no erythema, +DP/PT bilaterally, LEFT great toe with mild erythema, warm  Lab Results:  No results for input(s): "WBC", "HGB", "HCT", "PLT" in the last 72 hours. BMET No results for input(s): "NA", "K", "CL", "CO2", "GLUCOSE", "BUN", "CREATININE", "CALCIUM" in the last 72 hours. PT/INR No results for input(s): "LABPROT", "INR" in the last 72 hours. ABG No results for input(s): "PHART", "HCO3" in the last 72 hours.  Invalid input(s): "PCO2", "PO2"  Studies/Results: No results found.  Anti-infectives: Anti-infectives (From admission, onward)    Start     Dose/Rate Route Frequency Ordered Stop   08/30/23 1930  ceFAZolin (ANCEF) IVPB 2g/100 mL premix        2 g 200 mL/hr over 30 Minutes Intravenous Every 8 hours 08/30/23 1922 08/31/23 0441   08/30/23 1751  vancomycin (VANCOCIN) powder  Status:  Discontinued          As needed 08/30/23 1751 08/30/23 1822   08/30/23 1751  gentamicin (GARAMYCIN) injection  Status:  Discontinued          As needed 08/30/23 1752 08/30/23 1822   08/30/23 0600  ceFAZolin (ANCEF) IVPB 2g/100 mL premix        2 g 200 mL/hr over 30 Minutes Intravenous On call to O.R. 08/29/23 2231  08/30/23 1344       Assessment/Plan: s/p Procedure(s): ENDARTERECTOMY FEMORAL (Bilateral) INSERTION OF ILIAC STENT APPLICATION OF CELL SAVER (N/A) POD #4  Continue OOB with PT Possible home tomorrow Ortho consult for RIGHT knee pain ASA/Plavix/Lovenox Bowel regimen  LOS: 4 days    Eli Hose A 09/03/2023

## 2023-09-03 NOTE — TOC Progression Note (Signed)
Transition of Care [facility]) - Progression Note    Patient Details  Name: Jason Livingston MRN: [id number] Date of Birth: March 31, 1966  [facility] CM/SW Contact  Liliana Cline, LCSW Phone Number: 09/03/2023, 9:42 AM  Clinical Narrative:    Anticipated discharge tomorrow 1/20 per MD. CSW called [employer] - ordered rollator and 3in1 for bedside delivery.   Expected Discharge Plan: Home w Home Health Services Barriers to Discharge: Continued Medical Work up  Expected Discharge Plan and Services       Living arrangements for the past 2 months: Single Family Home                                       Social Determinants of Health (SDOH) Interventions SDOH Screenings   Food Insecurity: Food Insecurity Present (08/30/2023)  Housing: Unknown (08/30/2023)  Transportation Needs: No Transportation Needs (08/30/2023)  Utilities: Not At Risk (08/30/2023)  Alcohol Screen: Low Risk  (06/05/2023)  Recent Concern: Alcohol Screen - Medium Risk (03/27/2023)  Depression (PHQ2-9): Medium Risk (05/10/2023)  Financial Resource Strain: High Risk (03/27/2023)  Physical Activity: Unknown (03/27/2023)  Social Connections: Moderately Integrated (03/27/2023)  Stress: Stress Concern Present (03/27/2023)  Tobacco Use: High Risk (08/30/2023)    Readmission Risk Interventions     No data to display

## 2023-09-03 NOTE — Progress Notes (Signed)
Physical Therapy Treatment Patient Details Name: Jason Livingston MRN: [id number] DOB: Nov 23, 1965 Today's Date: 09/03/2023   History of Present Illness Pt is a 58 y/o M admitted on 08/30/23 for scheduled bilateral femoral endarterectomy & insertion of iliac stent. PMH: arthritis, asthma, GERD, HTN, pre-diabetes, sleep apnea    PT Comments  Pt resting in recliner upon PT arrival; pt reporting recently walking but requesting to walk again (felt good for his back).  B groin/R knee pain 6/10 at rest beginning of session and 7/10 at rest end of session (nurse notified of pt's request for pain medication).  Pt reporting concerns of being able to stand from his bed at home so trialed sit to/from stand from bed x3 trials (with RW use) with initial vc's for technique/positioning (improved technique noted with practice).  During session pt CGA with transfers and ambulation in hallway with RW use.  Pt educated on recommendation for RW (including that RW appearing safer/more stable for pt to use) but pt reporting he wanted to have 4ww instead because it had a seat (therapist educated pt on safety concerns--pt verbalizing understanding but reporting he preferred to use 4ww).  Will continue to focus on strengthening and progressive functional mobility during hospitalization.   If plan is discharge home, recommend the following: A little help with walking and/or transfers;A little help with bathing/dressing/bathroom;Assistance with cooking/housework;Assist for transportation;Help with stairs or ramp for entrance   Can travel by private vehicle        Equipment Recommendations  Rolling walker (2 wheels);BSC/3in1    Recommendations for Other Services       Precautions / Restrictions Precautions Precautions: Fall Restrictions Weight Bearing Restrictions Per Provider Order: No     Mobility  Bed Mobility               General bed mobility comments: Deferred (pt resting in recliner beginning/end of  session)    Transfers Overall transfer level: Needs assistance Equipment used: Rolling walker (2 wheels) Transfers: Sit to/from Stand Sit to Stand: Contact guard assist           General transfer comment: x1 trial from recliner; x3 trials from bed; vc's for UE/LE placement and overall technique; pt tending to not lean forward as much during standing d/t B wound vac discomfort    Ambulation/Gait Ambulation/Gait assistance: Supervision Gait Distance (Feet): 200 Feet Assistive device: Rolling walker (2 wheels) Gait Pattern/deviations: Decreased step length - right, Decreased step length - left, Decreased stride length Gait velocity: decreased     General Gait Details: steady ambulating with RW use; increased B UE support noted through UE's onto walker; pt reporting R knee almost giving out 1x during ambulation   Stairs             Wheelchair Mobility     Tilt Bed    Modified Rankin (Stroke Patients Only)       Balance Overall balance assessment: Needs assistance Sitting-balance support: No upper extremity supported, Feet supported Sitting balance-Leahy Scale: Good Sitting balance - Comments: steady reaching within BOS   Standing balance support: During functional activity, Bilateral upper extremity supported, Reliant on assistive device for balance Standing balance-Leahy Scale: Good Standing balance comment: steady ambulating with RW use                            Cognition Arousal: Alert Behavior During Therapy: WFL for tasks assessed/performed Overall Cognitive Status: Within Functional Limits for tasks assessed  Exercises      General Comments  Pt agreeable to PT session.      Pertinent Vitals/Pain Pain Assessment Pain Assessment: 0-10 Pain Score: 7  Pain Location: B groin pain and R knee Pain Descriptors / Indicators: Sore, Discomfort Pain Intervention(s): Limited activity  within patient's tolerance, Monitored during session, Repositioned, Patient requesting pain meds-RN notified Vitals (HR and SpO2 on room air) stable and WFL throughout treatment session.    Home Living                          Prior Function            PT Goals (current goals can now be found in the care plan section) Acute Rehab PT Goals Patient Stated Goal: decreased pain PT Goal Formulation: With patient Time For Goal Achievement: 09/14/23 Potential to Achieve Goals: Good Progress towards PT goals: Progressing toward goals    Frequency    Min 1X/week      PT Plan      Co-evaluation              AM-PAC PT "6 Clicks" Mobility   Outcome Measure  Help needed turning from your back to your side while in a flat bed without using bedrails?: None Help needed moving from lying on your back to sitting on the side of a flat bed without using bedrails?: A Little Help needed moving to and from a bed to a chair (including a wheelchair)?: A Little Help needed standing up from a chair using your arms (e.g., wheelchair or bedside chair)?: A Little Help needed to walk in hospital room?: A Little Help needed climbing 3-5 steps with a railing? : A Little 6 Click Score: 19    End of Session Equipment Utilized During Treatment: Gait belt Activity Tolerance: Patient tolerated treatment well Patient left: in chair;with call bell/phone within reach Nurse Communication: Mobility status;Precautions;Patient requests pain meds PT Visit Diagnosis: Pain;Difficulty in walking, not elsewhere classified (R26.2);Other abnormalities of gait and mobility (R26.89);Muscle weakness (generalized) (M62.81) Pain - Right/Left: Right Pain - part of body: Knee     Time: 3664-4034 PT Time Calculation (min) (ACUTE ONLY): 20 min  Charges:    $Therapeutic Activity: 8-22 mins PT General Charges $$ ACUTE PT VISIT: 1 Visit                     Hendricks Limes, PT 09/03/23, 4:27 PM

## 2023-09-03 NOTE — Progress Notes (Signed)
Patient is not able to walk the distance required to go the bathroom, or he/she is unable to safely negotiate stairs required to access the bathroom.  A BSC will alleviate this problem.

## 2023-09-04 ENCOUNTER — Other Ambulatory Visit: Payer: Self-pay

## 2023-09-04 LAB — GLUCOSE, CAPILLARY
Glucose-Capillary: 91 mg/dL (ref 70–99)
Glucose-Capillary: 124 mg/dL — ABNORMAL HIGH (ref 70–99)

## 2023-09-04 MED ORDER — CLOPIDOGREL BISULFATE 75 MG PO TABS
75.0000 mg | ORAL_TABLET | Freq: Every day | ORAL | 6 refills | Status: DC
  Filled 2023-09-04: qty 30, 30d supply, fill #0
  Filled 2023-09-20 – 2023-10-03 (×3): qty 30, 30d supply, fill #1

## 2023-09-04 MED ORDER — OXYCODONE HCL 5 MG PO TABS: 5.0000 mg | ORAL_TABLET | ORAL | 0 refills | Status: DC | PRN

## 2023-09-04 MED ORDER — OXYCODONE HCL 5 MG PO TABS
5.0000 mg | ORAL_TABLET | ORAL | 0 refills | Status: DC | PRN
  Filled 2023-09-04: qty 30, 3d supply, fill #0

## 2023-09-04 MED ORDER — CLOPIDOGREL BISULFATE 75 MG PO TABS: 75.0000 mg | ORAL_TABLET | Freq: Every day | ORAL | 6 refills | Status: DC

## 2023-09-04 NOTE — Progress Notes (Signed)
Order received from Rolla Plate NP to remove the prevena to the right groin because it has lost it's seal (it is leaking )and I can't get it to seal.

## 2023-09-04 NOTE — Discharge Summary (Signed)
[facility]    Discharge Summary    Patient ID:  Jason Livingston MRN: [id number] DOB/AGE: 1966/03/30 58 y.o.  Admit date: 08/30/2023 Discharge date: 09/04/2023 Date of Surgery: 08/30/2023 Surgeon: Surgeon(s): Schnier, Latina Craver, MD Annice Needy, MD  Admission Diagnosis: Atherosclerosis of artery of extremity with rest pain [facility]) [I70.229]  Discharge Diagnoses:  Atherosclerosis of artery of extremity with rest pain [facility]) [I70.229]  Secondary Diagnoses: Past Medical History:  Diagnosis Date   Arthritis    Asthma    Dyspnea    Elevated lipids 12/17/2020   Encounter to establish care 11/25/2020   GERD (gastroesophageal reflux disease)    Hypertension    Neuromuscular disorder (HCC)    Pre-diabetes    Sleep apnea    SVT (supraventricular tachycardia) (HCC)    Type 2 diabetes mellitus without complication, without long-term current use of insulin (HCC) 03/27/2023    Procedure(s): ENDARTERECTOMY FEMORAL INSERTION OF ILIAC STENT APPLICATION OF CELL SAVER  Discharged Condition: good  HPI:  Jason Livingston is a 58 yo male who is post operative from bilateral femoral endarterectomies with iliac artery stent placement.  Patient is recovering as expected.  Patient will go home with bilateral Prevena wound vacs in place.  Patient's bilateral lower extremities are without pain and are warm to touch.  Patient has palpable pulses bilaterally.  Patient to be discharged home later today.  Patient to be discharged on aspirin 81 mg daily, Plavix 75 mg daily and Crestor 40 mg daily.  Patient was instructed not to skip any of these medications as it may affect the outcome of his surgery.  Hospital Course:  Jason Livingston is a 58 y.o. male is S/P Bilateral Extubated: POD # 0 Physical Exam:  Alert notes x3, no acute distress Face: Symmetrical.  Tongue is midline. Neck: Trachea is midline.  No swelling or bruising. Cardiovascular: Regular rate and  rhythm Pulmonary: Clear to auscultation bilaterally Abdomen: Soft, nontender, nondistended Right groin access: Clean dry and intact.  No swelling or drainage noted. Prevena Wound Vac in place Left groin access: Clean dry and intact.  No swelling or drainage noted. Prevena Wound Vac in Place  Left lower extremity: Thigh soft.  Calf soft.  Extremities warm distally toes.  Hard to palpate pedal pulses however the foot is warm is her good capillary refill. Right lower extremity: Thigh soft.  Calf soft.  Extremities warm distally toes.  Hard to palpate pedal pulses however the foot is warm is her good capillary refill. Neurological: No deficits noted   Post-op wounds:  clean, dry, intact or healing well  Pt. Ambulating, voiding and taking PO diet without difficulty. Pt pain controlled with PO pain meds.  Labs:  As below  Complications: none  Consults:  Treatment Team:  Deeann Saint, MD  Significant Diagnostic Studies: CBC Lab Results  Component Value Date   WBC 18.5 (H) 08/31/2023   HGB 13.9 08/31/2023   HCT 40.8 08/31/2023   MCV 93.2 08/31/2023   PLT 255 08/31/2023    BMET    Component Value Date/Time   NA 138 08/31/2023 0029   NA 139 05/10/2023 1632   K 4.1 08/31/2023 0029   CL 107 08/31/2023 0029   CO2 21 (L) 08/31/2023 0029   GLUCOSE 137 (H) 08/31/2023 0029   BUN 13 08/31/2023 0029   BUN 10 05/10/2023 1632   CREATININE 0.76 08/31/2023 0029   CALCIUM 8.6 (L) 08/31/2023 0029   GFRNONAA >60 08/31/2023 0029   COAG No  results found for: "INR", "PROTIME"   Disposition:  Discharge to :Home  Allergies as of 09/04/2023   No Known Allergies      Medication List     STOP taking these medications    ibuprofen 200 MG tablet Commonly known as: ADVIL       TAKE these medications    Accu-Chek Guide test strip Generic drug: glucose blood Use daily before breakfast   Accu-Chek Guide w/Device Kit Use as directed   Accu-Chek Softclix Lancets  lancets Use 1 each daily before breakfast.   acetaminophen 500 MG tablet Commonly known as: TYLENOL Take 500-1,000 mg by mouth every 6 (six) hours as needed (pain.).   aspirin EC 81 MG tablet Take 81 mg by mouth daily. Swallow whole.   Breo Ellipta 200-25 MCG/ACT Aepb Generic drug: fluticasone furoate-vilanterol Inhale 1 puff into the lungs once daily.   chlorhexidine 0.12 % solution Commonly known as: PERIDEX Use as directed 15 mLs in the mouth or throat 2 (two) times daily. Start on last day of antibiotic and take through appointment. What changed: when to take this   clopidogrel 75 MG tablet Commonly known as: PLAVIX Take 1 tablet (75 mg total) by mouth daily at 6 (six) AM. Start taking on: September 05, 2023   cyanocobalamin 1000 MCG tablet Commonly known as: VITAMIN B12 Take 1 tablet (1,000 mcg total) by mouth daily.   Lancet Device Misc 1 each by Does not apply route daily before breakfast. May substitute to any manufacturer covered by patient's insurance.   losartan 100 MG tablet Commonly known as: COZAAR Take 1 tablet (100 mg total) by mouth daily.   metFORMIN 500 MG 24 hr tablet Commonly known as: GLUCOPHAGE-XR Take 2 tablets (1,000 mg total) by mouth once daily with breakfast.   metoprolol succinate 100 MG 24 hr tablet Commonly known as: Toprol XL Take 1 tablet (100 mg total) by mouth once daily.   metoprolol tartrate 25 MG tablet Commonly known as: LOPRESSOR Take 1 tablet (25 mg total) by mouth once daily as needed. (For elevated heart rates or SVT).   omeprazole 20 MG capsule Commonly known as: PRILOSEC Take 1 capsule (20 mg total) by mouth once daily.   oxyCODONE 5 MG immediate release tablet Commonly known as: Oxy IR/ROXICODONE Take 1-2 tablets (5-10 mg total) by mouth every 4 (four) hours as needed for moderate pain (pain score 4-6).   rosuvastatin 40 MG tablet Commonly known as: Crestor Take 1 tablet (40 mg total) by mouth daily.   thiamine 100  MG tablet Commonly known as: VITAMIN B1 Take 1 tablet (100 mg total) by mouth once daily.   Ventolin HFA 108 (90 Base) MCG/ACT inhaler Generic drug: albuterol Inhale 2 puffs into the lungs every 6 (six) hours as needed for wheezing or shortness of breath.               Durable Medical Equipment  (From admission, onward)           Start     Ordered   09/03/23 1053  For home use only DME 4 wheeled rolling walker with seat  Once       Question:  Patient needs a walker to treat with the following condition  Answer:  Atherosclerosis of artery of extremity with rest pain (HCC)   09/03/23 1052   09/02/23 1330  For home use only DME Walker rolling  Once       Question Answer Comment  Walker: With 5 Inch  Wheels   Patient needs a walker to treat with the following condition Atherosclerosis of artery of extremity with rest pain (HCC)      09/02/23 1329           Verbal and written Discharge instructions given to the patient. Wound care per Discharge AVS  Follow-up Information     Georgiana Spinner, NP Follow up in 3 week(s).   Specialty: Vascular Surgery Why: Staple removal With Bilateral ultrasound with ABI's Contact information: [address] [place] [address] [phone]                 Signed: Marcie Bal, NP  09/04/2023, 10:46 AM

## 2023-09-04 NOTE — Plan of Care (Signed)

## 2023-09-04 NOTE — TOC Transition Note (Signed)
Transition of Care [facility]) - Discharge Note   Patient Details  Name: Jason Livingston MRN: [id number] Date of Birth: [birth year]  Transition of Care [facility] CM/SW Contact:  Garret Reddish, RN Phone Number: 09/04/2023, 2:27 PM   Clinical Narrative:    Chart reviewed.  Noted that patient has orders for discharge today.    I have informed Kandee Keen with [facility] that patient will be a discharge for today.  [facility] will provide Home Health PT for patient at home.    I have asked [facility] to provide patient a bedside commode and RW for home use.    I have informed staff nurse of the above information.    Final next level of care: Home w Home Health Services Barriers to Discharge: No Barriers Identified   Patient Goals and CMS Choice Patient states their goals for this hospitalization and ongoing recovery are:: home with home health CMS Medicare.gov Compare Post Acute Care list provided to:: Patient Choice offered to / list presented to : Patient      Discharge Placement                    Patient and family notified of of transfer: 09/04/23  Discharge Plan and Services Additional resources added to the After Visit Summary for                  DME Arranged: 3-N-1, Walker rolling DME Agency: [employer] Date DME Agency Contacted: 09/04/23   Representative spoke with at DME Agency: [facility] Arranged: PT HH Agency: [facility] Date [facility] Agency Contacted: 09/04/23   Representative spoke with at [facility] Agency: Kandee Keen  Social Drivers of Health (SDOH) Interventions SDOH Screenings   Food Insecurity: Food Insecurity Present (08/30/2023)  Housing: Unknown (08/30/2023)  Transportation Needs: No Transportation Needs (08/30/2023)  Utilities: Not At Risk (08/30/2023)  Alcohol Screen: Low Risk  (06/05/2023)  Recent Concern: Alcohol Screen - Medium Risk (03/27/2023)  Depression (PHQ2-9): Medium Risk (05/10/2023)  Financial Resource Strain: High Risk (03/27/2023)  Physical Activity: Unknown  (03/27/2023)  Social Connections: Moderately Integrated (03/27/2023)  Stress: Stress Concern Present (03/27/2023)  Tobacco Use: High Risk (08/30/2023)     Readmission Risk Interventions     No data to display

## 2023-09-05 ENCOUNTER — Encounter: Payer: Self-pay | Admitting: Vascular Surgery

## 2023-09-06 ENCOUNTER — Telehealth: Payer: Medicaid Other | Admitting: Family Medicine

## 2023-09-06 NOTE — Progress Notes (Deleted)
MyChart Video Visit    Virtual Visit via Video Note   This format is felt to be most appropriate for this patient at this time. Physical exam was limited by quality of the video and audio technology used for the visit.   Patient location: *** Provider location: [facility]  I discussed the limitations of evaluation and management by telemedicine and the availability of in person appointments. The patient expressed understanding and agreed to proceed.  Patient: Jason Livingston   DOB: 1965-09-17   58 y.o. Male  MRN: [id number] Visit Date: 09/06/2023  Today's healthcare provider: Sherlyn Hay, DO   No chief complaint on file.  Subjective    HPI   Patient's blood pressure is within normal limits.  He is experiencing a small amount of intermittent dizziness.  However, given that his blood pressure is usually on the upper end of normal, we will have him monitor his blood pressures at home and schedule with cardiology for their input as well, as he is due for follow-up. Continue metoprolol XL 100 mg daily Continue losartan 100 mg daily.  Continue metformin XR 1000 mg daily with breakfast. Continue low carbohydrate diet. Referred to nutrition and dietetics for diabetic education.   Ramses Lengle is a 58 yo male who is post operative from bilateral femoral endarterectomies with iliac artery stent placement.  Patient is recovering as expected.  Patient will go home with bilateral Prevena wound vacs in place.  Patient's bilateral lower extremities are without pain and are warm to touch.  Patient has palpable pulses bilaterally.  Patient to be discharged home later today.   Patient to be discharged on aspirin 81 mg daily, Plavix 75 mg daily and Crestor 40 mg daily.  Patient was instructed not to skip any of these medications as it may affect the outcome of his surgery.  ***   Medications: Outpatient Medications Prior to Visit  Medication Sig   Accu-Chek Softclix  Lancets lancets Use 1 each daily before breakfast.   acetaminophen (TYLENOL) 500 MG tablet Take 500-1,000 mg by mouth every 6 (six) hours as needed (pain.).   albuterol (VENTOLIN HFA) 108 (90 Base) MCG/ACT inhaler Inhale 2 puffs into the lungs every 6 (six) hours as needed for wheezing or shortness of breath.   aspirin EC 81 MG tablet Take 81 mg by mouth daily. Swallow whole.   Blood Glucose Monitoring Suppl (BLOOD GLUCOSE MONITOR SYSTEM) w/Device KIT Use as directed   chlorhexidine (PERIDEX) 0.12 % solution Use as directed 15 mLs in the mouth or throat 2 (two) times daily. Start on last day of antibiotic and take through appointment. (Patient taking differently: Use as directed 15 mLs in the mouth or throat daily. Start on last day of antibiotic and take through appointment.)   clopidogrel (PLAVIX) 75 MG tablet Take 1 tablet (75 mg total) by mouth daily at 6 (six) AM.   cyanocobalamin (VITAMIN B12) 1000 MCG tablet Take 1 tablet (1,000 mcg total) by mouth daily.   fluticasone furoate-vilanterol (BREO ELLIPTA) 200-25 MCG/ACT AEPB Inhale 1 puff into the lungs once daily.   Glucose Blood (BLOOD GLUCOSE TEST STRIPS) STRP Use daily before breakfast   Lancet Device MISC 1 each by Does not apply route daily before breakfast. May substitute to any manufacturer covered by patient's insurance.   losartan (COZAAR) 100 MG tablet Take 1 tablet (100 mg total) by mouth daily.   metFORMIN (GLUCOPHAGE-XR) 500 MG 24 hr tablet Take 2 tablets (1,000 mg total) by  mouth once daily with breakfast.   metoprolol succinate (TOPROL XL) 100 MG 24 hr tablet Take 1 tablet (100 mg total) by mouth once daily.   metoprolol tartrate (LOPRESSOR) 25 MG tablet Take 1 tablet (25 mg total) by mouth once daily as needed. (For elevated heart rates or SVT). (Patient not taking: Reported on 09/04/2023)   omeprazole (PRILOSEC) 20 MG capsule Take 1 capsule (20 mg total) by mouth once daily.   oxyCODONE (OXY IR/ROXICODONE) 5 MG immediate release  tablet Take 1-2 tablets (5-10 mg total) by mouth every 4 (four) hours as needed for moderate pain (pain score 4-6).   rosuvastatin (CRESTOR) 40 MG tablet Take 1 tablet (40 mg total) by mouth daily.   thiamine (VITAMIN B-1) 100 MG tablet Take 1 tablet (100 mg total) by mouth once daily.   No facility-administered medications prior to visit.    Review of Systems  {Insert previous labs (optional):23779} {See past labs  Heme  Chem  Endocrine  Serology  Results Review (optional):1}   Objective    There were no vitals taken for this visit.  {Insert last BP/Wt (optional):23777}{See vitals history (optional):1}    Physical Exam     Assessment & Plan    There are no diagnoses linked to this encounter.   ***  No follow-ups on file.     I discussed the assessment and treatment plan with the patient. The patient was provided an opportunity to ask questions and all were answered. The patient agreed with the plan and demonstrated an understanding of the instructions.   The patient was advised to call back or seek an in-person evaluation if the symptoms worsen or if the condition fails to improve as anticipated.  I provided *** minutes of virtual-face-to-face time during this encounter.   Sherlyn Hay, DO [facility] [phone] (phone) [phone] (fax)  [facility]

## 2023-09-11 ENCOUNTER — Telehealth (INDEPENDENT_AMBULATORY_CARE_PROVIDER_SITE_OTHER): Payer: Self-pay

## 2023-09-11 NOTE — Telephone Encounter (Signed)
Requesting  verbal orders for -  PT 1 week for 3 weeks starting this week Also, she needs an "Okay" for him to take omeprazole and Plavix together, she said they flagged that they interact with each other and in order for her to proceed she needs someone to say it's okay for him to take both.   Please advise

## 2023-09-12 ENCOUNTER — Encounter: Payer: Self-pay | Admitting: Critical Care Medicine

## 2023-09-12 ENCOUNTER — Other Ambulatory Visit: Payer: Self-pay

## 2023-09-12 NOTE — Congregational Nurse Program (Signed)
  Dept: [phone]   Congregational Nurse Program Note  Date of Encounter: 09/12/2023 Client to [facility] day center nurse led clinic following a hospitalization for vascular surgery. He reports needing all of his prescriptions refilled, which was done during this visit. Client uses the [facility] community pharmacy in the [place] building. Client had a virtual appointment with his PCP last week, but fell asleep and missed the appointment. He does plan to have an in person visit after he sees the vascular surgeon for his follow up on 2/5. Client is ambulating with a front wheeled walker and does have an order for a rollator walker which he will be getting today. He is receiving [place] home health, PT. Discharge paperwork reviewed with client. While hospitalized client reports that he was given Voltaren gel for pain to his right thigh. He has been using it, but continues to have some tingling pain to that thigh. He also had concerns about using the Voltaren as he was instructed not to take any NSAIDS. He is currently taking Plavix and Asa 81 mg. RN contacted the [facility] community pharmacy to verify that this was not contraindicated. Staples in place to bilateral incisions from revascularization surgery, no s/s of infection. Client is very interested in quitting smoking, he understands the effects it has on his healing and overall health. He is hoping to get medications at his PCP apt. RN did suggest 1-800-Quitnow as an option as well. Overall client appears to be doing well and is invested in his health and care. Francesco Runner BSN, RN Past Medical History: Past Medical History:  Diagnosis Date   Arthritis    Asthma    Dyspnea    Elevated lipids 12/17/2020   Encounter to establish care 11/25/2020   GERD (gastroesophageal reflux disease)    Hypertension    Neuromuscular disorder (HCC)    Pre-diabetes    Sleep apnea    SVT (supraventricular tachycardia) (HCC)    Type 2 diabetes mellitus  without complication, without long-term current use of insulin (HCC) 03/27/2023    Encounter Details:  Community Questionnaire - 09/12/23 1000       Questionnaire   Ask client: Do you give verbal consent for me to treat you today? Yes    Student Assistance N/A    Location Patient Served  [facility]    Encounter Setting CN site    Population Status Unknown   client does ave housing through a Scientist, research (life sciences)   Insurance Medicaid   [place] [facility]   Insurance/Financial Assistance Referral N/A    Medication N/A   client is able to obtain medications precribed  from [facility] community [place]   Medical Provider Yes   [facility], Jacquenette Shone MD   Screening Referrals Made N/A   Client has a chest CT scheduled at 2:30 today 9/24   Medical Referrals Made N/A   RN attempted to make a dental apt for client, called 3 no new patient apts avail. until Feb 2024   Medical Appointment Completed N/A    CNP Interventions Advocate/Support;Educate;Navigate Healthcare System;Reviewed D/C Planning   assisted client with some [employer] questions and concerns. Contacted medicaid direct to find dental providers in this area   Screenings CN Performed N/A    ED Visit Averted N/A    Life-Saving Intervention Made N/A

## 2023-09-12 NOTE — Telephone Encounter (Signed)
That is fine

## 2023-09-12 NOTE — Telephone Encounter (Signed)
Orders given.

## 2023-09-12 NOTE — Telephone Encounter (Signed)
Left therapist a VM to return the call so we can give her verbal orders. Unsure if her VM is secure or not.

## 2023-09-12 NOTE — Congregational Nurse Program (Signed)
  Dept: [phone]   Congregational Nurse Program Note  Date of Encounter: 09/12/2023  Past Medical History: Past Medical History:  Diagnosis Date   Arthritis    Asthma    Dyspnea    Elevated lipids 12/17/2020   Encounter to establish care 11/25/2020   GERD (gastroesophageal reflux disease)    Hypertension    Neuromuscular disorder (HCC)    Pre-diabetes    Sleep apnea    SVT (supraventricular tachycardia) (HCC)    Type 2 diabetes mellitus without complication, without long-term current use of insulin (HCC) 03/27/2023    Encounter Details:

## 2023-09-18 ENCOUNTER — Other Ambulatory Visit (INDEPENDENT_AMBULATORY_CARE_PROVIDER_SITE_OTHER): Payer: Self-pay | Admitting: Vascular Surgery

## 2023-09-18 DIAGNOSIS — Z9889 Other specified postprocedural states: Secondary | ICD-10-CM

## 2023-09-20 ENCOUNTER — Encounter (INDEPENDENT_AMBULATORY_CARE_PROVIDER_SITE_OTHER): Payer: Self-pay | Admitting: Nurse Practitioner

## 2023-09-20 ENCOUNTER — Ambulatory Visit (INDEPENDENT_AMBULATORY_CARE_PROVIDER_SITE_OTHER): Payer: Medicaid Other | Admitting: Nurse Practitioner

## 2023-09-20 ENCOUNTER — Other Ambulatory Visit: Payer: Self-pay

## 2023-09-20 ENCOUNTER — Ambulatory Visit (INDEPENDENT_AMBULATORY_CARE_PROVIDER_SITE_OTHER): Payer: Medicaid Other

## 2023-09-20 VITALS — BP 111/69 | HR 64 | Resp 16 | Wt 215.8 lb

## 2023-09-20 DIAGNOSIS — I739 Peripheral vascular disease, unspecified: Secondary | ICD-10-CM

## 2023-09-20 DIAGNOSIS — Z9889 Other specified postprocedural states: Secondary | ICD-10-CM

## 2023-09-20 DIAGNOSIS — I70223 Atherosclerosis of native arteries of extremities with rest pain, bilateral legs: Secondary | ICD-10-CM

## 2023-09-20 MED ORDER — GABAPENTIN 300 MG PO CAPS
300.0000 mg | ORAL_CAPSULE | Freq: Two times a day (BID) | ORAL | 0 refills | Status: DC
Start: 2023-09-20 — End: 2024-02-09
  Filled 2023-09-20: qty 60, 30d supply, fill #0

## 2023-09-21 ENCOUNTER — Encounter (INDEPENDENT_AMBULATORY_CARE_PROVIDER_SITE_OTHER): Payer: Self-pay | Admitting: Nurse Practitioner

## 2023-09-21 NOTE — Progress Notes (Signed)
 Subjective:    Patient ID: Jason Livingston, male    DOB: 1966/07/17, 58 y.o.   MRN: [id number] Chief Complaint  Patient presents with   Routine Post Op    [facility] 3 week with ABI    The patient is a 58 year old male that presents today after intervention on 08/30/2023 including:  PROCEDURE: 1. Bilateral common femoral and profunda femoris endarterectomy with bovine pericardial patch angioplasty. 2. Introduction catheter into the aorta bilateral common femoral artery approach. 3. Shockwave lithotripsy to the bilateral external iliac arteries. 4. Stent placement to the external iliac arteries bilaterally. 5. Application of a disposable Prevena incisional VAC  Today he notes he has some numbness of his thighs with some swelling associated but his pain is fairly well-controlled.  His incisions are clean dry and intact with no dehiscence.  No evidence of infection is present.  The patient does not have any significant swelling distally.  Today he has a right ABI 1.24 with a left of 0.87.  There is triphasic tibial artery waveforms on the right but monophasic in the left.  He notes that he does not have any claudication or rest pain like symptoms.    Review of Systems  Cardiovascular:  Negative for leg swelling.  Skin:  Positive for wound.  All other systems reviewed and are negative.      Objective:   Physical Exam Vitals reviewed.  HENT:     Head: Normocephalic.  Cardiovascular:     Rate and Rhythm: Normal rate.     Pulses:          Dorsalis pedis pulses are detected w/ Doppler on the right side and detected w/ Doppler on the left side.       Posterior tibial pulses are detected w/ Doppler on the right side and detected w/ Doppler on the left side.  Pulmonary:     Effort: Pulmonary effort is normal.  Skin:    General: Skin is warm and dry.  Neurological:     Mental Status: He is alert and oriented to person, place, and time.  Psychiatric:        Mood and Affect: Mood normal.         Behavior: Behavior normal.        Thought Content: Thought content normal.        Judgment: Judgment normal.     BP 111/69   Pulse 64   Resp 16   Wt 215 lb 12.8 oz (97.9 kg)   BMI 29.27 kg/m   Past Medical History:  Diagnosis Date   Arthritis    Asthma    Dyspnea    Elevated lipids 12/17/2020   Encounter to establish care 11/25/2020   GERD (gastroesophageal reflux disease)    Hypertension    Neuromuscular disorder (HCC)    Pre-diabetes    Sleep apnea    SVT (supraventricular tachycardia) (HCC)    Type 2 diabetes mellitus without complication, without long-term current use of insulin  (HCC) 03/27/2023    Social History   Socioeconomic History   Marital status: Single    Spouse name: Not on file   Number of children: Not on file   Years of education: Not on file   Highest education level: Associate degree: occupational, scientist, product/process development, or vocational program  Occupational History   Not on file  Tobacco Use   Smoking status: Every Day    Current packs/day: 1.00    Average packs/day: 1 pack/day for 37.0 years (37.0 ttl pk-yrs)  Types: Cigarettes   Smokeless tobacco: Former    Quit date: 2012  Vaping Use   Vaping status: Never Used  Substance and Sexual Activity   Alcohol use: Yes    Alcohol/week: 14.0 standard drinks of alcohol    Types: 14 Cans of beer per week    Comment: last use end of March 2024 1 beer per day; hx of 2 40oz beers at night to sleep, 02/18/21 20oz with dinner everynight   Drug use: Not Currently    Types: Cocaine, Marijuana, LSD    Comment: used in his 20's, has not used in 30 years   Sexual activity: Not Currently  Other Topics Concern   Not on file  Social History Narrative   Not on file   Social Drivers of Health   Financial Resource Strain: High Risk (03/27/2023)   Overall Financial Resource Strain (CARDIA)    Difficulty of Paying Living Expenses: Very hard  Food Insecurity: Food Insecurity Present (08/30/2023)   Hunger Vital Sign     Worried About Running Out of Food in the Last Year: Sometimes true    Ran Out of Food in the Last Year: Sometimes true  Transportation Needs: No Transportation Needs (08/30/2023)   PRAPARE - Administrator, Civil Service (Medical): No    Lack of Transportation (Non-Medical): No  Physical Activity: Unknown (03/27/2023)   Exercise Vital Sign    Days of Exercise per Week: 0 days    Minutes of Exercise per Session: Not on file  Stress: Stress Concern Present (03/27/2023)   [employer] of Occupational Health - Occupational Stress Questionnaire    Feeling of Stress : To some extent  Social Connections: Moderately Integrated (03/27/2023)   Social Connection and Isolation Panel [NHANES]    Frequency of Communication with Friends and Family: Twice a week    Frequency of Social Gatherings with Friends and Family: Twice a week    Attends Religious Services: More than 4 times per year    Active Member of Clubs or Organizations: Yes    Attends Banker Meetings: More than 4 times per year    Marital Status: Never married  Intimate Partner Violence: Not At Risk (08/30/2023)   Humiliation, Afraid, Rape, and Kick questionnaire    Fear of Current or Ex-Partner: No    Emotionally Abused: No    Physically Abused: No    Sexually Abused: No    Past Surgical History:  Procedure Laterality Date   COLONOSCOPY N/A 03/09/2021   Procedure: COLONOSCOPY;  Surgeon: Therisa Bi, MD;  Location: [facility] END[facility]COPY;  Service: Gastroenterology;  Laterality: N/A;   ENDARTERECTOMY FEMORAL Bilateral 08/30/2023   Procedure: ENDARTERECTOMY FEMORAL;  Surgeon: Jama Cordella MATSU, MD;  Location: [facility] ORS;  Service: Vascular;  Laterality: Bilateral;   INSERTION OF ILIAC STENT  08/30/2023   Procedure: INSERTION OF ILIAC STENT;  Surgeon: Jama Cordella MATSU, MD;  Location: [facility] ORS;  Service: Vascular;;   LOWER EXTREMITY ANGIOGRAPHY Left 07/25/2023   Procedure: Lower Extremity Angiography;  Surgeon:  Jama Cordella MATSU, MD;  Location: [facility] INVASIVE CV LAB;  Service: Cardiovascular;  Laterality: Left;   MANDIBLE FRACTURE SURGERY      Family History  Problem Relation Age of Onset   Other Mother        unknown medical history   Other Father        unknwon medical history   Hypertension Maternal Grandmother    Heart attack Maternal Grandfather 31   Heart disease Maternal  Grandfather    Other Cousin        kidney transplant    No Known Allergies     Latest Ref Rng & Units 08/31/2023   12:29 AM 08/30/2023    8:00 PM 08/29/2023    3:16 PM  CBC  WBC 4.0 - 10.5 K/uL 18.5  14.4  8.5   Hemoglobin 13.0 - 17.0 g/dL 86.0  86.6  86.1   Hematocrit 39.0 - 52.0 % 40.8  39.4  39.3   Platelets 150 - 400 K/uL 255  256  278       CMP     Component Value Date/Time   NA 138 08/31/2023 0029   NA 139 05/10/2023 1632   K 4.1 08/31/2023 0029   CL 107 08/31/2023 0029   CO2 21 (L) 08/31/2023 0029   GLUCOSE 137 (H) 08/31/2023 0029   BUN 13 08/31/2023 0029   BUN 10 05/10/2023 1632   CREATININE 0.76 08/31/2023 0029   CALCIUM  8.6 (L) 08/31/2023 0029   PROT 6.9 05/10/2023 1632   ALBUMIN 4.6 05/10/2023 1632   AST 17 05/10/2023 1632   ALT 15 05/10/2023 1632   ALKPHOS 64 05/10/2023 1632   BILITOT 0.3 05/10/2023 1632   EGFR 102 05/10/2023 1632   GFRNONAA >60 08/31/2023 0029     No results found.     Assessment & Plan:   1. 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  All of his staples were removed today.  Steri-Strips were applied.  Will have him return in 2 weeks for wound evaluation   Current Outpatient Medications on File Prior to Visit  Medication Sig Dispense Refill   Accu-Chek Softclix Lancets lancets Use 1 each daily before breakfast. 100 each 3   acetaminophen  (TYLENOL ) 500 MG tablet Take 500-1,000 mg by mouth every 6  (six) hours as needed (pain.).     albuterol  (VENTOLIN  HFA) 108 (90 Base) MCG/ACT inhaler Inhale 2 puffs into the lungs every 6 (six) hours as needed for wheezing or shortness of breath. 18 g 0   aspirin  EC 81 MG tablet Take 81 mg by mouth daily. Swallow whole.     Blood Glucose Monitoring Suppl (BLOOD GLUCOSE MONITOR SYSTEM) w/Device KIT Use as directed 1 kit 0   chlorhexidine  (PERIDEX ) 0.12 % solution Use as directed 15 mLs in the mouth or throat 2 (two) times daily. Start on last day of antibiotic and take through appointment. (Patient taking differently: Use as directed 15 mLs in the mouth or throat daily. Start on last day of antibiotic and take through appointment.) 473 mL 2   clopidogrel  (PLAVIX ) 75 MG tablet Take 1 tablet (75 mg total) by mouth daily at 6 (six) AM. 30 tablet 6   cyanocobalamin  (VITAMIN B12) 1000 MCG tablet Take 1 tablet (1,000 mcg total) by mouth daily. 90 tablet 1   fluticasone  furoate-vilanterol (BREO ELLIPTA ) 200-25 MCG/ACT AEPB Inhale 1 puff into the lungs once daily. 60 each 3   Glucose Blood (BLOOD GLUCOSE TEST STRIPS) STRP Use daily before breakfast 100 strip 3   Lancet Device MISC 1 each by Does not apply route daily before breakfast. May substitute to any manufacturer covered by patient's insurance. 1 each 0   losartan  (COZAAR ) 100 MG tablet Take 1 tablet (100 mg total) by mouth daily. 90 tablet 1   metFORMIN  (GLUCOPHAGE -XR) 500 MG  24 hr tablet Take 2 tablets (1,000 mg total) by mouth once daily with breakfast. 180 tablet 2   metoprolol  succinate (TOPROL  XL) 100 MG 24 hr tablet Take 1 tablet (100 mg total) by mouth once daily. 90 tablet 1   omeprazole  (PRILOSEC) 20 MG capsule Take 1 capsule (20 mg total) by mouth once daily. 90 capsule 1   oxyCODONE  (OXY IR/ROXICODONE ) 5 MG immediate release tablet Take 1-2 tablets (5-10 mg total) by mouth every 4 (four) hours as needed for moderate pain (pain score 4-6). 30 tablet 0   rosuvastatin  (CRESTOR ) 40 MG tablet Take 1 tablet  (40 mg total) by mouth daily. 90 tablet 3   thiamine  (VITAMIN B-1) 100 MG tablet Take 1 tablet (100 mg total) by mouth once daily. 90 tablet 3   metoprolol  tartrate (LOPRESSOR ) 25 MG tablet Take 1 tablet (25 mg total) by mouth once daily as needed. (For elevated heart rates or SVT). (Patient not taking: Reported on 09/04/2023) 30 tablet 1   No current facility-administered medications on file prior to visit.    There are no Patient Instructions on file for this visit. No follow-ups on file.   Payam Gribble E Genowefa Morga, NP

## 2023-09-22 LAB — VAS US ABI WITH/WO TBI
Left ABI: 0.87
Right ABI: 1.24

## 2023-10-03 ENCOUNTER — Encounter: Payer: Medicaid Other | Attending: Family Medicine | Admitting: Dietician

## 2023-10-03 ENCOUNTER — Encounter: Payer: Self-pay | Admitting: Dietician

## 2023-10-03 ENCOUNTER — Other Ambulatory Visit: Payer: Self-pay

## 2023-10-03 DIAGNOSIS — E119 Type 2 diabetes mellitus without complications: Secondary | ICD-10-CM | POA: Insufficient documentation

## 2023-10-03 NOTE — Patient Instructions (Addendum)
Check your blood sugar each morning before eating or drinking (fasting). Look for numbers between 70-100 mg/dL Check your blood sugar 2 hours after you begin eating a meal. Look for numbers under 180 mg/dL at all times.  Continue to utilize your local food resources for food assistance!  Choose Halo Top low calorie ice cream in place of regular ice cream!  Keep up the great work walking more and recovering after your surgery. Continue to do your PT exercises at home and walk more, as tolerated!!

## 2023-10-03 NOTE — Progress Notes (Signed)
Diabetes Self-Management Education  Visit Type: Follow-up  Appt. Start Time: 1400 Appt. End Time: 1435  10/03/2023  [name], identified by name and date of birth, is a 58 y.o. male with a diagnosis of Diabetes:  .   ASSESSMENT Pt reports having recent surgery to resolve atherosclerosis in both femoral arteries, states they were started on Plavix and Gabapentin (for nerve pain in legs). Pt states they can walk more and can tolerate standing for longer periods of time. Pt reports working with a physical therapist weekly for the month after surgery, continuing to do PT exercises at home. Pt reports checking fasting blood sugar daily, brought log to visit, RD reviewed log. FBG values usually around ~90-100, PPBG ~120 -160. Pt reports some recent food insecurity after surgery due to having to stray home for recovery, relying on canned/packaged foods that friends have brought them, states their EBT ran out while in the hospital. Pt reports limiting their candy intake, states that they are still eating ice cream frequently. Pt reports trying to  choose leaner proteins when available.   Diabetes Self-Management Education - 10/03/23 1607       Visit Information   Visit Type Follow-up      Pre-Education Assessment   Patient understands the diabetes disease and treatment process. Needs Review    Patient understands incorporating nutritional management into lifestyle. Needs Review    Patient undertands incorporating physical activity into lifestyle. Needs Review    Patient understands using medications safely. Comprehends key points    Patient understands monitoring blood glucose, interpreting and using results Comprehends key points    Patient understands prevention, detection, and treatment of acute complications. Needs Review    Patient understands prevention, detection, and treatment of chronic complications. Needs Review    Patient understands how to develop strategies to address  psychosocial issues. Needs Review    Patient understands how to develop strategies to promote health/change behavior. Needs Review      Complications   How often do you check your blood sugar? 1-2 times/day    Fasting Blood glucose range (mg/dL) 16-109    Postprandial Blood glucose range (mg/dL) 604-540      Dietary Intake   Lunch [place] and cheese sandwich    Dinner Grilled chicken, sweet potatoes, stuffing, canned corn    Beverage(s) Coffee, cranberry juice, water      Activity / Exercise   Activity / Exercise Type ADL's   PT/Rehab   How many days per week do you exercise? 3    How many minutes per day do you exercise? 15    Total minutes per week of exercise 45      Patient Self-Evaluation of Goals - Patient rates self as meeting previously set goals (% of time)   Nutrition 25 - 50% (sometimes)    Physical Activity 25 - 50% (sometimes)    Medications >75% (most of the time)    Monitoring >75% (most of the time)    Problem Solving and behavior change strategies  25 - 50% (sometimes)    Reducing Risk (treating acute and chronic complications) 25 - 50% (sometimes)    Health Coping 25 - 50% (sometimes)      Post-Education Assessment   Patient understands the diabetes disease and treatment process. Comprehends key points    Patient understands incorporating nutritional management into lifestyle. Comprehends key points    Patient undertands incorporating physical activity into lifestyle. Needs Review   S/P surgery and PT, More mobile  Patient understands using medications safely. Comphrehends key points    Patient understands monitoring blood glucose, interpreting and using results Comprehends key points    Patient understands prevention, detection, and treatment of acute complications. Comprehends key points    Patient understands prevention, detection, and treatment of chronic complications. Comprehends key points    Patient understands how to develop strategies to address  psychosocial issues. Comprehends key points    Patient understands how to develop strategies to promote health/change behavior. Comprehends key points      Outcomes   Expected Outcomes Demonstrated interest in learning but significant barriers to change    Future DMSE 4-6 wks    Program Status Not Completed      Subsequent Visit   Since your last visit have you continued or begun to take your medications as prescribed? Yes    Since your last visit have you had your blood pressure checked? Yes    Is your most recent blood pressure lower, unchanged, or higher since your last visit? Lower    Since your last visit have you experienced any weight changes? No change    Since your last visit, are you checking your blood glucose at least once a day? Yes             Individualized Plan for Diabetes Self-Management Training:   Learning Objective:  Patient will have a greater understanding of diabetes self-management. Patient education plan is to attend individual and/or group sessions per assessed needs and concerns.   Plan:   Patient Instructions  Check your blood sugar each morning before eating or drinking (fasting). Look for numbers between 70-100 mg/dL Check your blood sugar 2 hours after you begin eating a meal. Look for numbers under 180 mg/dL at all times.  Continue to utilize your local food resources for food assistance!  Choose Halo Top low calorie ice cream in place of regular ice cream!  Keep up the great work walking more and recovering after your surgery. Continue to do your PT exercises at home and walk more, as tolerated!!       Expected Outcomes:  Demonstrated interest in learning but significant barriers to change  Education material provided: [employer]  If problems or questions, patient to contact team via:  Phone and Email  Future DSME appointment: 4-6 wks

## 2023-10-04 ENCOUNTER — Other Ambulatory Visit: Payer: Self-pay

## 2023-10-04 ENCOUNTER — Ambulatory Visit: Payer: Medicaid Other | Admitting: Dietician

## 2023-10-05 ENCOUNTER — Encounter: Payer: Self-pay | Admitting: Medical

## 2023-10-05 ENCOUNTER — Ambulatory Visit: Payer: Medicaid Other | Attending: Medical | Admitting: Medical

## 2023-10-05 VITALS — BP 118/72 | HR 58 | Ht 73.0 in | Wt 218.2 lb

## 2023-10-05 DIAGNOSIS — E782 Mixed hyperlipidemia: Secondary | ICD-10-CM | POA: Diagnosis not present

## 2023-10-05 DIAGNOSIS — I471 Supraventricular tachycardia, unspecified: Secondary | ICD-10-CM

## 2023-10-05 DIAGNOSIS — I739 Peripheral vascular disease, unspecified: Secondary | ICD-10-CM | POA: Diagnosis not present

## 2023-10-05 DIAGNOSIS — Z720 Tobacco use: Secondary | ICD-10-CM

## 2023-10-05 DIAGNOSIS — Z789 Other specified health status: Secondary | ICD-10-CM

## 2023-10-05 DIAGNOSIS — I2510 Atherosclerotic heart disease of native coronary artery without angina pectoris: Secondary | ICD-10-CM | POA: Diagnosis not present

## 2023-10-05 NOTE — Progress Notes (Signed)
Cardiology Office Note:  .   Date:  10/05/2023  ID:  Jason Livingston, DOB 04-06-1966, MRN [id number] PCP: Sherlyn Hay, DO  [facility] Providers Cardiologist:  Yvonne Kendall, MD     History of Present Illness: .   Jason Livingston is a 58 y.o. male  with a hx of pSVT, HTN, HLD, prediabetes, tobacco use, alcohol abuse, PAD s/p bilateral endarterectomies with stenting to the external iliac arteries bilaterally 08/2023 who presents for follow-up.    He was seen in the ED September 2022 for palpitations due to SVT, treated with adenosine with minimal troponin elevation. He was started on metoprolol 25mg  daily. Echo was ordered which showed LVEF G1DD, mild MR, and mild MAE.    He was seen 06/16/21 and noted some improvement in symptoms of his palpitations. Metoprolol was increased to 50mg  daily. He also reported b/l claudication and ABIs, US aorta/IVC/iliac, lower extremity US were ordered, but not performed. Myoview stress test was ordered for chest pain and SOB, but this was not completed due to inability to not smoke for 24 hours.   Today, the patient reports he was started on Plavix and gabapentin by Vascular surgery. He does not report claudication symptoms. He says he has nerve pain that is worse when he does exercise. He denies chest pain, SOB, lower leg edema, orthopnea or pnd. He reports rare lightheadedness or dizziness. He is in the process of quitting smoking. He denies heart racing or palpitations. Denies alcohol use.    Studies Reviewed: [name] Kitchen   EKG Interpretation Date/Time:  Thursday October 05 2023 15:15:07 EST Ventricular Rate:  58 PR Interval:  156 QRS Duration:  110 QT Interval:  416 QTC Calculation: 408 R Axis:   40  Text Interpretation: Sinus bradycardia Incomplete right bundle branch block When compared with ECG of 06-Jul-2023 14:00, No significant change was found Confirmed by Fransico Michael, Yamel Bale ([id number]) on 10/05/2023 3:18:33 PM   Echo 07/2023  1. Left ventricular  ejection fraction, by estimation, is 60 to 65%. The  left ventricle has normal function. The left ventricle has no regional  wall motion abnormalities. Left ventricular diastolic parameters were  normal. The average left ventricular  global longitudinal strain is -15.9 %.   2. Right ventricular systolic function is normal. The right ventricular  size is normal. Tricuspid regurgitation signal is inadequate for assessing  PA pressure.   3. The mitral valve is normal in structure. Mild mitral valve  regurgitation. No evidence of mitral stenosis.   4. The aortic valve has an indeterminant number of cusps. Aortic valve  regurgitation is not visualized. No aortic stenosis is present.   5. The inferior vena cava is normal in size with greater than 50%  respiratory variability, suggesting right atrial pressure of 3 mmHg.   Cardiac CT 01/2022 IMPRESSION: 1. Coronary calcium score of 159. This was 87th percentile for age and sex matched control.   2. Normal coronary origin with right dominance.   3. Mild proximal LAD stenosis (25%).   4. Minimal proximal RCA stenosis (<25%).   5. CAD-RADS 2. Mild non-obstructive CAD (25-49%). Consider non-atherosclerotic causes of chest pain. Consider preventive therapy and risk factor modification.   Electronically Signed: By: Debbe Odea M.D. On: 01/13/2022 15:48     Physical Exam:   VS:  BP 118/72 (BP Location: Left Arm, Patient Position: Sitting, Cuff Size: Large)   Pulse (!) 58   Ht 6\' 1"  (1.854 m)   Wt 218 lb 3.2 oz (99 kg)  SpO2 96%   BMI 28.79 kg/m    Wt Readings from Last 3 Encounters:  10/05/23 218 lb 3.2 oz (99 kg)  09/20/23 215 lb 12.8 oz (97.9 kg)  08/30/23 218 lb 12.8 oz (99.2 kg)    GEN: Well nourished, well developed in no acute distress NECK: No JVD; No carotid bruits CARDIAC: RRR, no murmurs, rubs, gallops RESPIRATORY:  Clear to auscultation without rales, wheezing or rhonchi  ABDOMEN: Soft, non-tender,  non-distended EXTREMITIES:  No edema; No deformity   ASSESSMENT AND PLAN: .    pSVT The patient denies palpitations. Continue Toprol 100mg  daily. He uses Lopressor as needed for palpitations.   Nonobstructive CAD Echo showed normal LVEF 60-65%, no WMA, normal RVSF, mild MR. Prior cardiac CTA 2023 showed mild nonobstructive CAD. He denies anginal symptoms. Continue ASA 81mg  daily, Plavix 75mg  daily, and Crestor 40 mg daily.   PAD s/p Bl femoral endarterectomies The patient is followed by [facility]. No claudication symptoms reported. Says he has nerve pain in his legs. Continue ASA, Plavix and Crestor.   HLD LDL 123. We will update a lipid panel. Continue Crestor 40mg  daily. He may need addition of Zetia if still elevated.   Tobacco use He is in the process of quitting smoking.   Alcohol use He reports continued cessation.     Dispo: Follow-up in 6 months  Signed, Liberato Stansbery David Stall, PA-C

## 2023-10-05 NOTE — Patient Instructions (Addendum)
Medication Instructions:  No changes at this time.   *If you need a refill on your cardiac medications before your next appointment, please call your pharmacy*   Lab Work: Lipid panel & direct LDL  If you have labs (blood work) drawn today and your tests are completely normal, you will receive your results only by: MyChart Message (if you have MyChart) OR A paper copy in the mail If you have any lab test that is abnormal or we need to change your treatment, we will call you to review the results.   Testing/Procedures: None   Follow-Up: At [facility], you and your health needs are our priority.  As part of our continuing mission to provide you with exceptional heart care, we have created designated Provider Care Teams.  These Care Teams include your primary Cardiologist (physician) and Advanced Practice Providers (APPs -  Physician Assistants and Nurse Practitioners) who all work together to provide you with the care you need, when you need it.   Your next appointment:   6 month(s)  Provider:   Yvonne Kendall, MD or Cadence Fransico Michael, [place]

## 2023-10-06 LAB — LIPID PANEL
Chol/HDL Ratio: 4.2 {ratio} (ref 0.0–5.0)
Cholesterol, Total: 143 mg/dL (ref 100–199)
HDL: 34 mg/dL — ABNORMAL LOW (ref >39)
LDL Chol Calc (NIH): 78 mg/dL (ref 0–99)
Triglycerides: 183 mg/dL — ABNORMAL HIGH (ref 0–149)
VLDL Cholesterol Cal: 31 mg/dL (ref 5–40)

## 2023-10-06 LAB — LDL CHOLESTEROL, DIRECT: LDL Direct: 75 mg/dL (ref 0–99)

## 2023-10-10 ENCOUNTER — Other Ambulatory Visit: Payer: Self-pay

## 2023-10-10 ENCOUNTER — Ambulatory Visit: Payer: Medicaid Other | Admitting: Family Medicine

## 2023-10-10 VITALS — BP 127/79 | HR 73 | Ht 73.0 in | Wt 220.3 lb

## 2023-10-10 DIAGNOSIS — Z79899 Other long term (current) drug therapy: Secondary | ICD-10-CM | POA: Insufficient documentation

## 2023-10-10 DIAGNOSIS — R7303 Prediabetes: Secondary | ICD-10-CM

## 2023-10-10 DIAGNOSIS — G63 Polyneuropathy in diseases classified elsewhere: Secondary | ICD-10-CM | POA: Diagnosis not present

## 2023-10-10 DIAGNOSIS — Z23 Encounter for immunization: Secondary | ICD-10-CM

## 2023-10-10 DIAGNOSIS — I739 Peripheral vascular disease, unspecified: Secondary | ICD-10-CM

## 2023-10-10 DIAGNOSIS — I10 Essential (primary) hypertension: Secondary | ICD-10-CM

## 2023-10-10 DIAGNOSIS — Z716 Tobacco abuse counseling: Secondary | ICD-10-CM

## 2023-10-10 DIAGNOSIS — F17200 Nicotine dependence, unspecified, uncomplicated: Secondary | ICD-10-CM

## 2023-10-10 MED ORDER — METFORMIN HCL ER 500 MG PO TB24
1000.0000 mg | ORAL_TABLET | Freq: Every day | ORAL | 1 refills | Status: DC
  Filled 2023-10-10 – 2023-11-10 (×2): qty 180, 90d supply, fill #0
  Filled 2024-02-06: qty 180, 90d supply, fill #1

## 2023-10-10 MED ORDER — GABAPENTIN 100 MG PO CAPS
100.0000 mg | ORAL_CAPSULE | Freq: Every morning | ORAL | 0 refills | Status: DC
Start: 2023-10-10 — End: 2023-11-10
  Filled 2023-10-10: qty 30, 30d supply, fill #0

## 2023-10-10 MED ORDER — NICOTINE 21 MG/24HR TD PT24
21.0000 mg | MEDICATED_PATCH | Freq: Every day | TRANSDERMAL | 0 refills | Status: DC
  Filled 2023-10-10: qty 28, 28d supply, fill #0

## 2023-10-10 MED ORDER — CLOPIDOGREL BISULFATE 75 MG PO TABS
75.0000 mg | ORAL_TABLET | Freq: Every day | ORAL | 1 refills | Status: DC
Start: 2023-10-10 — End: 2023-11-10
  Filled 2023-10-10 – 2023-11-10 (×2): qty 90, 90d supply, fill #0

## 2023-10-10 MED ORDER — METOPROLOL SUCCINATE ER 100 MG PO TB24
100.0000 mg | ORAL_TABLET | Freq: Every day | ORAL | 1 refills | Status: DC
  Filled 2023-10-10: qty 90, 90d supply, fill #0
  Filled 2024-01-31 – 2024-02-06 (×2): qty 90, 90d supply, fill #1

## 2023-10-10 MED ORDER — LOSARTAN POTASSIUM 100 MG PO TABS
100.0000 mg | ORAL_TABLET | Freq: Every day | ORAL | 1 refills | Status: DC
  Filled 2023-10-10: qty 90, 90d supply, fill #0
  Filled 2024-01-31 – 2024-02-06 (×2): qty 90, 90d supply, fill #1

## 2023-10-10 NOTE — Assessment & Plan Note (Signed)
 Addressed as noted above.

## 2023-10-10 NOTE — Progress Notes (Signed)
 Established patient visit   Patient: Jason Livingston   DOB: Jul 30, 1966   58 y.o. Male  MRN: [id number] Visit Date: 10/10/2023  Today's healthcare provider: Sherlyn Hay, DO   Chief Complaint  Patient presents with   Nicotine Dependence    Smoking 1.5-2 packs per day    Subjective    HPI Jason Livingston is a 58 year old male who presents for smoking cessation and management of post-surgical nerve pain.  He is seeking assistance with smoking cessation. He has previously attempted to quit smoking using patches and gum. The gum was ineffective and had an unpleasant taste, while the patches were initially difficult but ultimately successful in helping him quit smoking in the past. He currently smokes about a pack and a half per day, rolling his own cigarettes. He is interested in trying Chantix for smoking cessation versus trying nicotine patches again.  He is experiencing nerve pain following revascularization for peripheral vascular disease. The pain is described as a burning sensation with numbness, particularly on the inside of his legs, and likened to 'broken glass underneath the skin.' The pain worsens with physical activity, such as exercises recommended by his physical therapist, with pain occurring the day after. He is currently taking gabapentin 300 mg at night but finds it too sedating to take in the morning. The pain is significant and impacts his daily activities.  He has a history of difficulty managing his prescriptions due to transportation issues and insurance limitations, resulting in frequent trips to the pharmacy. He is currently taking losartan, metoprolol, omeprazole, rosuvastatin, Plavix, metformin, vitamin B1, and thiamine. He recently quit drinking in July and is unsure if he needs to continue the vitamin B1.  He reports a past episode of pain and redness in his big toe, which was suspected to be gout, but the symptoms resolved after his surgery. He believes it may  have been related to circulation issues rather than gout.     Medications: Outpatient Medications Prior to Visit  Medication Sig   Accu-Chek Softclix Lancets lancets Use 1 each daily before breakfast.   acetaminophen (TYLENOL) 500 MG tablet Take 500-1,000 mg by mouth every 6 (six) hours as needed (pain.).   albuterol (VENTOLIN HFA) 108 (90 Base) MCG/ACT inhaler Inhale 2 puffs into the lungs every 6 (six) hours as needed for wheezing or shortness of breath.   aspirin EC 81 MG tablet Take 81 mg by mouth daily. Swallow whole.   Blood Glucose Monitoring Suppl (BLOOD GLUCOSE MONITOR SYSTEM) w/Device KIT Use as directed   chlorhexidine (PERIDEX) 0.12 % solution Use as directed 15 mLs in the mouth or throat 2 (two) times daily. Start on last day of antibiotic and take through appointment. (Patient taking differently: Use as directed 15 mLs in the mouth or throat daily. Start on last day of antibiotic and take through appointment.)   cyanocobalamin (VITAMIN B12) 1000 MCG tablet Take 1 tablet (1,000 mcg total) by mouth daily.   fluticasone furoate-vilanterol (BREO ELLIPTA) 200-25 MCG/ACT AEPB Inhale 1 puff into the lungs once daily.   gabapentin (NEURONTIN) 300 MG capsule Take 1 capsule (300 mg total) by mouth 2 (two) times daily.   Glucose Blood (BLOOD GLUCOSE TEST STRIPS) STRP Use daily before breakfast   Lancet Device MISC 1 each by Does not apply route daily before breakfast. May substitute to any manufacturer covered by patient's insurance.   metoprolol tartrate (LOPRESSOR) 25 MG tablet Take 1 tablet (25 mg total) by mouth  once daily as needed. (For elevated heart rates or SVT).   omeprazole (PRILOSEC) 20 MG capsule Take 1 capsule (20 mg total) by mouth once daily.   rosuvastatin (CRESTOR) 40 MG tablet Take 1 tablet (40 mg total) by mouth daily.   thiamine (VITAMIN B-1) 100 MG tablet Take 1 tablet (100 mg total) by mouth once daily.   [DISCONTINUED] clopidogrel (PLAVIX) 75 MG tablet Take 1 tablet  (75 mg total) by mouth daily at 6 (six) AM.   [DISCONTINUED] losartan (COZAAR) 100 MG tablet Take 1 tablet (100 mg total) by mouth daily.   [DISCONTINUED] metFORMIN (GLUCOPHAGE-XR) 500 MG 24 hr tablet Take 2 tablets (1,000 mg total) by mouth once daily with breakfast.   [DISCONTINUED] metoprolol succinate (TOPROL XL) 100 MG 24 hr tablet Take 1 tablet (100 mg total) by mouth once daily.   [DISCONTINUED] oxyCODONE (OXY IR/ROXICODONE) 5 MG immediate release tablet Take 1-2 tablets (5-10 mg total) by mouth every 4 (four) hours as needed for moderate pain (pain score 4-6). (Patient not taking: Reported on 10/10/2023)   No facility-administered medications prior to visit.        Objective    BP 127/79 (BP Location: Left Arm, Patient Position: Sitting, Cuff Size: Normal)   Pulse 73   Ht 6\' 1"  (1.854 m)   Wt 220 lb 4.8 oz (99.9 kg)   SpO2 98%   BMI 29.07 kg/m     Physical Exam Vitals and nursing note reviewed.  Constitutional:      General: He is not in acute distress.    Appearance: Normal appearance.  HENT:     Head: Normocephalic and atraumatic.  Eyes:     General: No scleral icterus.    Conjunctiva/sclera: Conjunctivae normal.  Cardiovascular:     Rate and Rhythm: Normal rate.  Pulmonary:     Effort: Pulmonary effort is normal.  Neurological:     Mental Status: He is alert and oriented to person, place, and time. Mental status is at baseline.  Psychiatric:        Mood and Affect: Mood normal.        Behavior: Behavior normal.      No results found for any visits on 10/10/23.  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 Currently on gabapentin 300 mg twice daily but unable to tolerate the morning dose due to sedation. Pain exacerbated by physical activity. Discussed adjusting the dose to 100 mg in the morning to reduce sedation while maintaining pain  control. - Adjust gabapentin to 300 mg at night and 100 mg in the morning - Follow-up with vascular on Thursday to assess pain management  Orders: -     Gabapentin; Take 1 capsule (100 mg total) by mouth in the morning.  Dispense: 30 capsule; Refill: 0 -     Clopidogrel Bisulfate; Take 1 tablet (75 mg total) by mouth daily at 6 (six) AM.  Dispense: 90 tablet; Refill: 1  Essential hypertension Assessment & Plan: Well-controlled. Continue losartan 100 mg daily Continue metoprolol succinate 100 mg daily Continue metoprolol tartrate 25 mg PRN palpitations s/t SVT (patient has not needed this recently)  Orders: -     Metoprolol Succinate ER; Take 1 tablet (100 mg total) by mouth once daily.  Dispense: 90 tablet; Refill: 1 -     Losartan Potassium; Take 1 tablet (100 mg total) by mouth daily.  Dispense: 90  tablet; Refill: 1  Pre-diabetes -     metFORMIN HCl ER; Take 2 tablets (1,000 mg total) by mouth once daily with breakfast.  Dispense: 180 tablet; Refill: 1  Encounter for smoking cessation counseling Assessment & Plan: Currently smoking approximately 1.5 packs/day. Previously quit using patches but relapsed. Prefers to try nicotine patches again over chantix due to pill burden. Discussed Chantix, including potential side effects such as vivid dreams, and bupropion and the need to start it one week prior to the planned quit date. - Prescribe nicotine patches - Follow-up in one month to assess progress  Orders: -     Nicotine; Place 1 patch (21 mg total) onto the skin daily.  Dispense: 28 patch; Refill: 0  Nicotine dependence with current use Assessment & Plan: Addressed as noted above.   Orders: -     Nicotine; Place 1 patch (21 mg total) onto the skin daily.  Dispense: 28 patch; Refill: 0  Need for shingles vaccine -     Varicella-zoster vaccine IM  Medication management Assessment & Plan: Difficulty managing multiple medications due to transportation issues and insurance  limitations. Currently on losartan, metoprolol, omeprazole, rosuvastatin, Plavix, metformin, vitamin B1, and B12. Discussed aligning medication refills and obtaining 90-day supplies to reduce pharmacy visits. - Send refills for medications with 90-day supplies where possible - Patient to discuss aligning prescriptions so he can fill as many as possible simultaneously. - Send 30-day supply of gabapentin due to dosage adjustment   General Health Maintenance Declined COVID vaccine but open to other vaccinations. Last tetanus vaccine uncertain but likely within the last decade. Discussed importance of shingles and pneumonia vaccines. Agreed to shingles vaccine today and will check records for last tetanus vaccine. - Administer shingles vaccine today - Check records for last tetanus vaccine and report back - Discuss pneumonia vaccine at next visit  Follow-up - Follow-up in one month to assess smoking cessation progress and pain management - Monitor response to shingles vaccine and plan for second dose in 1-6 months.   Return in about 4 weeks (around 11/07/2023) for Smoking cessation.      I discussed the assessment and treatment plan with the patient  The patient was provided an opportunity to ask questions and all were answered. The patient agreed with the plan and demonstrated an understanding of the instructions.   The patient was advised to call back or seek an in-person evaluation if the symptoms worsen or if the condition fails to improve as anticipated.    Sherlyn Hay, DO  [facility] [phone] (phone) [phone] (fax)  [facility]

## 2023-10-10 NOTE — Assessment & Plan Note (Addendum)
 Well-controlled. Continue losartan 100 mg daily Continue metoprolol succinate 100 mg daily Continue metoprolol tartrate 25 mg PRN palpitations s/t SVT (patient has not needed this recently)

## 2023-10-10 NOTE — Assessment & Plan Note (Signed)
 Reports severe nerve pain and numbness in the leg post-revascularization. Currently on gabapentin 300 mg twice daily but unable to tolerate the morning dose due to sedation. Pain exacerbated by physical activity. Discussed adjusting the dose to 100 mg in the morning to reduce sedation while maintaining pain control. - Adjust gabapentin to 300 mg at night and 100 mg in the morning - Follow-up with vascular on Thursday to assess pain management

## 2023-10-10 NOTE — Assessment & Plan Note (Signed)
 Difficulty managing multiple medications due to transportation issues and insurance limitations. Currently on losartan, metoprolol, omeprazole, rosuvastatin, Plavix, metformin, vitamin B1, and B12. Discussed aligning medication refills and obtaining 90-day supplies to reduce pharmacy visits. - Send refills for medications with 90-day supplies where possible - Patient to discuss aligning prescriptions so he can fill as many as possible simultaneously. - Send 30-day supply of gabapentin due to dosage adjustment

## 2023-10-10 NOTE — Assessment & Plan Note (Signed)
 Currently smoking approximately 1.5 packs/day. Previously quit using patches but relapsed. Prefers to try nicotine patches again over chantix due to pill burden. Discussed Chantix, including potential side effects such as vivid dreams, and bupropion and the need to start it one week prior to the planned quit date. - Prescribe nicotine patches - Follow-up in one month to assess progress

## 2023-10-12 ENCOUNTER — Encounter (INDEPENDENT_AMBULATORY_CARE_PROVIDER_SITE_OTHER): Payer: Self-pay | Admitting: Nurse Practitioner

## 2023-10-12 ENCOUNTER — Ambulatory Visit (INDEPENDENT_AMBULATORY_CARE_PROVIDER_SITE_OTHER): Payer: Medicaid Other | Admitting: Nurse Practitioner

## 2023-10-12 VITALS — BP 117/76 | HR 76 | Resp 18 | Ht 73.0 in | Wt 220.4 lb

## 2023-10-12 DIAGNOSIS — I70222 Atherosclerosis of native arteries of extremities with rest pain, left leg: Secondary | ICD-10-CM

## 2023-10-13 NOTE — Progress Notes (Signed)
 Subjective:    Patient ID: Jason Livingston, male    DOB: 11/05/1965, 58 y.o.   MRN: [id number] Chief Complaint  Patient presents with   Follow-up    : F/u 2-3 weeks  wound check    The patient is a 58 year old male that presents today after intervention on 08/30/2023 including:  PROCEDURE: 1. Bilateral common femoral and profunda femoris endarterectomy with bovine pericardial patch angioplasty. 2. Introduction catheter into the aorta bilateral common femoral artery approach. 3. Shockwave lithotripsy to the bilateral external iliac arteries. 4. Stent placement to the external iliac arteries bilaterally. 5. Application of a disposable Prevena incisional VAC   He has continued numbness and discomfort in his thighs.  His pain is fairly well-controlled with pain medication.  He was prescribed gabapentin but he notes that the daytime dose makes him too sleepy so this was recently decreased to 100 mg by his PCP.  He still continues to have claudication in the left lower extremity but nothing as significant as it was prior to intervention.  Today his bilateral wounds are clean dry and intact with no evidence of dehiscence or infection present today.     Review of Systems  Cardiovascular:  Negative for leg swelling.  Skin:  Positive for wound.  All other systems reviewed and are negative.      Objective:   Physical Exam Vitals reviewed.  HENT:     Head: Normocephalic.  Cardiovascular:     Rate and Rhythm: Normal rate.     Pulses:          Dorsalis pedis pulses are detected w/ Doppler on the right side and detected w/ Doppler on the left side.       Posterior tibial pulses are detected w/ Doppler on the right side and detected w/ Doppler on the left side.  Pulmonary:     Effort: Pulmonary effort is normal.  Skin:    General: Skin is warm and dry.  Neurological:     Mental Status: He is alert and oriented to person, place, and time.  Psychiatric:        Mood and Affect: Mood  normal.        Behavior: Behavior normal.        Thought Content: Thought content normal.        Judgment: Judgment normal.     BP 117/76   Pulse 76   Resp 18   Ht 6\' 1"  (1.854 m)   Wt 220 lb 6.4 oz (100 kg)   BMI 29.08 kg/m   Past Medical History:  Diagnosis Date   Arthritis    Asthma    Dyspnea    Elevated lipids 12/17/2020   Encounter to establish care 11/25/2020   GERD (gastroesophageal reflux disease)    Hypertension    Neuromuscular disorder (HCC)    Pre-diabetes    Sleep apnea    SVT (supraventricular tachycardia) (HCC)    Type 2 diabetes mellitus without complication, without long-term current use of insulin (HCC) 03/27/2023    Social History   Socioeconomic History   Marital status: Single    Spouse name: Not on file   Number of children: Not on file   Years of education: Not on file   Highest education level: Bachelor's degree (e.g., BA, AB, BS)  Occupational History   Not on file  Tobacco Use   Smoking status: Every Day    Current packs/day: 1.50    Average packs/day: 1.5 packs/day for 37.0 years (55.5  ttl pk-yrs)    Types: Cigarettes   Smokeless tobacco: Former    Quit date: 2012  Vaping Use   Vaping status: Never Used  Substance and Sexual Activity   Alcohol use: Yes    Alcohol/week: 14.0 standard drinks of alcohol    Types: 14 Cans of beer per week    Comment: last use end of March 2024 "1 beer per day"; hx of 2 40oz beers at night to sleep, 02/18/21 20oz with dinner everynight   Drug use: Not Currently    Types: Cocaine, Marijuana, LSD    Comment: used in his 20's, has not used in 30 years   Sexual activity: Not Currently  Other Topics Concern   Not on file  Social History Narrative   Not on file   Social Drivers of Health   Financial Resource Strain: High Risk (10/10/2023)   Overall Financial Resource Strain (CARDIA)    Difficulty of Paying Living Expenses: Very hard  Food Insecurity: Food Insecurity Present (10/10/2023)   Hunger Vital  Sign    Worried About Running Out of Food in the Last Year: Often true    Ran Out of Food in the Last Year: Often true  Transportation Needs: Unmet Transportation Needs (10/10/2023)   PRAPARE - Transportation    Lack of Transportation (Medical): Yes    Lack of Transportation (Non-Medical): Yes  Physical Activity: Insufficiently Active (10/10/2023)   Exercise Vital Sign    Days of Exercise per Week: 2 days    Minutes of Exercise per Session: 30 min  Stress: Stress Concern Present (10/10/2023)   [employer] of Occupational Health - Occupational Stress Questionnaire    Feeling of Stress : To some extent  Social Connections: Moderately Integrated (10/10/2023)   Social Connection and Isolation Panel [NHANES]    Frequency of Communication with Friends and Family: Twice a week    Frequency of Social Gatherings with Friends and Family: Once a week    Attends Religious Services: More than 4 times per year    Active Member of Clubs or Organizations: Yes    Attends Banker Meetings: More than 4 times per year    Marital Status: Never married  Intimate Partner Violence: Not At Risk (08/30/2023)   Humiliation, Afraid, Rape, and Kick questionnaire    Fear of Current or Ex-Partner: No    Emotionally Abused: No    Physically Abused: No    Sexually Abused: No    Past Surgical History:  Procedure Laterality Date   COLONOSCOPY N/A 03/09/2021   Procedure: COLONOSCOPY;  Surgeon: Wyline Mood, MD;  Location: [facility] ENDOSCOPY;  Service: Gastroenterology;  Laterality: N/A;   ENDARTERECTOMY FEMORAL Bilateral 08/30/2023   Procedure: ENDARTERECTOMY FEMORAL;  Surgeon: Renford Dills, MD;  Location: [facility] ORS;  Service: Vascular;  Laterality: Bilateral;   INSERTION OF ILIAC STENT  08/30/2023   Procedure: INSERTION OF ILIAC STENT;  Surgeon: Renford Dills, MD;  Location: [facility] ORS;  Service: Vascular;;   LOWER EXTREMITY ANGIOGRAPHY Left 07/25/2023   Procedure: Lower Extremity Angiography;   Surgeon: Renford Dills, MD;  Location: [facility] INVASIVE CV LAB;  Service: Cardiovascular;  Laterality: Left;   MANDIBLE FRACTURE SURGERY      Family History  Problem Relation Age of Onset   Other Mother        unknown medical history   Other Father        unknwon medical history   Hypertension Maternal Grandmother    Heart attack Maternal Grandfather 50  Heart disease Maternal Grandfather    Other Cousin        kidney transplant    No Known Allergies     Latest Ref Rng & Units 08/31/2023   12:29 AM 08/30/2023    8:00 PM 08/29/2023    3:16 PM  CBC  WBC 4.0 - 10.5 K/uL 18.5  14.4  8.5   Hemoglobin 13.0 - 17.0 g/dL 16.1  09.6  04.5   Hematocrit 39.0 - 52.0 % 40.8  39.4  39.3   Platelets 150 - 400 K/uL 255  256  278       CMP     Component Value Date/Time   NA 138 08/31/2023 0029   NA 139 05/10/2023 1632   K 4.1 08/31/2023 0029   CL 107 08/31/2023 0029   CO2 21 (L) 08/31/2023 0029   GLUCOSE 137 (H) 08/31/2023 0029   BUN 13 08/31/2023 0029   BUN 10 05/10/2023 1632   CREATININE 0.76 08/31/2023 0029   CALCIUM 8.6 (L) 08/31/2023 0029   PROT 6.9 05/10/2023 1632   ALBUMIN 4.6 05/10/2023 1632   AST 17 05/10/2023 1632   ALT 15 05/10/2023 1632   ALKPHOS 64 05/10/2023 1632   BILITOT 0.3 05/10/2023 1632   EGFR 102 05/10/2023 1632   GFRNONAA >60 08/31/2023 0029     No results found.     Assessment & Plan:   1. 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  The numbness he is experiencing in his thighs is nerve related pain from the surgery itself.  His claudication in the left lower extremity additionally is caused by a likely arterial stenosis based on his most recent ABIs.  Will have her return for arterial duplex and next office visit in 6 weeks   Current Outpatient Medications on File Prior to Visit  Medication Sig Dispense Refill   Accu-Chek Softclix Lancets lancets Use 1 each daily  before breakfast. 100 each 3   acetaminophen (TYLENOL) 500 MG tablet Take 500-1,000 mg by mouth every 6 (six) hours as needed (pain.).     albuterol (VENTOLIN HFA) 108 (90 Base) MCG/ACT inhaler Inhale 2 puffs into the lungs every 6 (six) hours as needed for wheezing or shortness of breath. 18 g 0   aspirin EC 81 MG tablet Take 81 mg by mouth daily. Swallow whole.     Blood Glucose Monitoring Suppl (BLOOD GLUCOSE MONITOR SYSTEM) w/Device KIT Use as directed 1 kit 0   chlorhexidine (PERIDEX) 0.12 % solution Use as directed 15 mLs in the mouth or throat 2 (two) times daily. Start on last day of antibiotic and take through appointment. (Patient taking differently: Use as directed 15 mLs in the mouth or throat daily. Start on last day of antibiotic and take through appointment.) 473 mL 2   clopidogrel (PLAVIX) 75 MG tablet Take 1 tablet (75 mg total) by mouth daily at 6 (six) AM. 90 tablet 1   cyanocobalamin (VITAMIN B12) 1000 MCG tablet Take 1 tablet (1,000 mcg total) by mouth daily. 90 tablet 1   fluticasone furoate-vilanterol (BREO ELLIPTA) 200-25 MCG/ACT AEPB Inhale 1 puff into the lungs once daily. 60 each 3   gabapentin (NEURONTIN) 100 MG capsule Take 1 capsule (100 mg total) by mouth in the morning. 30 capsule 0   gabapentin (NEURONTIN) 300 MG capsule Take 1 capsule (300 mg total) by mouth 2 (two) times daily. 60 capsule 0   Glucose Blood (BLOOD  GLUCOSE TEST STRIPS) STRP Use daily before breakfast 100 strip 3   Lancet Device MISC 1 each by Does not apply route daily before breakfast. May substitute to any manufacturer covered by patient's insurance. 1 each 0   losartan (COZAAR) 100 MG tablet Take 1 tablet (100 mg total) by mouth daily. 90 tablet 1   metFORMIN (GLUCOPHAGE-XR) 500 MG 24 hr tablet Take 2 tablets (1,000 mg total) by mouth once daily with breakfast. 180 tablet 1   metoprolol succinate (TOPROL XL) 100 MG 24 hr tablet Take 1 tablet (100 mg total) by mouth once daily. 90 tablet 1    metoprolol tartrate (LOPRESSOR) 25 MG tablet Take 1 tablet (25 mg total) by mouth once daily as needed. (For elevated heart rates or SVT). 30 tablet 1   nicotine (NICODERM CQ - DOSED IN MG/24 HOURS) 21 mg/24hr patch Place 1 patch (21 mg total) onto the skin daily. 28 patch 0   omeprazole (PRILOSEC) 20 MG capsule Take 1 capsule (20 mg total) by mouth once daily. 90 capsule 1   rosuvastatin (CRESTOR) 40 MG tablet Take 1 tablet (40 mg total) by mouth daily. 90 tablet 3   thiamine (VITAMIN B-1) 100 MG tablet Take 1 tablet (100 mg total) by mouth once daily. 90 tablet 3   No current facility-administered medications on file prior to visit.    There are no Patient Instructions on file for this visit. No follow-ups on file.   Georgiana Spinner, NP

## 2023-10-18 ENCOUNTER — Ambulatory Visit: Payer: Medicaid Other | Admitting: Family Medicine

## 2023-10-24 NOTE — Congregational Nurse Program (Signed)
  Dept: [phone]   Congregational Nurse Program Note  Date of Encounter: 10/24/2023 Client to [facility] day center nurse led clinic with questions regarding some information that he received in the mail regarding his vascular stents placed last month. RN instructed client to keep the cards in his wallet. Client voiced understanding and agreement. Madolyn Frieze Past Medical History: Past Medical History:  Diagnosis Date   Arthritis    Asthma    Dyspnea    Elevated lipids 12/17/2020   Encounter to establish care 11/25/2020   GERD (gastroesophageal reflux disease)    Hypertension    Neuromuscular disorder (HCC)    Pre-diabetes    Sleep apnea    SVT (supraventricular tachycardia) (HCC)    Type 2 diabetes mellitus without complication, without long-term current use of insulin (HCC) 03/27/2023    Encounter Details:  Community Questionnaire - 10/24/23 1255       Questionnaire   Ask client: Do you give verbal consent for me to treat you today? Yes    Student Assistance N/A    Location Patient Served  [facility]    Encounter Setting CN site    Population Status Unknown   client does ave housing through a Scientist, research (life sciences)   Insurance Medicaid   [place] [facility] health   Insurance/Financial Assistance Referral N/A    Medication N/A   client is able to obtain medications precribed  from [facility] [place]   Medical Provider Yes   [facility], Jacquenette Shone MD   Screening Referrals Made N/A   Client has a chest CT scheduled at 2:30 today 9/24   Medical Referrals Made N/A   RN attempted to make a dental apt for client, called 3 no new patient apts avail. until Feb 2024   Medical Appointment Completed N/A    CNP Interventions Advocate/Support;Educate   assisted client with some [employer] insurance questions and concerns. Contacted medicaid direct to find dental providers in this area   Screenings CN Performed N/A    ED Visit Averted N/A     Life-Saving Intervention Made N/A

## 2023-11-09 ENCOUNTER — Ambulatory Visit: Payer: Medicaid Other | Admitting: Family Medicine

## 2023-11-10 ENCOUNTER — Ambulatory Visit (INDEPENDENT_AMBULATORY_CARE_PROVIDER_SITE_OTHER): Admitting: Family Medicine

## 2023-11-10 ENCOUNTER — Other Ambulatory Visit: Payer: Self-pay

## 2023-11-10 ENCOUNTER — Encounter: Payer: Self-pay | Admitting: Family Medicine

## 2023-11-10 ENCOUNTER — Other Ambulatory Visit: Payer: Self-pay | Admitting: Physician Assistant

## 2023-11-10 VITALS — BP 127/74 | HR 65 | Ht 73.0 in | Wt 223.7 lb

## 2023-11-10 DIAGNOSIS — G479 Sleep disorder, unspecified: Secondary | ICD-10-CM

## 2023-11-10 DIAGNOSIS — Z5941 Food insecurity: Secondary | ICD-10-CM

## 2023-11-10 DIAGNOSIS — Z8719 Personal history of other diseases of the digestive system: Secondary | ICD-10-CM

## 2023-11-10 DIAGNOSIS — Z716 Tobacco abuse counseling: Secondary | ICD-10-CM | POA: Diagnosis not present

## 2023-11-10 DIAGNOSIS — Z5986 Financial insecurity: Secondary | ICD-10-CM

## 2023-11-10 DIAGNOSIS — I739 Peripheral vascular disease, unspecified: Secondary | ICD-10-CM | POA: Diagnosis not present

## 2023-11-10 DIAGNOSIS — G63 Polyneuropathy in diseases classified elsewhere: Secondary | ICD-10-CM

## 2023-11-10 DIAGNOSIS — Z5982 Transportation insecurity: Secondary | ICD-10-CM

## 2023-11-10 DIAGNOSIS — Z09 Encounter for follow-up examination after completed treatment for conditions other than malignant neoplasm: Secondary | ICD-10-CM | POA: Insufficient documentation

## 2023-11-10 DIAGNOSIS — F17200 Nicotine dependence, unspecified, uncomplicated: Secondary | ICD-10-CM | POA: Diagnosis not present

## 2023-11-10 DIAGNOSIS — E119 Type 2 diabetes mellitus without complications: Secondary | ICD-10-CM

## 2023-11-10 DIAGNOSIS — E1169 Type 2 diabetes mellitus with other specified complication: Secondary | ICD-10-CM

## 2023-11-10 DIAGNOSIS — Z7984 Long term (current) use of oral hypoglycemic drugs: Secondary | ICD-10-CM

## 2023-11-10 MED ORDER — ACCU-CHEK SOFTCLIX LANCETS MISC
1.0000 | Freq: Every day | 3 refills | Status: DC
  Filled 2023-11-10: qty 100, 90d supply, fill #0
  Filled 2024-02-06: qty 100, 100d supply, fill #0

## 2023-11-10 MED ORDER — NICOTINE 21 MG/24HR TD PT24
21.0000 mg | MEDICATED_PATCH | Freq: Every day | TRANSDERMAL | 0 refills | Status: DC
  Filled 2023-11-10: qty 28, 28d supply, fill #0

## 2023-11-10 MED ORDER — CLOPIDOGREL BISULFATE 75 MG PO TABS
75.0000 mg | ORAL_TABLET | Freq: Every day | ORAL | 1 refills | Status: DC
  Filled 2023-11-10: qty 90, 90d supply, fill #0
  Filled 2024-02-06: qty 90, 90d supply, fill #1

## 2023-11-10 MED ORDER — OMEPRAZOLE 20 MG PO CPDR
20.0000 mg | DELAYED_RELEASE_CAPSULE | Freq: Every day | ORAL | 1 refills | Status: DC
  Filled 2023-11-10: qty 90, 90d supply, fill #0
  Filled 2024-02-06: qty 90, 90d supply, fill #1

## 2023-11-10 MED ORDER — TRAZODONE HCL 50 MG PO TABS
25.0000 mg | ORAL_TABLET | Freq: Every evening | ORAL | 1 refills | Status: DC | PRN
  Filled 2023-11-10: qty 30, 30d supply, fill #0
  Filled 2024-02-06: qty 30, 30d supply, fill #1

## 2023-11-10 MED ORDER — BLOOD GLUCOSE TEST VI STRP
1.0000 | ORAL_STRIP | Freq: Every day | 3 refills | Status: AC
  Filled 2023-11-10: qty 100, 100d supply, fill #0
  Filled 2024-02-09: qty 100, 100d supply, fill #1
  Filled 2024-05-08: qty 100, 100d supply, fill #2

## 2023-11-10 NOTE — Progress Notes (Signed)
 Established patient visit   Patient: Jason Livingston   DOB: [birth year]   58 y.o. Male  MRN: [id number] Visit Date: 11/10/2023  Today's healthcare provider: Sherlyn Hay, DO   Chief Complaint  Patient presents with   smoking cessation   Subjective    HPI Chanler Mendonca is a 58 year old male with diabetes and peripheral vascular disease who presents for follow-up on smoking cessation and medication management.  He is using nicotine patches to aid in smoking cessation, struggling most with cravings before bed. Occasionally, he takes a drag or two from a cigarette but primarily relies on the patches. He has not used nicotine gum or lozenges but uses sugar-free lifesavers to manage cravings.  He is concerned about his diabetes management, particularly his A1c levels. He recalls recent blood work but is unsure if it included an A1c test. He is currently on metformin and monitors his blood glucose levels, although he notes a discrepancy in the number of lancets and test strips he receives.  He experiences ongoing pain in both legs, for which gabapentin was previously prescribed. However, he discontinued gabapentin due to vivid nightmares and a sense of losing control, with no recurrence of these symptoms since stopping the medication. He is currently on clopidogrel and rosuvastatin following recent surgery related to cholesterol and vascular issues. He notes difficulty in obtaining his medication due to insurance limitations and transportation challenges.  He has difficulty sleeping, which was initially helped by gabapentin. However, he now struggles with both falling and staying asleep. He has tried melatonin without success and reports vivid dreams related to alcohol and pill use, despite not having issues with these substances.  He reports food insecurity due to a lapse in EBT benefits while hospitalized, resulting in limited funds for groceries. He has difficulty accessing  transportation for medical appointments and prescription refills, relying on church members for assistance when possible.      Medications: Outpatient Medications Prior to Visit  Medication Sig   Accu-Chek Softclix Lancets lancets Use 1 each daily before breakfast.   acetaminophen (TYLENOL) 500 MG tablet Take 500-1,000 mg by mouth every 6 (six) hours as needed (pain.).   albuterol (VENTOLIN HFA) 108 (90 Base) MCG/ACT inhaler Inhale 2 puffs into the lungs every 6 (six) hours as needed for wheezing or shortness of breath.   aspirin EC 81 MG tablet Take 81 mg by mouth daily. Swallow whole.   Blood Glucose Monitoring Suppl (BLOOD GLUCOSE MONITOR SYSTEM) w/Device KIT Use as directed   chlorhexidine (PERIDEX) 0.12 % solution Use as directed 15 mLs in the mouth or throat 2 (two) times daily. Start on last day of antibiotic and take through appointment. (Patient taking differently: Use as directed 15 mLs in the mouth or throat daily. Start on last day of antibiotic and take through appointment.)   cyanocobalamin (VITAMIN B12) 1000 MCG tablet Take 1 tablet (1,000 mcg total) by mouth daily.   fluticasone furoate-vilanterol (BREO ELLIPTA) 200-25 MCG/ACT AEPB Inhale 1 puff into the lungs once daily.   gabapentin (NEURONTIN) 300 MG capsule Take 1 capsule (300 mg total) by mouth 2 (two) times daily.   Lancet Device MISC 1 each by Does not apply route daily before breakfast. May substitute to any manufacturer covered by patient's insurance.   losartan (COZAAR) 100 MG tablet Take 1 tablet (100 mg total) by mouth daily.   metFORMIN (GLUCOPHAGE-XR) 500 MG 24 hr tablet Take 2 tablets (1,000 mg total) by mouth once daily  with breakfast.   metoprolol succinate (TOPROL XL) 100 MG 24 hr tablet Take 1 tablet (100 mg total) by mouth once daily.   metoprolol tartrate (LOPRESSOR) 25 MG tablet Take 1 tablet (25 mg total) by mouth once daily as needed. (For elevated heart rates or SVT).   rosuvastatin (CRESTOR) 40 MG  tablet Take 1 tablet (40 mg total) by mouth daily.   thiamine (VITAMIN B-1) 100 MG tablet Take 1 tablet (100 mg total) by mouth once daily.   [DISCONTINUED] clopidogrel (PLAVIX) 75 MG tablet Take 1 tablet (75 mg total) by mouth daily at 6 (six) AM.   [DISCONTINUED] gabapentin (NEURONTIN) 100 MG capsule Take 1 capsule (100 mg total) by mouth in the morning.   [DISCONTINUED] Glucose Blood (BLOOD GLUCOSE TEST STRIPS) STRP Use daily before breakfast   [DISCONTINUED] nicotine (NICODERM CQ - DOSED IN MG/24 HOURS) 21 mg/24hr patch Place 1 patch (21 mg total) onto the skin daily.   [DISCONTINUED] omeprazole (PRILOSEC) 20 MG capsule Take 1 capsule (20 mg total) by mouth once daily.   No facility-administered medications prior to visit.        Objective    BP 127/74   Pulse 65   Ht 6\' 1"  (1.854 m)   Wt 223 lb 11.2 oz (101.5 kg)   SpO2 96%   BMI 29.51 kg/m     Physical Exam Vitals and nursing note reviewed.  Constitutional:      General: He is not in acute distress.    Appearance: Normal appearance.  HENT:     Head: Normocephalic and atraumatic.  Eyes:     General: No scleral icterus.    Conjunctiva/sclera: Conjunctivae normal.  Cardiovascular:     Rate and Rhythm: Normal rate.  Pulmonary:     Effort: Pulmonary effort is normal.  Neurological:     Mental Status: He is alert and oriented to person, place, and time. Mental status is at baseline.  Psychiatric:        Mood and Affect: Mood normal.        Behavior: Behavior normal.      Results for orders placed or performed in visit on 11/10/23  Hemoglobin A1c  Result Value Ref Range   Hgb A1c MFr Bld 6.3 (H) 4.8 - 5.6 %   Est. average glucose Bld gHb Est-mCnc 134 mg/dL    Assessment & Plan    Follow-up exam, less than 3 months since previous exam  Encounter for smoking cessation counseling -     Nicotine; Place 1 patch (21 mg total) onto the skin daily.  Dispense: 28 patch; Refill: 0  Nicotine dependence with current  use -     Nicotine; Place 1 patch (21 mg total) onto the skin daily.  Dispense: 28 patch; Refill: 0  Type 2 diabetes mellitus without complication, without long-term current use of insulin (HCC) -     Hemoglobin A1c -     Accu-Chek Softclix Lancets; 1 each daily before breakfast. May substitute to any manufacturer covered by patient's insurance.  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  Dispense: 100 tablet; Refill: 1  History of gastroesophageal reflux (GERD) -     Omeprazole; Take 1 capsule (20 mg total) by mouth once daily.  Dispense: 90 capsule; Refill: 1  Sleep disturbance -     traZODone HCl; Take 0.5-1 tablets (25-50 mg total) by mouth at bedtime as needed for sleep.  Dispense: 30 tablet; Refill: 1  Transportation insecurity limiting access to food -     AMB Referral VBCI Care Management  Financial insecurity -     AMB Referral VBCI Care Management     Nicotine dependence with current use; smoking cessation counseling Using nicotine patches with minimal smoking. Concerned about sugar intake from lifesavers due to diabetes. Discussed nicotine gum or lozenges as alternatives. - Continue nicotine patches. - Consider nicotine gum or lozenges for cravings. - Encourage sugar-free alternatives for lifesavers.  Diabetes Mellitus type II Confusion about recent blood work; confirmed no recent A1c test. On metformin; having test strip supply issues.  A1c 6.3 today. - Order A1c test. - Ensure adequate supply of test strips. - Continue metformin.  Peripheral Vascular Disease with neuropathy On clopidogrel and rosuvastatin. Bilateral leg pain previously managed with gabapentin, discontinued due to nightmares. Issues with Plavix prescription refills due to insurance  constraints. - Continue clopidogrel and rosuvastatin. - Reassess pain management options. - Resend Plavix prescription and explore 100-day supply options.  Sleep disturbance Difficulty falling and staying asleep. Gabapentin discontinued due to side effects. Discussed trazodone for sleep, addressing concerns about side effects. - Prescribe trazodone at the lowest dose. - Educate on potential side effects and use of a pill cutter.  Food Insecurity; transportation insecurity Experiencing food insecurity due to EBT and transportation issues. Offered pantry bag and discussed referral to social work. - Provide food pantry bag. - Refer to social work for additional resources and transportation assistance.    Return in about 3 months (around 02/10/2024) for DM.      I discussed the assessment and treatment plan with the patient  The patient was provided an opportunity to ask questions and all were answered. The patient agreed with the plan and demonstrated an understanding of the instructions.   The patient was advised to call back or seek an in-person evaluation if the symptoms worsen or if the condition fails to improve as anticipated.    Sherlyn Hay, DO  [facility] [phone] (phone) [phone] (fax)  [facility]

## 2023-11-11 LAB — HEMOGLOBIN A1C
Est. average glucose Bld gHb Est-mCnc: 134 mg/dL
Hgb A1c MFr Bld: 6.3 % — ABNORMAL HIGH (ref 4.8–5.6)

## 2023-11-14 ENCOUNTER — Telehealth: Payer: Self-pay

## 2023-11-14 NOTE — Telephone Encounter (Signed)
 Medical record request received from Disability services. Records printed and faxed.

## 2023-11-14 NOTE — Progress Notes (Signed)
 Care Guide Pharmacy Note  11/14/2023 Name: Aldean Pipe MRN: [id number] DOB: 09/18/1965  Referred By: Sherlyn Hay, DO Reason for referral: Care Coordination (Outreach to schedule with Pharm d )   Sreekar Broyhill is a 58 y.o. year old male who is a primary care patient of Sherlyn Hay, DO.  Dugan Vanhoesen was referred to the pharmacist for assistance related to:  med assistance   An unsuccessful telephone outreach was attempted today to contact the patient who was referred to the pharmacy team for assistance with medication assistance. Additional attempts will be made to contact the patient.  Penne Lash , RMA     [facility]  [facility] Guide  Direct Dial: [phone]  Website: [email or web address]

## 2023-11-15 ENCOUNTER — Telehealth: Payer: Self-pay

## 2023-11-15 NOTE — Progress Notes (Signed)
 Complex Care Management Note Care Guide Note  11/15/2023 Name: Jason Livingston MRN: [id number] DOB: 11/12/1965  Harshal Sirmon is a 58 y.o. year old male who is a primary care patient of Sherlyn Hay, DO . The community resource team was consulted for assistance with Transportation Needs   SDOH screenings and interventions completed:  Yes  Social Drivers of Health From This Encounter   Food Insecurity: Food Insecurity Present (11/15/2023)   Hunger Vital Sign    Worried About Running Out of Food in the Last Year: Sometimes true    Ran Out of Food in the Last Year: Sometimes true  Housing: Patient Declined (11/15/2023)   Housing Stability Vital Sign    Unable to Pay for Housing in the Last Year: Patient declined    Number of Times Moved in the Last Year: 0    Homeless in the Last Year: Patient declined  Financial Resource Strain: Medium Risk (11/15/2023)   Overall Financial Resource Strain (CARDIA)    Difficulty of Paying Living Expenses: Somewhat hard  Transportation Needs: Unmet Transportation Needs (11/15/2023)   PRAPARE - Administrator, Civil Service (Medical): Yes    Lack of Transportation (Non-Medical): Yes  Utilities: Not At Risk (11/15/2023)   Utilities    Threatened with loss of utilities: No    SDOH Interventions Today    Flowsheet Row Most Recent Value  SDOH Interventions   Food Insecurity Interventions Other (Comment)  [Patient stated he has a list of food pantries. He is using [place] transit transportation to get around town.]  Housing Interventions Patient Declined  Transportation Interventions Other (Comment)  [Patient has transportation benefit with [employer] Medicaid and uses [employer] transit to get around town.]        Care guide performed the following interventions: Patient provided with information about care guide support team and interviewed to confirm resource needs.  Follow Up Plan:  No further follow up planned at this time. The patient  has been provided with needed resources.  Encounter Outcome:  Patient Visit Completed  [facility]  [facility] Guide Direct Dial: [phone]  Fax: [phone] Website: [email or web address]

## 2023-11-21 ENCOUNTER — Encounter: Payer: Self-pay | Admitting: Family Medicine

## 2023-11-22 ENCOUNTER — Other Ambulatory Visit (INDEPENDENT_AMBULATORY_CARE_PROVIDER_SITE_OTHER): Payer: Self-pay | Admitting: Nurse Practitioner

## 2023-11-22 DIAGNOSIS — I739 Peripheral vascular disease, unspecified: Secondary | ICD-10-CM

## 2023-11-23 ENCOUNTER — Encounter (INDEPENDENT_AMBULATORY_CARE_PROVIDER_SITE_OTHER): Payer: Self-pay | Admitting: Vascular Surgery

## 2023-11-23 ENCOUNTER — Ambulatory Visit (INDEPENDENT_AMBULATORY_CARE_PROVIDER_SITE_OTHER): Payer: Medicaid Other

## 2023-11-23 ENCOUNTER — Encounter: Payer: Medicaid Other | Attending: Family Medicine | Admitting: Dietician

## 2023-11-23 ENCOUNTER — Ambulatory Visit (INDEPENDENT_AMBULATORY_CARE_PROVIDER_SITE_OTHER): Payer: Medicaid Other | Admitting: Vascular Surgery

## 2023-11-23 ENCOUNTER — Encounter: Payer: Self-pay | Admitting: Dietician

## 2023-11-23 VITALS — BP 140/83 | HR 57 | Resp 16 | Wt 227.0 lb

## 2023-11-23 DIAGNOSIS — E782 Mixed hyperlipidemia: Secondary | ICD-10-CM

## 2023-11-23 DIAGNOSIS — E119 Type 2 diabetes mellitus without complications: Secondary | ICD-10-CM

## 2023-11-23 DIAGNOSIS — I739 Peripheral vascular disease, unspecified: Secondary | ICD-10-CM

## 2023-11-23 DIAGNOSIS — J4540 Moderate persistent asthma, uncomplicated: Secondary | ICD-10-CM

## 2023-11-23 DIAGNOSIS — Z9889 Other specified postprocedural states: Secondary | ICD-10-CM

## 2023-11-23 DIAGNOSIS — I70222 Atherosclerosis of native arteries of extremities with rest pain, left leg: Secondary | ICD-10-CM

## 2023-11-23 DIAGNOSIS — I10 Essential (primary) hypertension: Secondary | ICD-10-CM

## 2023-11-23 NOTE — Progress Notes (Signed)
 MRN : [id number]  Jason Livingston is a 58 y.o. ([birth year]) male who presents with chief complaint of check circulation.  History of Present Illness:   Patient returns for follow-up regarding open surgical endarterectomy with intervention on 08/30/2023 including:   PROCEDURE: 1.Bilateral common femoral and profunda femoris endarterectomy with bovine pericardial patch angioplasty. 2.Introduction catheter into the aorta bilateral common femoral artery approach. 3.Shockwave lithotripsy to the bilateral external iliac arteries. 4.Stent placement to the external iliac arteries bilaterally. 5.Application of a disposable Prevena incisional VAC.   The patient reports that his left lower extremity claudication symptoms have returned and have gotten significantly worse.  He is also noting perhaps some mild rest pain symptoms.  He denies any open wounds or sores.  He is concerned because postoperatively he seemed to have much less symptoms related to claudication.  With respect to the numbness in the thighs which has been vexing him quite a bit this does appear to be improving slowly.  Duplex ultrasound of the left lower extremity demonstrates monophasic signals within the SFA which is a significant transition compared to the previous study.   No outpatient medications have been marked as taking for the 11/23/23 encounter (Appointment) with Prescilla Brod, Ninette Basque, MD.    Past Medical History:  Diagnosis Date   Arthritis    Asthma    Dyspnea    Elevated lipids 12/17/2020   Encounter to establish care 11/25/2020   GERD (gastroesophageal reflux disease)    Hypertension    Neuromuscular disorder (HCC)    Pre-diabetes    Sleep apnea    SVT (supraventricular tachycardia) (HCC)    Type 2 diabetes mellitus without complication, without long-term current use of insulin (HCC) 03/27/2023    Past Surgical History:  Procedure  Laterality Date   COLONOSCOPY N/A 03/09/2021   Procedure: COLONOSCOPY;  Surgeon: Luke Salaam, MD;  Location: [facility] ENDOSCOPY;  Service: Gastroenterology;  Laterality: N/A;   ENDARTERECTOMY FEMORAL Bilateral 08/30/2023   Procedure: ENDARTERECTOMY FEMORAL;  Surgeon: Jackquelyn Mass, MD;  Location: [facility] ORS;  Service: Vascular;  Laterality: Bilateral;   INSERTION OF ILIAC STENT  08/30/2023   Procedure: INSERTION OF ILIAC STENT;  Surgeon: Jackquelyn Mass, MD;  Location: [facility] ORS;  Service: Vascular;;   LOWER EXTREMITY ANGIOGRAPHY Left 07/25/2023   Procedure: Lower Extremity Angiography;  Surgeon: Jackquelyn Mass, MD;  Location: [facility] INVASIVE CV LAB;  Service: Cardiovascular;  Laterality: Left;   MANDIBLE FRACTURE SURGERY      Social History Social History   Tobacco Use   Smoking status: Every Day    Current packs/day: 1.50    Average packs/day: 1.5 packs/day for 37.0 years (55.5 ttl pk-yrs)    Types: Cigarettes   Smokeless tobacco: Former    Quit date: 2012  Vaping Use   Vaping status: Never Used  Substance Use Topics   Alcohol use: Yes    Alcohol/week: 14.0 standard drinks of alcohol    Types: 14 Cans of beer per week    Comment: last use end of March 2024 "1 beer per day"; hx of 2 40oz beers at night to  sleep, 02/18/21 20oz with dinner everynight   Drug use: Not Currently    Types: Cocaine, Marijuana, LSD    Comment: used in his 20's, has not used in 30 years    Family History Family History  Problem Relation Age of Onset   Other Mother        unknown medical history   Other Father        unknwon medical history   Hypertension Maternal Grandmother    Heart attack Maternal Grandfather 76   Heart disease Maternal Grandfather    Other Cousin        kidney transplant    No Known Allergies   REVIEW OF SYSTEMS (Negative unless checked)  Constitutional: [] Weight loss  [] Fever  [] Chills Cardiac: [] Chest pain   [] Chest pressure   [] Palpitations   [] Shortness of breath  when laying flat   [] Shortness of breath with exertion. Vascular:  [x] Pain in legs with walking   [] Pain in legs at rest  [] History of DVT   [] Phlebitis   [] Swelling in legs   [] Varicose veins   [] Non-healing ulcers Pulmonary:   [] Uses home oxygen   [] Productive cough   [] Hemoptysis   [] Wheeze  [] COPD   [] Asthma Neurologic:  [] Dizziness   [] Seizures   [] History of stroke   [] History of TIA  [] Aphasia   [] Vissual changes   [] Weakness or numbness in arm   [] Weakness or numbness in leg Musculoskeletal:   [] Joint swelling   [] Joint pain   [] Low back pain Hematologic:  [] Easy bruising  [] Easy bleeding   [] Hypercoagulable state   [] Anemic Gastrointestinal:  [] Diarrhea   [] Vomiting  [] Gastroesophageal reflux/heartburn   [] Difficulty swallowing. Genitourinary:  [] Chronic kidney disease   [] Difficult urination  [] Frequent urination   [] Blood in urine Skin:  [] Rashes   [] Ulcers  Psychological:  [] History of anxiety   []  History of major depression.  Physical Examination  There were no vitals filed for this visit. There is no height or weight on file to calculate BMI. Gen: WD/WN, NAD Head: [place]/AT, No temporalis wasting.  Ear/Nose/Throat: Hearing grossly intact, nares w/o erythema or drainage Eyes: PER, EOMI, sclera nonicteric.  Neck: Supple, no masses.  No bruit or JVD.  Pulmonary:  Good air movement, no audible wheezing, no use of accessory muscles.  Cardiac: RRR, normal S1, S2, no Murmurs. Vascular:  mild trophic changes, no open wounds Vessel Right Left  Radial Palpable Palpable  PT  Palpable Not Palpable  DP  Palpable Not Palpable  Gastrointestinal: soft, non-distended. No guarding/no peritoneal signs.  Musculoskeletal: M/S 5/5 throughout.  No visible deformity.  Neurologic: CN 2-12 intact. Pain and light touch intact in extremities.  Symmetrical.  Speech is fluent. Motor exam as listed above. Psychiatric: Judgment intact, Mood & affect appropriate for pt's clinical situation. Dermatologic: No  rashes or ulcers noted.  No changes consistent with cellulitis.   CBC Lab Results  Component Value Date   WBC 18.5 (H) 08/31/2023   HGB 13.9 08/31/2023   HCT 40.8 08/31/2023   MCV 93.2 08/31/2023   PLT 255 08/31/2023    BMET    Component Value Date/Time   NA 138 08/31/2023 0029   NA 139 05/10/2023 1632   K 4.1 08/31/2023 0029   CL 107 08/31/2023 0029   CO2 21 (L) 08/31/2023 0029   GLUCOSE 137 (H) 08/31/2023 0029   BUN 13 08/31/2023 0029   BUN 10 05/10/2023 1632   CREATININE 0.76 08/31/2023 0029   CALCIUM 8.6 (L) 08/31/2023 0029  GFRNONAA >60 08/31/2023 0029   CrCl cannot be calculated (Patient's most recent lab result is older than the maximum 21 days allowed.).  COAG No results found for: "INR", "PROTIME"  Radiology No results found.   Assessment/Plan 1. Atherosclerosis of native artery of left lower extremity with rest pain (HCC) (Primary) Recommend:  The patient has evidence of severe atherosclerotic changes of both lower extremities with rest pain that is associated with preulcerative changes and impending tissue loss of the left foot.  This is concerning given his initial improvement after surgery and that his occlusive disease was overwhelmingly inflow.  His SFA popliteal and tibial vessels were patent.  I am very concerned that this is his a hyperplastic lesion that should be treated before his iliac interventions thrombosis.  I believe this represents a limb threatening ischemia and places the patient at the risk for left limb loss.  Patient should undergo angiography of the left lower extremity with the hope for intervention for limb salvage.  The risks and benefits as well as the alternative therapies was discussed in detail with the patient.  All questions were answered.  Patient agrees to proceed with left lower extremity angiography.  The patient will follow up with me in the office after the procedure.   2. Essential hypertension Continue  antihypertensive medications as already ordered, these medications have been reviewed and there are no changes at this time.  3. Moderate persistent asthma without complication Continue pulmonary medications and aerosols as already ordered, these medications have been reviewed and there are no changes at this time.   4. Type 2 diabetes mellitus without complication, without long-term current use of insulin (HCC) Continue hypoglycemic medications as already ordered, these medications have been reviewed and there are no changes at this time.  Hgb A1C to be monitored as already arranged by primary service  5. Mixed hyperlipidemia Continue statin as ordered and reviewed, no changes at this time d    Devon Fogo, MD  11/23/2023 3:42 PM

## 2023-11-23 NOTE — Progress Notes (Signed)
 Diabetes Self-Management Education  Visit Type: Follow-up  Appt. Start Time: 1400 Appt. End Time: 1435  11/23/2023  [name], identified by name and date of birth, is a 58 y.o. male with a diagnosis of Diabetes:  .   ASSESSMENT Pt reports concern over inability to control DM, surprised in improvement in A1c but states they wanted it to be lower.  Pt reports FBG is running between 115-125 mg/dL Pt reports improved mobility, but still is dealing with nerve pain in both legs, pt reports continuing PT exercises at home/walking. Pt reports starting Trazodone for sleep, reports nominal relief. Pt reports EBT has kicked back in since last visit, is now able to get adequate food. Pt reports applying for disability but is having trouble with the application process. Pt reports frustration with the amount/cost of medication they are having to take, states they want to be able to come off of as much medication as possible. Pt reports trying to quit smoking, wearing nicotine patches but states they are not working well.  Pt reports drinking ~12 cups (5 oz.) worth of coffee each morning, states this curbs their appetite and won't eat any food until 1:00 pm.   Diabetes Self-Management Education - 11/23/23 1500       Visit Information   Visit Type Follow-up      Pre-Education Assessment   Patient understands the diabetes disease and treatment process. Needs Review    Patient understands incorporating nutritional management into lifestyle. Needs Review    Patient undertands incorporating physical activity into lifestyle. Needs Review    Patient understands using medications safely. Demonstrates understanding / competency    Patient understands monitoring blood glucose, interpreting and using results Comprehends key points    Patient understands prevention, detection, and treatment of acute complications. Needs Review    Patient understands prevention, detection, and treatment of chronic  complications. Needs Review    Patient understands how to develop strategies to address psychosocial issues. Needs Review    Patient understands how to develop strategies to promote health/change behavior. Needs Review      Complications   Last HgB A1C per patient/outside source 6.3 %   11/10/2023   How often do you check your blood sugar? 3-4 times / week    Fasting Blood glucose range (mg/dL) 16-109    Postprandial Blood glucose range (mg/dL) 604-540      Dietary Intake   Breakfast 2 pots of coffee w/ 2% milk and sugar    Lunch None    Dinner Breaded chicken breast, sweet potato, stufifng, cranberry sauce    Beverage(s) Coffe, water      Activity / Exercise   Activity / Exercise Type ADL's;Light (walking / raking leaves)    How many days per week do you exercise? 2    How many minutes per day do you exercise? 20    Total minutes per week of exercise 40      Patient Self-Evaluation of Goals - Patient rates self as meeting previously set goals (% of time)   Nutrition 25 - 50% (sometimes)    Physical Activity 25 - 50% (sometimes)    Medications >75% (most of the time)    Monitoring >75% (most of the time)    Problem Solving and behavior change strategies  50 - 75 % (half of the time)    Reducing Risk (treating acute and chronic complications) 50 - 75 % (half of the time)    Health Coping 25 - 50% (sometimes)  Post-Education Assessment   Patient understands the diabetes disease and treatment process. Comprehends key points    Patient understands incorporating nutritional management into lifestyle. Comprehends key points    Patient undertands incorporating physical activity into lifestyle. Comprehends key points    Patient understands using medications safely. Demonstrates understanding / competency    Patient understands monitoring blood glucose, interpreting and using results Comprehends key points    Patient understands prevention, detection, and treatment of acute  complications. Comprehends key points    Patient understands prevention, detection, and treatment of chronic complications. Comprehends key points    Patient understands how to develop strategies to address psychosocial issues. Comprehends key points    Patient understands how to develop strategies to promote health/change behavior. Comprehends key points      Outcomes   Expected Outcomes Demonstrated interest in learning but significant barriers to change    Future DMSE 3-4 months    Program Status Not Completed      Subsequent Visit   Since your last visit have you continued or begun to take your medications as prescribed? Yes    Since your last visit have you had your blood pressure checked? Yes    Is your most recent blood pressure lower, unchanged, or higher since your last visit? Lower    Since your last visit have you experienced any weight changes? No change    Since your last visit, are you checking your blood glucose at least once a day? No   Ran out of test strips, hasn't been checking as consistently            Individualized Plan for Diabetes Self-Management Training:   Learning Objective:  Patient will have a greater understanding of diabetes self-management. Patient education plan is to attend individual and/or group sessions per assessed needs and concerns.   Plan:   Patient Instructions  Begin to reduce your coffee intake each morning to allow your appetite to return for breakfast food.  Continue to be active every day! Walk for 10-20 minutes a few times each day!  Keep up the hard work towards quitting smoking! This can be a huge key in improving all aspects of your health and lowering your medication bills!!  Your fasting blood sugar goal is below 110 mg/dL!   Expected Outcomes:  Demonstrated interest in learning but significant barriers to change  Education material provided: ADA - How to Thrive: A Guide for Your Journey with Diabetes  If problems or  questions, patient to contact team via:  Phone and Email  Future DSME appointment: 3-4 months

## 2023-11-23 NOTE — Patient Instructions (Addendum)
 Begin to reduce your coffee intake each morning to allow your appetite to return for breakfast food.  Continue to be active every day! Walk for 10-20 minutes a few times each day!  Keep up the hard work towards quitting smoking! This can be a huge key in improving all aspects of your health and lowering your medication bills!!  Your fasting blood sugar goal is below 110 mg/dL!

## 2023-11-25 ENCOUNTER — Encounter (INDEPENDENT_AMBULATORY_CARE_PROVIDER_SITE_OTHER): Payer: Self-pay | Admitting: Vascular Surgery

## 2023-12-13 ENCOUNTER — Telehealth (INDEPENDENT_AMBULATORY_CARE_PROVIDER_SITE_OTHER): Payer: Self-pay

## 2023-12-13 NOTE — Telephone Encounter (Signed)
 Spoke with the patient and he is scheduled with Dr. Prescilla Brod for a left leg angio on 12/19/23 with a 9:00 am arrival time to the [facility]. Pre-procedure instructions were discussed and will be sent to Mychart and mailed.

## 2023-12-19 ENCOUNTER — Encounter: Admission: RE | Payer: Self-pay | Source: Home / Self Care

## 2023-12-19 ENCOUNTER — Ambulatory Visit: Admission: RE | Admit: 2023-12-19 | Source: Home / Self Care | Admitting: Vascular Surgery

## 2023-12-19 DIAGNOSIS — I70229 Atherosclerosis of native arteries of extremities with rest pain, unspecified extremity: Secondary | ICD-10-CM

## 2023-12-19 SURGERY — LOWER EXTREMITY INTERVENTION
Anesthesia: Moderate Sedation | Site: Leg Lower | Laterality: Left

## 2023-12-25 NOTE — Progress Notes (Signed)
 Care Guide Pharmacy Note  12/25/2023 Name: Jason Livingston MRN: [id number] DOB: 1965-09-02  Referred By: Carlean Charter, DO Reason for referral: Care Coordination (Outreach to schedule with Pharm d )   Ashish Khouri is a 58 y.o. year old male who is a primary care patient of Carlean Charter, DO.  [name] was referred to the pharmacist for assistance related to: HTN and HLD  Successful contact was made with the patient to discuss pharmacy services including being ready for the pharmacist to call at least 5 minutes before the scheduled appointment time and to have medication bottles and any blood pressure readings ready for review. The patient agreed to meet with the pharmacist via telephone visit on (date/time).01/09/2024  Lenton Rail , RMA     [facility], [facility] Guide  Direct Dial: [phone]  Website: [email or web address]

## 2024-01-02 ENCOUNTER — Encounter (INDEPENDENT_AMBULATORY_CARE_PROVIDER_SITE_OTHER): Payer: Self-pay

## 2024-01-09 ENCOUNTER — Other Ambulatory Visit: Payer: Self-pay

## 2024-01-09 ENCOUNTER — Telehealth: Payer: Self-pay

## 2024-01-09 DIAGNOSIS — Z79899 Other long term (current) drug therapy: Secondary | ICD-10-CM

## 2024-01-09 NOTE — Progress Notes (Signed)
   01/09/2024  Patient ID: Jason Livingston, male   DOB: 23-Jul-1966, 58 y.o.   MRN: [id number]  Attempted to contact patient for medication management/review. Left HIPAA compliant message for patient to return my call at their convenience.   First attempt for patient outreach. Will follow up with patient to reschedule appointment. Per Dr. Anson Basta, patient's fill history for certain medications was not consistent. Confirmed patient has [place] Medicaid insurance. Will verify cost and current SDOH resources he is receiving. Additionally, will organization of pills.   Thank you for allowing pharmacy to be a part of this patient's care.  Alexandria Angel, PharmD Clinical Pharmacist Cell: [phone]

## 2024-01-16 ENCOUNTER — Telehealth: Payer: Self-pay

## 2024-01-16 NOTE — Progress Notes (Signed)
 Complex Care Management Care Guide Note  01/16/2024 Name: Oval Cavazos MRN: [id number] DOB: Nov 10, 1965  Colbi Staubs is a 58 y.o. year old male who is a primary care patient of Pardue, Asencion Blacksmith, DO and is actively engaged with the care management team. I reached out to Kristian Petty by phone today to assist with re-scheduling  with the Pharmacist.  Follow up plan: Telephone appointment with complex care management team member scheduled for:  01/17/24 at 3:00 p.m.   [facility]  [facility], [facility] Health Care Management Assistant Direct Dial: [phone]  Fax: [phone]

## 2024-01-17 ENCOUNTER — Telehealth: Payer: Self-pay

## 2024-01-17 ENCOUNTER — Other Ambulatory Visit: Payer: Self-pay

## 2024-01-17 DIAGNOSIS — Z79899 Other long term (current) drug therapy: Secondary | ICD-10-CM

## 2024-01-17 NOTE — Progress Notes (Signed)
   01/17/2024  Patient ID: Jason Livingston, male   DOB: 1966/05/15, 58 y.o.   MRN: [id number]  Attempted to contact patient for medication management/review. Left HIPAA compliant message for patient to return my call at their convenience.   Second attempt for patient outreach. Will follow up with patient to reschedule appointment. Per Dr. Anson Basta, patient's fill history for certain medications was not consistent. Confirmed patient has [place] Medicaid insurance with pharmacy patient advocate team.   Thank you for allowing pharmacy to be a part of this patient's care.  Alexandria Angel, PharmD Clinical Pharmacist Cell: [phone]

## 2024-01-22 ENCOUNTER — Telehealth: Payer: Self-pay

## 2024-01-22 NOTE — Progress Notes (Signed)
 Complex Care Management Care Guide Note  01/22/2024 Name: Jason Livingston MRN: [id number] DOB: 03/10/1966  [name] is a 58 y.o. year old male who is a primary care patient of Pardue, Asencion Blacksmith, DO and is actively engaged with the care management team. I reached out to Kristian Petty by phone today to assist with re-scheduling  with the Pharmacist.  Follow up plan: Telephone appointment with complex care management team member scheduled for:  01/23/24 at 2:00 p.m.   [facility]  [facility], [facility] Health Care Management Assistant Direct Dial: [phone]  Fax: [phone]

## 2024-01-23 ENCOUNTER — Other Ambulatory Visit: Payer: Self-pay

## 2024-01-23 ENCOUNTER — Telehealth: Payer: Self-pay

## 2024-01-23 DIAGNOSIS — Z79899 Other long term (current) drug therapy: Secondary | ICD-10-CM

## 2024-01-23 NOTE — Progress Notes (Signed)
   01/23/2024  Patient ID: Jason Livingston, male   DOB: Oct 09, 1965, 58 y.o.   MRN: [id number]  Attempted to contact patient for medication management/review. Left HIPAA compliant message for patient to return my call at their convenience.    Will follow up with patient to possibly reschedule an appointment, if interested in services. Reviewed medication adherence portal prior to outreach.   Thank you for allowing pharmacy to be a part of this patient's care.  Alexandria Angel, PharmD Clinical Pharmacist Cell: [phone]

## 2024-01-31 ENCOUNTER — Other Ambulatory Visit (HOSPITAL_COMMUNITY): Payer: Self-pay

## 2024-01-31 ENCOUNTER — Telehealth: Payer: Self-pay

## 2024-01-31 ENCOUNTER — Other Ambulatory Visit: Payer: Self-pay

## 2024-01-31 DIAGNOSIS — I10 Essential (primary) hypertension: Secondary | ICD-10-CM

## 2024-01-31 NOTE — Progress Notes (Addendum)
 01/31/2024 Name: Jason Livingston MRN: [id number] DOB: 1966/02/27  Chief Complaint  Patient presents with   Medication Adherence   Medication Assistance    Jason Livingston is a 58 y.o. year old male who presented for a telephone visit.   They were referred to the pharmacist by their PCP for assistance in managing medication access and medication adherence. The patient was contacted today by Care Management Assistant Dreama Agent to reschedule an appointment with the clinical pharmacist. He was adamant that he had not received any calls from the pharmacist. Although he was not on the pharmacist's schedule today, he was contacted regarding a referral need. He answered the cold-call outreach today. During the conversation with the pharmacist, the patient stated that he had not been receiving calls from this pharmacist but eventually attributed the missed calls from prior scheduled appointments to issues with his "not-so-smart" smartphone is what he stated.   Subjective:  Care Team: Primary Care Provider: Donzella Lauraine SAILOR, DO ; Next Scheduled Visit: 02/09/2024 Nutritionist 02/09/2024  Medication Access/Adherence  Current Pharmacy:  [facility] [address] [place] [address] Phone: [phone] Fax: [phone]  [id number] [place] - [address] [facility] [address] [facility] [place] [address] Phone: [phone] Fax: [phone]   Patient reports affordability concerns with their medications: Yes  $4 for 30 or 90 days supply that is put on a payment plan account   Patient reports access/transportation concerns to their pharmacy: Yes  Previously spoke with care coordinator in April about transportation. During that encounter it was noted patient uses [employer] for transportation. I asked about this during the call, he states [employer] is a local service he uses that he pays $1 OOP per trip  (one-way). He states that he called [employer] transportation, but he states that he forgets to call 7 days in advance to schedule ride. Upcoming appointment on 02/09/2024 with PCP and he has not setup a ride.    Patient reports adherence concerns with their medications:  Yes  However, reports that he has gotten a lot better a taking his medications and is using a pillbox.      Diabetes:  Current medications: Metformin  XR 1000 mg daily,   Current glucose readings: FBG  115-130 Using BGM meter; testing one time daily    Patient reports hypoglycemic s/sx including dizziness yet did not know to check his BG. Patient reports hyperglycemic symptoms including neuropathy, blurred vision. Discussed to check BG with above sxs.    Hypertension:  Current medications: losartan  100 mg daily, metoprolol  succinate 100 mg daily, metoprolol  tartrate 25 mg daily PRN SVT sxs --  Per patient adherence portal, he has not been compliant with a low adherence rate for losartan  and metoprolol . He attributes to transportation and openly reported he was previously not adherent. He denies needing to use PRN metoprolol  tartrate often.    Patient does not have a validated, automated, upper arm home BP cuff Current blood pressure readings readings: N/A  Patient reports hypotensive s/sx including dizziness, lightheadedness.  Patient reports hypertensive symptoms including shortness of breath at rest and uses inhalers that he feels help SOB.    Hyperlipidemia/ASCVD Risk Reduction  Current lipid lowering medications: rosuvastatin  40 mg daily. Per patient adherence portal, he has not been compliant with a low adherence rate. He attributes to transportation and openly reported he was previously not adherent.  Medications tried in the past: Aspirin  81 mg daily   Medication Management:  Current adherence strategy: Pillbox left on kitchen table  Patient reports Poor adherence to  medications  Patient reports the following barriers to adherence: transportation and cost  - This pharmacist called [facility] to get medications refilled, was able to get the following medication refilled today and Mr. Gladney was setup for mail order:  losartan , rosuvastatin , trazodone , metoprolol  succinate, lancets  - Test strips, plavix , metformin , and omperazole were refilled too soon. Mr. Simkin will need to call back to refill medication as Medicaid patients are not allowed, per pharmacy, to be on automatic medication refill.   Regarding cost of medication, was informed patient could pay as much as he is able to afford each month since he has Medicaid.    Objective:  Lab Results  Component Value Date   HGBA1C 6.3 (H) 11/10/2023    Lab Results  Component Value Date   CREATININE 0.76 08/31/2023   BUN 13 08/31/2023   NA 138 08/31/2023   K 4.1 08/31/2023   CL 107 08/31/2023   CO2 21 (L) 08/31/2023    Lab Results  Component Value Date   CHOL 143 10/05/2023   HDL 34 (L) 10/05/2023   LDLCALC 78 10/05/2023   LDLDIRECT 75 10/05/2023   TRIG 183 (H) 10/05/2023   CHOLHDL 4.2 10/05/2023    Medications Reviewed Today     Reviewed by Cleatus Dorcas SAUNDERS, RPH (Pharmacist) on 01/31/24 at 1125  Med List Status: <None>   Medication Order Taking? Sig Documenting Provider Last Dose Status Informant  Accu-Chek Softclix Lancets lancets [id number] Yes Use 1 each daily before breakfast. Donzella Lauraine SAILOR, DO  Active Self  Accu-Chek Softclix Lancets lancets [id number]  1 each daily before breakfast. May substitute to any manufacturer covered by patient's insurance. Donzella Lauraine SAILOR, DO  Expired 12/10/23 2359   acetaminophen  (TYLENOL ) 500 MG tablet [id number] Yes Take 500-1,000 mg by mouth every 6 (six) hours as needed (pain.). [provider]  Active Self  albuterol  (VENTOLIN  HFA) 108 (90 Base) MCG/ACT inhaler [id number] Yes Inhale 2 puffs into the lungs every 6 (six) hours as  needed for wheezing or shortness of breath. Cyndi Shaver, PA-C  Active Self  aspirin  EC 81 MG tablet [id number] Yes Take 81 mg by mouth daily. Swallow whole. [provider]  Active Self  Blood Glucose Monitoring Suppl (BLOOD GLUCOSE MONITOR SYSTEM) w/Device KIT [id number] Yes Use as directed Donzella Lauraine SAILOR, DO  Active Self  chlorhexidine  (PERIDEX ) 0.12 % solution [id number] Yes Use as directed 15 mLs in the mouth or throat 2 (two) times daily. Start on last day of antibiotic and take through appointment. Donzella Lauraine SAILOR, DO  Active Self  clopidogrel  (PLAVIX ) 75 MG tablet [id number] Yes Take 1 tablet (75 mg total) by mouth daily at 6 (six) AM. Pardue, Sarah N, DO  Active   cyanocobalamin  (VITAMIN B12) 1000 MCG tablet [id number] Yes Take 1 tablet (1,000 mcg total) by mouth daily. Donzella Lauraine SAILOR, DO  Active Self  fluticasone  furoate-vilanterol (BREO ELLIPTA ) 200-25 MCG/ACT AEPB [id number] Yes Inhale 1 puff into the lungs once daily. Cyndi Shaver, PA-C  Active Self  gabapentin  (NEURONTIN ) 300 MG capsule [id number]  Take 1 capsule (300 mg total) by mouth 2 (two) times daily.  Patient not taking: Reported on 01/31/2024   Brown, Fallon E, NP  Active   Glucose Blood (BLOOD GLUCOSE TEST STRIPS) STRP [id number] Yes Use daily before breakfast Donzella Lauraine SAILOR,  DO  Active   Lancet Device MISC [id number] Yes 1 each by Does not apply route daily before breakfast. May substitute to any manufacturer covered by patient's insurance. Donzella Lauraine SAILOR, DO  Active Self  losartan  (COZAAR ) 100 MG tablet [id number] Yes Take 1 tablet (100 mg total) by mouth daily. Donzella Lauraine SAILOR, DO  Active   metFORMIN  (GLUCOPHAGE -XR) 500 MG 24 hr tablet [id number] Yes Take 2 tablets (1,000 mg total) by mouth once daily with breakfast. Pardue, Lauraine SAILOR, DO  Active   metoprolol  succinate (TOPROL  XL) 100 MG 24 hr tablet [id number] Yes Take 1 tablet (100 mg total) by mouth once daily. Donzella Lauraine SAILOR, DO  Active   metoprolol  tartrate  (LOPRESSOR ) 25 MG tablet [id number] Yes Take 1 tablet (25 mg total) by mouth once daily as needed. (For elevated heart rates or SVT). Cyndi Shaver, PA-C  Active Self  nicotine  (NICODERM CQ  - DOSED IN MG/24 HOURS) 21 mg/24hr patch [id number] Yes Place 1 patch (21 mg total) onto the skin daily. Donzella Lauraine SAILOR, DO  Active   omeprazole  (PRILOSEC) 20 MG capsule [id number] Yes Take 1 capsule (20 mg total) by mouth once daily. Donzella Lauraine SAILOR, DO  Active   rosuvastatin  (CRESTOR ) 40 MG tablet [id number] Yes Take 1 tablet (40 mg total) by mouth daily. Donzella Lauraine SAILOR, DO  Active Self  thiamine  (VITAMIN B-1) 100 MG tablet [id number] Yes Take 1 tablet (100 mg total) by mouth once daily. Cyndi Shaver, PA-C  Active Self  traZODone  (DESYREL ) 50 MG tablet [id number] Yes Take 0.5-1 tablets (25-50 mg total) by mouth at bedtime as needed for sleep. Donzella Lauraine SAILOR, DO  Active               Assessment/Plan:   Diabetes: - Currently controlled - Reviewed long term cardiovascular and renal outcomes of uncontrolled blood sugar - Reviewed goal A1c, goal fasting, and goal 2 hour post prandial glucose - Recommend to continue current therapy  - Recommend to check glucose once daily (fasting) and PRN for sxs of low BG    Hypertension: - Currently controlled, mostly. Patient reports that he endorses frequent dizzy episodes about four times per week. Encouraged patient to contact PCP and discuss during appointment next week.  - Reviewed long term cardiovascular and renal outcomes of uncontrolled blood pressure and importance of compliance  - Reviewed appropriate blood pressure monitoring technique and reviewed goal blood pressure. Recommended to check home blood pressure and heart rate two to three times weekly - Recommend to continue current regimen  - Will collaborate with PCP to order BP cuff/device via [facility]. Informed patient to expect call from company to verify address to deliver device.      Hyperlipidemia/ASCVD Risk Reduction: - Currently uncontrolled, LDL > 70.  - Reviewed long term complications of uncontrolled cholesterol and importance of compliance - Recommend to continue current regimen     Medication Management: - Currently strategy insufficient to maintain appropriate adherence to prescribed medication regimen - Suggested use of weekly pill box to organize medications as well as alarm - Helped patient refill medications.  - Encouraged patient to schedule transportation pick up, today or tomorrow, for his appointment next week with his PCP     Follow Up Plan: 4 weeks with PharmD  Dorcas Solian, PharmD Clinical Pharmacist Cell: [phone]

## 2024-01-31 NOTE — Progress Notes (Signed)
 Complex Care Management Care Guide Note  01/31/2024 Name: Jason Livingston MRN: [id number] DOB: 1966/07/03  Jason Livingston is a 58 y.o. year old male who is a primary care patient of Pardue, Asencion Blacksmith, DO and is actively engaged with the care management team. I reached out to Kristian Petty by phone today to assist with re-scheduling  with the Pharmacist.  Follow up plan: Patient requested other options besides telephone. Will call back with [facility] options and close referral per patient request.  [facility]  [facility], [facility] Management Assistant Direct Dial: [phone]  Fax: [phone]

## 2024-02-01 ENCOUNTER — Telehealth: Payer: Self-pay

## 2024-02-01 MED ORDER — BLOOD PRESSURE KIT KIT: PACK | 0 refills | Status: AC

## 2024-02-01 NOTE — Progress Notes (Signed)
   02/01/2024  Patient ID: Jason Livingston, male   DOB: 1965-10-01, 58 y.o.   MRN: [id number]  Care Coordination Call  I contacted [facility], provided the preliminary requested information, and am awaiting the PCP to verify the blood pressure order.  Alexandria Angel, PharmD Clinical Pharmacist Cell: [phone]

## 2024-02-02 ENCOUNTER — Telehealth: Payer: Self-pay

## 2024-02-02 NOTE — Progress Notes (Signed)
   02/02/2024  Patient ID: Jason Livingston, male   DOB: 03-26-1966, 58 y.o.   MRN: [id number]  Care Coordination Call  I called [facility] for an update on the status of the blood pressure device. The order had not been processed due to missing insurance information. I provided the updated insurance details to the company.  Alexandria Angel, PharmD Clinical Pharmacist Cell: [phone]

## 2024-02-05 ENCOUNTER — Other Ambulatory Visit (HOSPITAL_COMMUNITY): Payer: Self-pay

## 2024-02-06 ENCOUNTER — Other Ambulatory Visit: Payer: Self-pay

## 2024-02-06 ENCOUNTER — Telehealth: Payer: Self-pay | Admitting: Family Medicine

## 2024-02-06 ENCOUNTER — Encounter: Payer: Self-pay | Admitting: Family Medicine

## 2024-02-06 NOTE — Telephone Encounter (Signed)
 Message came thru for address to be changed but the correct address is already showing on the profile of the patient in Epic

## 2024-02-07 ENCOUNTER — Other Ambulatory Visit: Payer: Self-pay

## 2024-02-09 ENCOUNTER — Ambulatory Visit (INDEPENDENT_AMBULATORY_CARE_PROVIDER_SITE_OTHER): Admitting: Family Medicine

## 2024-02-09 ENCOUNTER — Encounter: Payer: Self-pay | Admitting: Family Medicine

## 2024-02-09 ENCOUNTER — Telehealth: Payer: Self-pay

## 2024-02-09 ENCOUNTER — Other Ambulatory Visit: Payer: Self-pay

## 2024-02-09 VITALS — BP 128/94 | HR 65 | Resp 16 | Ht 73.0 in | Wt 225.4 lb

## 2024-02-09 DIAGNOSIS — R42 Dizziness and giddiness: Secondary | ICD-10-CM | POA: Diagnosis not present

## 2024-02-09 DIAGNOSIS — F330 Major depressive disorder, recurrent, mild: Secondary | ICD-10-CM

## 2024-02-09 DIAGNOSIS — G63 Polyneuropathy in diseases classified elsewhere: Secondary | ICD-10-CM

## 2024-02-09 DIAGNOSIS — I152 Hypertension secondary to endocrine disorders: Secondary | ICD-10-CM

## 2024-02-09 DIAGNOSIS — Z23 Encounter for immunization: Secondary | ICD-10-CM | POA: Diagnosis not present

## 2024-02-09 DIAGNOSIS — E119 Type 2 diabetes mellitus without complications: Secondary | ICD-10-CM

## 2024-02-09 DIAGNOSIS — Z716 Tobacco abuse counseling: Secondary | ICD-10-CM

## 2024-02-09 DIAGNOSIS — I471 Supraventricular tachycardia, unspecified: Secondary | ICD-10-CM

## 2024-02-09 DIAGNOSIS — G479 Sleep disorder, unspecified: Secondary | ICD-10-CM

## 2024-02-09 DIAGNOSIS — F17200 Nicotine dependence, unspecified, uncomplicated: Secondary | ICD-10-CM

## 2024-02-09 DIAGNOSIS — F1091 Alcohol use, unspecified, in remission: Secondary | ICD-10-CM | POA: Diagnosis not present

## 2024-02-09 DIAGNOSIS — E1159 Type 2 diabetes mellitus with other circulatory complications: Secondary | ICD-10-CM | POA: Diagnosis not present

## 2024-02-09 DIAGNOSIS — I739 Peripheral vascular disease, unspecified: Secondary | ICD-10-CM

## 2024-02-09 DIAGNOSIS — J4540 Moderate persistent asthma, uncomplicated: Secondary | ICD-10-CM

## 2024-02-09 MED ORDER — TRAZODONE HCL 50 MG PO TABS
50.0000 mg | ORAL_TABLET | Freq: Every evening | ORAL | 3 refills | Status: DC | PRN
  Filled 2024-02-09 – 2024-05-08 (×2): qty 90, 90d supply, fill #0
  Filled 2024-07-26: qty 30, 30d supply, fill #1

## 2024-02-09 MED ORDER — CLOPIDOGREL BISULFATE 75 MG PO TABS
75.0000 mg | ORAL_TABLET | Freq: Every day | ORAL | 1 refills | Status: AC
  Filled 2024-02-09: qty 100, 100d supply, fill #0
  Filled 2024-05-08: qty 90, 90d supply, fill #0
  Filled 2024-07-10: qty 90, 90d supply, fill #1
  Filled 2024-07-26: qty 30, 30d supply, fill #1
  Filled 2024-08-23: qty 30, 30d supply, fill #2
  Filled 2024-09-20: qty 30, 30d supply, fill #3

## 2024-02-09 MED ORDER — FLUTICASONE FUROATE-VILANTEROL 200-25 MCG/ACT IN AEPB
1.0000 | INHALATION_SPRAY | Freq: Every day | RESPIRATORY_TRACT | 3 refills | Status: AC
  Filled 2024-02-09 – 2024-05-08 (×2): qty 60, 30d supply, fill #0
  Filled 2024-06-06 – 2024-07-10 (×2): qty 60, 30d supply, fill #1

## 2024-02-09 NOTE — Telephone Encounter (Signed)
 At 1548 attempted and called Sherryll Agent to follow up regarding pt getting medication delivered to his home. Per pt address change but his address change has been updated. So wanted to touch base regarding home delivery. No response when called left detailed message and for a call back.  At 1616 called Dorcas Solian, to follow up regarding pt Nicotine  patch. Pt stated he has not received a follow up as he was getting weekly, pt states he was getting a lot of spam calls and unsure if he told them to stop calling not knowing if it was a spam call or his weekly check in. No response when called, left message to call back.

## 2024-02-09 NOTE — Assessment & Plan Note (Signed)
 Doing well. 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

## 2024-02-09 NOTE — Progress Notes (Unsigned)
 Established patient visit   Patient: Jason Livingston   DOB: 1966-03-31   58 y.o. Male  MRN: [id number] Visit Date: 02/09/2024  Today's healthcare provider: LAURAINE LOISE BUOY, DO   Chief Complaint  Patient presents with   Diabetes   Subjective    Diabetes  Jason Livingston is a 58 year old male with SVT and hypertension who presents with dizziness and medication management issues.  He experiences dizziness described as lightheadedness rather than vertigo, with episodes occurring sporadically and sometimes associated with changes in position. He notes balance issues, having almost fallen into a wall the previous night. The sensation is similar to standing up too quickly, with momentary dimming of eyesight.  He has a history of SVT and is taking metoprolol . He also takes losartan  for hypertension. He has not checked his blood sugars for the past week due to lack of supplies, but previously, his levels were stable, ranging from double digits to a high of 115.  He is using nicotine  patches and lozenges to quit smoking but still smokes occasionally in the mornings and at night. He is on a 21 mg nicotine  patch and finds it challenging to use the lozenges in the morning due to his coffee routine.  He has a history of depression but does not feel the need for counseling or therapy, describing his mood issues as situational rather than chronic. He previously tried gabapentin  but discontinued it due to side effects, including nightmares.  His current medications include losartan  100 mg, metoprolol  succinate, omeprazole , vitamin B12, rosuvastatin , albuterol , Plavix , metformin , and trazodone . He has not refilled Breo Ellipta  and often forgets to take it. He is also using a chlorhexidine  gluconate mouthwash regularly. He has had issues with medication delivery due to address changes and has been unable to contact certain healthcare providers through his patient portal. He is working on resolving these  issues with the help of outreach services.      Medications: Outpatient Medications Prior to Visit  Medication Sig Note   Accu-Chek Softclix Lancets lancets Use 1 each daily before breakfast.    Accu-Chek Softclix Lancets lancets 1 each daily before breakfast. May substitute to any manufacturer covered by patient's insurance.    acetaminophen  (TYLENOL ) 500 MG tablet Take 500-1,000 mg by mouth every 6 (six) hours as needed (pain.).    albuterol  (VENTOLIN  HFA) 108 (90 Base) MCG/ACT inhaler Inhale 2 puffs into the lungs every 6 (six) hours as needed for wheezing or shortness of breath.    aspirin  EC 81 MG tablet Take 81 mg by mouth daily. Swallow whole.    Blood Glucose Monitoring Suppl (BLOOD GLUCOSE MONITOR SYSTEM) w/Device KIT Use as directed    Blood Pressure Monitoring (BLOOD PRESSURE KIT) KIT Use to check blood pressure daily as directed. Dx code I10.0    chlorhexidine  (PERIDEX ) 0.12 % solution Use as directed 15 mLs in the mouth or throat 2 (two) times daily. Start on last day of antibiotic and take through appointment.    cyanocobalamin  (VITAMIN B12) 1000 MCG tablet Take 1 tablet (1,000 mcg total) by mouth daily.    Glucose Blood (BLOOD GLUCOSE TEST STRIPS) STRP Use daily before breakfast    Lancet Device MISC 1 each by Does not apply route daily before breakfast. May substitute to any manufacturer covered by patient's insurance.    losartan  (COZAAR ) 100 MG tablet Take 1 tablet (100 mg total) by mouth daily.    metFORMIN  (GLUCOPHAGE -XR) 500 MG 24 hr tablet Take 2  tablets (1,000 mg total) by mouth once daily with breakfast.    metoprolol  succinate (TOPROL  XL) 100 MG 24 hr tablet Take 1 tablet (100 mg total) by mouth once daily.    metoprolol  tartrate (LOPRESSOR ) 25 MG tablet Take 1 tablet (25 mg total) by mouth once daily as needed. (For elevated heart rates or SVT).    nicotine  (NICODERM CQ  - DOSED IN MG/24 HOURS) 21 mg/24hr patch Place 1 patch (21 mg total) onto the skin daily.     omeprazole  (PRILOSEC) 20 MG capsule Take 1 capsule (20 mg total) by mouth once daily.    rosuvastatin  (CRESTOR ) 40 MG tablet Take 1 tablet (40 mg total) by mouth daily.    thiamine  (VITAMIN B-1) 100 MG tablet Take 1 tablet (100 mg total) by mouth once daily.    [DISCONTINUED] clopidogrel  (PLAVIX ) 75 MG tablet Take 1 tablet (75 mg total) by mouth daily at 6 (six) AM.    [DISCONTINUED] fluticasone  furoate-vilanterol (BREO ELLIPTA ) 200-25 MCG/ACT AEPB Inhale 1 puff into the lungs once daily.    [DISCONTINUED] traZODone  (DESYREL ) 50 MG tablet Take 0.5-1 tablets (25-50 mg total) by mouth at bedtime as needed for sleep. (Patient taking differently: Take 50 mg by mouth at bedtime as needed for sleep.)    [DISCONTINUED] gabapentin  (NEURONTIN ) 300 MG capsule Take 1 capsule (300 mg total) by mouth 2 (two) times daily. (Patient not taking: Reported on 01/31/2024) 02/09/2024: nightmares   No facility-administered medications prior to visit.        Objective    BP (!) 128/94 (BP Location: Right Arm)   Pulse 65   Resp 16   Ht 6' 1 (1.854 m)   Wt 225 lb 6.4 oz (102.2 kg)   SpO2 96%   BMI 29.74 kg/m     Physical Exam Vitals reviewed.  Constitutional:      General: He is not in acute distress.    Appearance: Normal appearance. He is not diaphoretic.  HENT:     Head: Normocephalic and atraumatic.  Eyes:     General: No scleral icterus.    Conjunctiva/sclera: Conjunctivae normal.  Neck:     Vascular: No carotid bruit.  Cardiovascular:     Rate and Rhythm: Normal rate and regular rhythm.     Pulses: Normal pulses.     Heart sounds: Normal heart sounds. No murmur heard. Pulmonary:     Effort: Pulmonary effort is normal. No respiratory distress.     Breath sounds: Normal breath sounds. No wheezing or rhonchi.  Musculoskeletal:     Cervical back: Neck supple.     Right lower leg: No edema.     Left lower leg: No edema.  Lymphadenopathy:     Cervical: No cervical adenopathy.  Skin:     General: Skin is warm and dry.     Findings: No rash.  Neurological:     Mental Status: He is alert and oriented to person, place, and time. Mental status is at baseline.  Psychiatric:        Mood and Affect: Mood normal.        Behavior: Behavior normal.      No results found for any visits on 02/09/24.  Assessment & Plan    Type 2 diabetes mellitus without complication, without long-term current use of insulin  (HCC)  Hypertension associated with diabetes (HCC)  Episodic lightheadedness  Alcohol use disorder in remission Assessment & Plan: Doing well. 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  Claudication [facility])  SVT (supraventricular tachycardia) (HCC)  Nicotine  dependence with current use  Encounter for smoking cessation counseling  Mild recurrent major depression (HCC)  Moderate persistent asthma without complication -     Fluticasone  Furoate-Vilanterol; Inhale 1 puff into the lungs once daily.  Dispense: 60 each; Refill: 3  Neuropathy due to peripheral vascular disease (HCC) -     Clopidogrel  Bisulfate; Take 1 tablet (75 mg total) by mouth daily at 6 (six) AM.  Dispense: 100 tablet; Refill: 1  Sleep disturbance -     traZODone  HCl; Take 1 tablet (50 mg total) by mouth at bedtime as needed for sleep.  Dispense: 90 tablet; Refill: 3  Need for shingles vaccine -     Varicella-zoster vaccine IM  Hepatitis B vaccination administered at current visit -     Heplisav-B (HepB-CPG) Vaccine     Type 2 Diabetes Mellitus Blood sugars previously well-controlled with metformin , monitoring interrupted due to lack of lancets. - Provide lancets for blood glucose monitoring. - Continue metformin  as prescribed.  Hypertension Chronic, stable.  Blood pressure overall well-controlled with losartan , compliant with regimen.  Some concern for possible orthostatic hypotension as noted.  Orthostatics negative; will continue to monitor. - Continue losartan  as  prescribed.  Lightheadedness Intermittent lightheadedness and balance issues possibly related to orthostatic hypotension or cardiovascular issues. - Orthostatic blood pressures/heart rates negative for change, making orthostatic hypotension less likely.  However, patient was asymptomatic during his visit. - Blood pressure cuff has been prescribed for monitoring, including during episodes.  He will be communicating with Dorcas Solian, [facility], regarding his recent change of address to ensure receipt of the new cuff. - Instruct to record blood pressure readings and bring to next visit.  Supraventricular Tachycardia (SVT) SVT managed with metoprolol , compliant with regimen.  Rhythm currently regular. - Continue metoprolol  as prescribed.  Nicotine  Dependence; smoking cessation counseling Using nicotine  patches and lozenges, still smokes in the morning and at night, attempting to wean off cigarettes. - Encourage continued use of nicotine  patches and lozenges. - Discuss strategies to avoid smoking, such as using lozenges instead of cigarettes in the morning and at night.  Depression Reports situational stress, denies significant depressive symptoms or suicidal ideation; declined counseling and group therapy.  Medication Management Issues Confusion regarding medication delivery to the correct address. - Ensure medication delivery is set up correctly with the current address. - Coordinate with pharmacy to resolve delivery issues.  General Health Maintenance Due for vaccinations: shingles, hepatitis B, and pneumonia. Discussed benefits of hepatitis B vaccination. - Administer second shingles vaccine. - Administer first hepatitis B vaccine. - Plan to administer pneumonia vaccine at the next visit.    Return in about 3 months (around 05/11/2024) for Chronic f/u, DM.      I discussed the assessment and treatment plan with the patient  The patient was provided an opportunity to ask questions and  all were answered. The patient agreed with the plan and demonstrated an understanding of the instructions.   The patient was advised to call back or seek an in-person evaluation if the symptoms worsen or if the condition fails to improve as anticipated.    LAURAINE LOISE BUOY, DO  [facility] [phone] (phone) [phone] (fax)  [facility]

## 2024-02-12 ENCOUNTER — Telehealth: Payer: Self-pay

## 2024-02-12 NOTE — Progress Notes (Signed)
   02/12/2024  Patient ID: Jason Livingston, male   DOB: Aug 10, 1966, 58 y.o.   MRN: [id number]  I received an update today regarding patient's blood pressure device; the order was processed and will be shipped. I called patient but was unable to reach. I left a voice message asking patient to return my call.   Dorcas Solian, PharmD Clinical Pharmacist Cell: [phone]

## 2024-02-13 ENCOUNTER — Telehealth: Payer: Self-pay

## 2024-02-13 ENCOUNTER — Encounter: Payer: Self-pay | Admitting: Family Medicine

## 2024-02-13 DIAGNOSIS — F330 Major depressive disorder, recurrent, mild: Secondary | ICD-10-CM | POA: Insufficient documentation

## 2024-02-13 NOTE — Telephone Encounter (Signed)
 Called pt no response, left message to inform Dorcas Solian. Will be in contact with pt regarding his smoking cessation program. As pt was interested to get back on the weekly check up on him.

## 2024-02-13 NOTE — Progress Notes (Signed)
   02/13/2024  Patient ID: Jason Livingston, male   DOB: Nov 23, 1965, 58 y.o.   MRN: [id number]  [name] was contacted again to notify him about the shipment of the device, as well as inquire about the weekly telephone outreaches he previously received. It is unclear which weekly patient outreaches he is referring to; a call was made today for clarification.   Will follow up with patient as scheduled.   Dorcas Solian, PharmD Clinical Pharmacist Cell: [phone]

## 2024-02-20 ENCOUNTER — Other Ambulatory Visit: Payer: Self-pay

## 2024-02-28 ENCOUNTER — Ambulatory Visit: Admitting: Dietician

## 2024-03-01 ENCOUNTER — Encounter: Payer: Self-pay | Admitting: Advanced Practice Midwife

## 2024-03-05 ENCOUNTER — Other Ambulatory Visit: Payer: Self-pay

## 2024-03-05 NOTE — Progress Notes (Signed)
 03/05/2024 Name: Jason Livingston MRN: [id number] DOB: 09-14-1965  Chief Complaint  Patient presents with   Medication Adherence   Medication Management    Jason Livingston is a 58 y.o. year old male who presented for a telephone visit.   They were referred to the pharmacist by their PCP for assistance in managing medication access and medication adherence.    Subjective:  Care Team: Primary Care Provider: Donzella Lauraine SAILOR, DO ; Next Scheduled Visit: 05/10/2024  Medication Access/Adherence  Current Pharmacy:  [facility] [address] [place] [address] Phone: [phone] Fax: [phone]  [id number], [place] - [address] AT [facility] [address] [place] [address] Phone: [phone] Fax: [phone]  [place] [facility] [address] [place] [address] Phone: [phone] Fax: [phone]   Patient reports affordability concerns with their medications: Yes  He has not setup payment plan with pharmacy due to lack consistent income and has not paid for medication yet. He has recently picked up medications from the pharmacy but could not get the medication mailed due to issues with address on file.   Patient reports access/transportation concerns to their pharmacy: Yes  Working on mail order . Still has not setup appointments for transportation. Reminded patient he spoke with Milicent on 11/15/2023, regarding Spoke with patient about Medicaid [place] transportation for rides to medical appointments and pharmacy. Patient stated he is currently using [employer] transit transportation.  Patient reports adherence concerns with their medications:  Yes  However, reports that he has gotten a lot better not missing medication but still struggles to take medication on time. He prefers to take before he drinks coffee but forgets to take until when  he is about to leave the house.      Diabetes:  Current medications: Metformin  XR 1000 mg daily,   Current glucose readings: FBG  90-135 (reports the last week consistently 120s) >>115-130 (last visit) -- Patient prefers Fasting blood glucose  >70-95 to possibly get off metformin  (long term goal) Using BGM meter; testing one time daily    Patient denies hypoglycemic s/sx since we last spoke with patient. Patient reports hyperglycemic symptoms including neuropathy.   Hypertension:  Current medications: losartan  100 mg daily, metoprolol  succinate 100 mg daily, metoprolol  tartrate 25 mg daily PRN SVT sxs   Patient does have a validated, automated, upper arm home BP cuff. Checks BP daily since he received the device two weeks ago.  Current blood pressure readings readings: Unsure  -- He has been writing down the numbers, but was not at home when I called for the appointment  Patient reports hypotensive s/sx including dizziness, lightheadedness.  Patient reports hypertensive symptoms including shortness of breath at rest and uses inhalers that he feels help SOB.    Hyperlipidemia/ASCVD Risk Reduction  Current lipid lowering medications: rosuvastatin  40 mg daily.      Objective:  Lab Results  Component Value Date   HGBA1C 6.3 (H) 11/10/2023    Lab Results  Component Value Date   CREATININE 0.76 08/31/2023   BUN 13 08/31/2023   NA 138 08/31/2023   K 4.1 08/31/2023   CL 107 08/31/2023   CO2 21 (L) 08/31/2023    Lab Results  Component Value Date   CHOL 143 10/05/2023   HDL 34 (L) 10/05/2023   LDLCALC 78 10/05/2023   LDLDIRECT 75 10/05/2023  TRIG 183 (H) 10/05/2023   CHOLHDL 4.2 10/05/2023    Medications Reviewed Today   Medications were not reviewed in this encounter       Assessment/Plan:   Diabetes: - Currently controlled - Reviewed long term cardiovascular and renal outcomes of uncontrolled blood sugar - Reviewed goal A1c, goal fasting, and goal  2 hour post prandial glucose - Recommend to continue current therapy  - Recommend to check glucose once daily (fasting) and PRN for sxs of low BG    Hypertension: - Currently controlled, but patient was unable to report home BP readings.  - Reviewed long term cardiovascular and renal outcomes of uncontrolled blood pressure and importance of compliance  - Reviewed appropriate blood pressure monitoring technique and reviewed goal blood pressure. Recommended to check home blood pressure and heart rate daily as you have been. Discussed using App on phone to track BP called  BP diary (self-monitoring). - Recommend to continue current regimen    Hyperlipidemia/ASCVD Risk Reduction: - Currently uncontrolled, LDL > 70.  - Reviewed long term complications of uncontrolled cholesterol and importance of compliance - Recommend to continue current regimen     Medication Management: - Currently strategy insufficient to maintain appropriate adherence to prescribed medication regimen - Suggested use of weekly pill box to organize medications as well as alarm - Helped patient refill medications.  - Encouraged patient to schedule transportation pick up and will provide link to setup next in-person appointment with provider.     Follow Up Plan: 4-6 weeks with PharmD Contact smoking cessation program Patient reports no one has contacted him Will follow up to ensure referral was received and outreach is made  Follow up with pharmacy regarding medication delivery Previously contacted pharmacy about delivery issue Confirm with pharmacy that the issue is resolved, as patient was unsure  Send MyChart message to patient Request home BP readings Provide information about using the App on smartphone  Medical transportation Send patient a link to available transportation resources/services: [email or web address]    Dorcas Solian, PharmD Clinical Pharmacist Cell: [phone]

## 2024-04-16 ENCOUNTER — Other Ambulatory Visit: Payer: Self-pay

## 2024-04-16 ENCOUNTER — Telehealth: Payer: Self-pay

## 2024-04-16 NOTE — Progress Notes (Signed)
   04/16/2024  Patient ID: Jason Livingston, male   DOB: October 31, 1965, 58 y.o.   MRN: [id number]  Attempted to contact patient for medication management/review. Left HIPAA compliant message for patient to return my call at their convenience.    Will follow up with patient to possibly reschedule an appointment, if interested in services, with embedded pharmacist. Reviewed medication adherence portal prior to outreach.     Dorcas Solian, PharmD Clinical Pharmacist Cell: [phone]

## 2024-04-16 NOTE — Progress Notes (Signed)
 Complex Care Management Care Guide Note  04/16/2024 Name: Jason Livingston MRN: [id number] DOB: 08/28/1965  Jason Livingston is a 58 y.o. year old male who is a primary care patient of Pardue, Lauraine SAILOR, DO and is actively engaged with the care management team. I reached out to Prentice Kansky by phone today to assist with re-scheduling  with the Pharmacist.  Follow up plan: Unsuccessful telephone outreach attempt made. A HIPAA compliant phone message was left for the patient providing contact information and requesting a return call.  Jeoffrey Buffalo , RMA     [facility]  [facility], [facility] Guide  Direct Dial: [phone]  Website: [email or web address]

## 2024-04-24 NOTE — Progress Notes (Signed)
 Complex Care Management Care Guide Note  04/24/2024 Name: Jason Livingston MRN: [id number] DOB: [birth year]  Jason Livingston is a 58 y.o. year old male who is a primary care patient of Pardue, Lauraine SAILOR, DO and is actively engaged with the care management team. I reached out to Prentice Kansky by phone today to assist with re-scheduling  with the Pharmacist.  Follow up plan: Unsuccessful telephone outreach attempt made. A HIPAA compliant phone message was left for the patient providing contact information and requesting a return call.  Jeoffrey Buffalo , RMA     [facility]  [facility], [facility] Guide  Direct Dial: [phone]  Website: [email or web address]

## 2024-05-08 ENCOUNTER — Other Ambulatory Visit: Payer: Self-pay | Admitting: Family Medicine

## 2024-05-08 ENCOUNTER — Other Ambulatory Visit: Payer: Self-pay

## 2024-05-08 DIAGNOSIS — Z8719 Personal history of other diseases of the digestive system: Secondary | ICD-10-CM

## 2024-05-08 DIAGNOSIS — R7303 Prediabetes: Secondary | ICD-10-CM

## 2024-05-08 DIAGNOSIS — I10 Essential (primary) hypertension: Secondary | ICD-10-CM

## 2024-05-08 MED ORDER — OMEPRAZOLE 20 MG PO CPDR
20.0000 mg | DELAYED_RELEASE_CAPSULE | Freq: Every day | ORAL | 1 refills | Status: AC
  Filled 2024-05-08: qty 90, 90d supply, fill #0
  Filled 2024-07-26: qty 30, 30d supply, fill #1
  Filled 2024-08-23: qty 30, 30d supply, fill #2
  Filled 2024-09-20: qty 30, 30d supply, fill #3

## 2024-05-08 MED ORDER — METFORMIN HCL ER 500 MG PO TB24
1000.0000 mg | ORAL_TABLET | Freq: Every day | ORAL | 1 refills | Status: AC
  Filled 2024-05-08: qty 180, 90d supply, fill #0
  Filled 2024-07-26: qty 60, 30d supply, fill #1
  Filled 2024-08-23: qty 60, 30d supply, fill #2
  Filled 2024-09-20: qty 60, 30d supply, fill #3

## 2024-05-08 MED ORDER — METOPROLOL SUCCINATE ER 100 MG PO TB24
100.0000 mg | ORAL_TABLET | Freq: Every day | ORAL | 1 refills | Status: AC
  Filled 2024-05-08: qty 90, 90d supply, fill #0
  Filled 2024-07-26: qty 30, 30d supply, fill #1
  Filled 2024-08-23: qty 30, 30d supply, fill #2
  Filled 2024-09-20: qty 30, 30d supply, fill #3

## 2024-05-08 MED ORDER — LOSARTAN POTASSIUM 100 MG PO TABS
100.0000 mg | ORAL_TABLET | Freq: Every day | ORAL | 1 refills | Status: AC
  Filled 2024-05-08: qty 90, 90d supply, fill #0
  Filled 2024-07-10: qty 90, 90d supply, fill #1
  Filled 2024-07-26: qty 30, 30d supply, fill #1
  Filled 2024-08-23: qty 30, 30d supply, fill #2
  Filled 2024-09-20: qty 30, 30d supply, fill #3

## 2024-05-10 ENCOUNTER — Encounter: Payer: Self-pay | Admitting: Family Medicine

## 2024-05-10 ENCOUNTER — Ambulatory Visit (INDEPENDENT_AMBULATORY_CARE_PROVIDER_SITE_OTHER): Admitting: Family Medicine

## 2024-05-10 ENCOUNTER — Ambulatory Visit
Admission: RE | Admit: 2024-05-10 | Discharge: 2024-05-10 | Disposition: A | Discharge status: Home / Self Care | Source: Ambulatory Visit | Attending: Acute Care | Admitting: Acute Care

## 2024-05-10 VITALS — BP 117/81 | HR 68 | Ht 73.0 in | Wt 226.5 lb

## 2024-05-10 DIAGNOSIS — Z122 Encounter for screening for malignant neoplasm of respiratory organs: Secondary | ICD-10-CM | POA: Diagnosis present

## 2024-05-10 DIAGNOSIS — Z716 Tobacco abuse counseling: Secondary | ICD-10-CM | POA: Diagnosis not present

## 2024-05-10 DIAGNOSIS — F17210 Nicotine dependence, cigarettes, uncomplicated: Secondary | ICD-10-CM | POA: Diagnosis present

## 2024-05-10 DIAGNOSIS — F17200 Nicotine dependence, unspecified, uncomplicated: Secondary | ICD-10-CM

## 2024-05-10 DIAGNOSIS — Z87891 Personal history of nicotine dependence: Secondary | ICD-10-CM | POA: Diagnosis present

## 2024-05-10 DIAGNOSIS — E782 Mixed hyperlipidemia: Secondary | ICD-10-CM

## 2024-05-10 DIAGNOSIS — Z5941 Food insecurity: Secondary | ICD-10-CM

## 2024-05-10 DIAGNOSIS — Z5986 Financial insecurity: Secondary | ICD-10-CM

## 2024-05-10 DIAGNOSIS — E538 Deficiency of other specified B group vitamins: Secondary | ICD-10-CM

## 2024-05-10 DIAGNOSIS — E1159 Type 2 diabetes mellitus with other circulatory complications: Secondary | ICD-10-CM | POA: Diagnosis not present

## 2024-05-10 DIAGNOSIS — Z5982 Transportation insecurity: Secondary | ICD-10-CM

## 2024-05-10 DIAGNOSIS — Z23 Encounter for immunization: Secondary | ICD-10-CM | POA: Diagnosis not present

## 2024-05-10 DIAGNOSIS — I152 Hypertension secondary to endocrine disorders: Secondary | ICD-10-CM | POA: Diagnosis not present

## 2024-05-10 DIAGNOSIS — E1169 Type 2 diabetes mellitus with other specified complication: Secondary | ICD-10-CM | POA: Diagnosis not present

## 2024-05-10 DIAGNOSIS — G479 Sleep disorder, unspecified: Secondary | ICD-10-CM

## 2024-05-10 NOTE — Progress Notes (Addendum)
 Established patient visit   Patient: Jason Livingston   DOB: 1966/02/22   58 y.o. Male  MRN: [id number] Visit Date: 05/10/2024  Today's healthcare provider: LAURAINE LOISE BUOY, DO   Chief Complaint  Patient presents with   Medical Management of Chronic Issues    T2DM, Hyperlipidemia follow-up  Patient declined flu vaccine today.   Subjective    HPI Jason Livingston is a 58 year old male with hypertension and diabetes who presents for a follow-up visit.  He has been monitoring his blood pressure and blood sugar levels, although he forgot to bring his logbook to the appointment. His blood sugar levels have been stable, ranging between 90 and 110 mg/dL. He has been experiencing some difficulty with his blood pressure cuff, as it requires frequent battery changes.  Increase time it takes to check both his blood sugar and his blood pressure (approximately 15 minutes) has led to less frequent monitoring.  He discusses his history of vascular surgery, noting that initially, he experienced improvement in his symptoms, such as better walking ability. However, after about a month, he began experiencing numbness in his foot and cramping in his calf again. He has not been able to reschedule a follow-up surgery due to transportation issues.  He is struggling with smoking cessation, stating that he has not been successful in quitting and plans to try again when he feels more disciplined. He expresses concern about the potential long-term health effects of smoking.  He mentions financial difficulties affecting his ability to purchase food, noting that he lost his food stamps after receiving disability benefits. He describes challenges in affording healthy food options and mentions that he has been consuming coffee with sugar and milk as his primary intake for the day.  He is currently taking trazodone  for sleep, which he reports is working well. He reports that chest pain and shortness of breath are not  as bad as they used to be.       Medications: Outpatient Medications Prior to Visit  Medication Sig   Accu-Chek Softclix Lancets lancets Use 1 each daily before breakfast.   acetaminophen  (TYLENOL ) 500 MG tablet Take 500-1,000 mg by mouth every 6 (six) hours as needed (pain.).   albuterol  (VENTOLIN  HFA) 108 (90 Base) MCG/ACT inhaler Inhale 2 puffs into the lungs every 6 (six) hours as needed for wheezing or shortness of breath.   aspirin  EC 81 MG tablet Take 81 mg by mouth daily. Swallow whole.   chlorhexidine  (PERIDEX ) 0.12 % solution Use as directed 15 mLs in the mouth or throat 2 (two) times daily. Start on last day of antibiotic and take through appointment.   clopidogrel  (PLAVIX ) 75 MG tablet Take 1 tablet (75 mg total) by mouth daily at 6 (six) AM.   cyanocobalamin  (VITAMIN B12) 1000 MCG tablet Take 1 tablet (1,000 mcg total) by mouth daily.   fluticasone  furoate-vilanterol (BREO ELLIPTA ) 200-25 MCG/ACT AEPB Inhale 1 puff into the lungs once daily.   Glucose Blood (BLOOD GLUCOSE TEST STRIPS) STRP Use daily before breakfast   losartan  (COZAAR ) 100 MG tablet Take 1 tablet (100 mg total) by mouth daily.   metFORMIN  (GLUCOPHAGE -XR) 500 MG 24 hr tablet Take 2 tablets (1,000 mg total) by mouth once daily with breakfast.   metoprolol  succinate (TOPROL  XL) 100 MG 24 hr tablet Take 1 tablet (100 mg total) by mouth once daily.   nicotine  (NICODERM CQ  - DOSED IN MG/24 HOURS) 21 mg/24hr patch Place 1 patch (21 mg total)  onto the skin daily.   omeprazole  (PRILOSEC) 20 MG capsule Take 1 capsule (20 mg total) by mouth once daily.   rosuvastatin  (CRESTOR ) 40 MG tablet Take 1 tablet (40 mg total) by mouth daily.   thiamine  (VITAMIN B-1) 100 MG tablet Take 1 tablet (100 mg total) by mouth once daily.   traZODone  (DESYREL ) 50 MG tablet Take 1 tablet (50 mg total) by mouth at bedtime as needed for sleep.   Accu-Chek Softclix Lancets lancets 1 each daily before breakfast. May substitute to any  manufacturer covered by patient's insurance.   Blood Glucose Monitoring Suppl (BLOOD GLUCOSE MONITOR SYSTEM) w/Device KIT Use as directed   Blood Pressure Monitoring (BLOOD PRESSURE KIT) KIT Use to check blood pressure daily as directed. Dx code I10.0   Lancet Device MISC 1 each by Does not apply route daily before breakfast. May substitute to any manufacturer covered by patient's insurance.   metoprolol  tartrate (LOPRESSOR ) 25 MG tablet Take 1 tablet (25 mg total) by mouth once daily as needed. (For elevated heart rates or SVT). (Patient not taking: Reported on 05/10/2024)   No facility-administered medications prior to visit.        Objective    BP 117/81 (BP Location: Left Arm, Patient Position: Sitting, Cuff Size: Normal)   Pulse 68   Ht 6' 1 (1.854 m)   Wt 226 lb 8 oz (102.7 kg)   SpO2 96%   BMI 29.88 kg/m     Physical Exam Vitals reviewed.  Constitutional:      General: He is not in acute distress.    Appearance: Normal appearance. He is not diaphoretic.  HENT:     Head: Normocephalic and atraumatic.  Eyes:     General: No scleral icterus.    Conjunctiva/sclera: Conjunctivae normal.  Cardiovascular:     Rate and Rhythm: Normal rate and regular rhythm.     Pulses: Normal pulses.     Heart sounds: Normal heart sounds. No murmur heard. Pulmonary:     Effort: Pulmonary effort is normal. No respiratory distress.     Breath sounds: Normal breath sounds. No wheezing or rhonchi.  Musculoskeletal:     Cervical back: Neck supple.     Right lower leg: No edema.     Left lower leg: No edema.  Lymphadenopathy:     Cervical: No cervical adenopathy.  Skin:    General: Skin is warm and dry.     Findings: No rash.  Neurological:     Mental Status: He is alert and oriented to person, place, and time. Mental status is at baseline.  Psychiatric:        Mood and Affect: Mood normal.        Behavior: Behavior normal.      No results found for any visits on 05/10/24.   Assessment & Plan    Hypertension associated with diabetes (HCC)  Type 2 diabetes mellitus with other specified complication, without long-term current use of insulin  (HCC) -     Microalbumin / creatinine urine ratio -     Comprehensive metabolic panel with GFR -     Hemoglobin A1c  Nicotine  dependence with current use  Encounter for smoking cessation counseling  Sleep disturbance  Financial insecurity  Transportation insecurity limiting access to food  Mixed hyperlipidemia -     Lipid panel  Vitamin B12 deficiency -     Vitamin B12  Hepatitis B vaccination administered at current visit -     Heplisav-B  (HepB-CPG) Vaccine  Hypertension associated with diabetes Chronic, stable.  Blood pressure is 117/81 mmHg, with diastolic pressure slightly above target but overall control satisfactory.  No changes.  Peripheral artery disease with recurrent leg symptoms Recurrent leg symptoms post-arterial surgery likely due to hypergrowth and scarring causing re-narrowing of the artery. - Follow-up with vascular surgery as planned; defer to specialist management.  Nicotine  dependence with current use; smoking cessation counseling Continues to struggle with smoking cessation and acknowledged need for more discipline and motivation. Concerned about long-term health impacts, including cancer risk.  Type 2 diabetes mellitus with other specified complication, without long-term current use of insulin  Blood sugars reported as well-controlled, ranging between 90 and 110 mg/dL.  Recheck A1c today.  No changes today.  Sleep disturbance Trazodone  is effective for sleep. - Continue trazodone  for sleep.  No changes today.  Financial insecurity; transportation insecurity limiting access to food Financial difficulties affect access to healthy food. Previous referral to social worker unsuccessful due to change in phone number. Rising food costs and loss of food stamps impact ability to purchase  healthy food. - Update phone number in records. - Re-refer to Child psychotherapist for food assistance.  Mixed hyperlipidemia Chronic, improved at previous check.  Continue rosuvastatin  40 mg daily.  No changes.  Recheck lipid panel today.  General Health Maintenance Discussed vaccinations including hepatitis B, pneumonia, and flu. Hepatitis B series ongoing, with second dose to be administered today.  Pneumonia vaccine planned for subsequent visit. Declined flu and COVID.     Return in about 3 months (around 08/09/2024) for Chronic f/u.      I discussed the assessment and treatment plan with the patient  The patient was provided an opportunity to ask questions and all were answered. The patient agreed with the plan and demonstrated an understanding of the instructions.   The patient was advised to call back or seek an in-person evaluation if the symptoms worsen or if the condition fails to improve as anticipated.    LAURAINE LOISE BUOY, DO  [facility] [phone] (phone) [phone] (fax)  [facility]

## 2024-05-10 NOTE — Patient Instructions (Addendum)
 Recommended vaccines: Prevnar-20 or -21 to protect against severe pneumonia.  Please check for records for tetanus (Td and/or Tdap).    Jason Livingston , RMA     [facility]  [facility], [facility] Guide  Direct Dial: [phone]    Dorcas Solian, PharmD Clinical Pharmacist Cell: [phone]

## 2024-05-11 LAB — COMPREHENSIVE METABOLIC PANEL WITH GFR
ALT: 20 IU/L (ref 0–44)
AST: 18 IU/L (ref 0–40)
Albumin: 4.4 g/dL (ref 3.8–4.9)
Alkaline Phosphatase: 66 IU/L (ref 47–123)
BUN/Creatinine Ratio: 15 (ref 9–20)
BUN: 16 mg/dL (ref 6–24)
Bilirubin Total: 0.5 mg/dL (ref 0.0–1.2)
CO2: 21 mmol/L (ref 20–29)
Calcium: 9.6 mg/dL (ref 8.7–10.2)
Chloride: 102 mmol/L (ref 96–106)
Creatinine, Ser: 1.04 mg/dL (ref 0.76–1.27)
Globulin, Total: 2.7 g/dL (ref 1.5–4.5)
Glucose: 87 mg/dL (ref 70–99)
Potassium: 4.9 mmol/L (ref 3.5–5.2)
Sodium: 140 mmol/L (ref 134–144)
Total Protein: 7.1 g/dL (ref 6.0–8.5)
eGFR: 84 mL/min/1.73 (ref >59)

## 2024-05-11 LAB — HEMOGLOBIN A1C
Est. average glucose Bld gHb Est-mCnc: 134 mg/dL
Hgb A1c MFr Bld: 6.3 % — ABNORMAL HIGH (ref 4.8–5.6)

## 2024-05-11 LAB — MICROALBUMIN / CREATININE URINE RATIO
Creatinine, Urine: 174.5 mg/dL
Microalb/Creat Ratio: 4 mg/g{creat} (ref 0–29)
Microalbumin, Urine: 6.9 ug/mL

## 2024-05-11 LAB — VITAMIN B12: Vitamin B-12: 731 pg/mL (ref 232–1245)

## 2024-05-11 LAB — LIPID PANEL
Chol/HDL Ratio: 4.9 ratio (ref 0.0–5.0)
Cholesterol, Total: 128 mg/dL (ref 100–199)
HDL: 26 mg/dL — ABNORMAL LOW (ref >39)
LDL Chol Calc (NIH): 63 mg/dL (ref 0–99)
Triglycerides: 236 mg/dL — ABNORMAL HIGH (ref 0–149)
VLDL Cholesterol Cal: 39 mg/dL (ref 5–40)

## 2024-05-16 ENCOUNTER — Ambulatory Visit: Attending: Medical | Admitting: Medical

## 2024-05-16 NOTE — Progress Notes (Deleted)
  Cardiology Office Note   Date:  05/16/2024  ID:  Jason Livingston, DOB [birth year], MRN [id number] PCP: Donzella Lauraine SAILOR, DO  [facility] Providers Cardiologist:  Lonni Hanson, MD { Click to update primary MD,subspecialty MD or APP then REFRESH:1}    History of Present Illness Jason Livingston is a 58 y.o. male with a hx of pSVT, HTN, HLD, prediabetes, tobacco use, alcohol abuse, PAD s/p bilateral endarterectomies with stenting to the external iliac arteries bilaterally 08/2023 who presents for follow-up.    He was seen in the ED September 2022 for palpitations due to SVT, treated with adenosine  with minimal troponin elevation. He was started on metoprolol  25mg  daily. Echo was ordered which showed LVEF G1DD, mild MR, and mild MAE.    He was seen 06/16/21 and noted some improvement in symptoms of his palpitations. Metoprolol  was increased to 50mg  daily. He also reported b/l claudication and ABIs, US  aorta/IVC/iliac, lower extremity US  were ordered, but not performed. Myoview stress test was ordered for chest pain and SOB, but this was not completed due to inability to not smoke for 24 hours.     The patient was last seen 09/2023 and was on plavix  and gabapentin  per [facility]. He denied claudication symptoms.  ROS: ***  Studies Reviewed      *** Risk Assessment/Calculations {Does this patient have ATRIAL FIBRILLATION?:[phone]} No BP recorded.  {Refresh Note OR Click here to enter BP  :1}***       Physical Exam VS:  There were no vitals taken for this visit.       Wt Readings from Last 3 Encounters:  05/10/24 226 lb 8 oz (102.7 kg)  02/09/24 225 lb 6.4 oz (102.2 kg)  11/23/23 227 lb (103 kg)    GEN: Well nourished, well developed in no acute distress NECK: No JVD; No carotid bruits CARDIAC: ***RRR, no murmurs, rubs, gallops RESPIRATORY:  Clear to auscultation without rales, wheezing or rhonchi  ABDOMEN: Soft, non-tender, non-distended EXTREMITIES:  No edema; No deformity    ASSESSMENT AND PLAN ***    {Are you ordering a CV Procedure (e.g. stress test, cath, DCCV, TEE, etc)?   Press F2        :[id number]}  Dispo: ***  Signed, Marqui Formby VEAR Fishman, PA-C

## 2024-05-20 ENCOUNTER — Ambulatory Visit: Attending: Medical | Admitting: Medical

## 2024-05-20 ENCOUNTER — Encounter: Payer: Self-pay | Admitting: Medical

## 2024-05-20 ENCOUNTER — Ambulatory Visit: Payer: Self-pay | Admitting: Family Medicine

## 2024-05-20 VITALS — BP 109/60 | HR 68 | Ht 73.0 in | Wt 224.8 lb

## 2024-05-20 DIAGNOSIS — Z720 Tobacco use: Secondary | ICD-10-CM | POA: Diagnosis present

## 2024-05-20 DIAGNOSIS — I471 Supraventricular tachycardia, unspecified: Secondary | ICD-10-CM | POA: Diagnosis not present

## 2024-05-20 DIAGNOSIS — I70229 Atherosclerosis of native arteries of extremities with rest pain, unspecified extremity: Secondary | ICD-10-CM | POA: Diagnosis not present

## 2024-05-20 DIAGNOSIS — E782 Mixed hyperlipidemia: Secondary | ICD-10-CM | POA: Diagnosis not present

## 2024-05-20 DIAGNOSIS — I739 Peripheral vascular disease, unspecified: Secondary | ICD-10-CM | POA: Insufficient documentation

## 2024-05-20 DIAGNOSIS — I2510 Atherosclerotic heart disease of native coronary artery without angina pectoris: Secondary | ICD-10-CM | POA: Insufficient documentation

## 2024-05-20 DIAGNOSIS — Z87898 Personal history of other specified conditions: Secondary | ICD-10-CM | POA: Diagnosis present

## 2024-05-20 MED ORDER — ASPIRIN 81 MG PO TBEC: 81.0000 mg | DELAYED_RELEASE_TABLET | Freq: Every day | ORAL | 3 refills | Status: DC

## 2024-05-20 NOTE — Patient Instructions (Signed)
 Medication Instructions:   Your physician recommends that you continue on your current medications as directed. Please refer to the Current Medication list given to you today.   *If you need a refill on your cardiac medications before your next appointment, please call your pharmacy*  Lab Work:  No labs ordered today   If you have labs (blood work) drawn today and your tests are completely normal, you will receive your results only by: MyChart Message (if you have MyChart) OR A paper copy in the mail If you have any lab test that is abnormal or we need to change your treatment, we will call you to review the results.  Testing/Procedures:  No test ordered today   Follow-Up: At [facility], you and your health needs are our priority.  As part of our continuing mission to provide you with exceptional heart care, our providers are all part of one team.  This team includes your primary Cardiologist (physician) and Advanced Practice Providers or APPs (Physician Assistants and Nurse Practitioners) who all work together to provide you with the care you need, when you need it.  Your next appointment:    6 month(s)  Provider:    You may see Lonni Hanson, MD or one of the following Advanced Practice Providers on your designated Care Team:   Lonni Meager, NP Lesley Maffucci, PA-C Bernardino Bring, PA-C Cadence [place], PA-C Tylene Lunch, NP Barnie Hila, NP

## 2024-05-20 NOTE — Progress Notes (Unsigned)
 Cardiology Office Note   Date:  05/20/2024  ID:  Dvid Pendry, DOB 08-11-1966, MRN [id number] PCP: Donzella Lauraine SAILOR, DO  [facility] Providers Cardiologist:  Lonni Hanson, MD   History of Present Illness Jason Livingston is a 58 y.o. 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    He was seen in the ED September 2022 for palpitations due to SVT, treated with adenosine  with minimal troponin elevation. He was started on metoprolol  25mg  daily. Echo was ordered which showed LVEF G1DD, mild MR, and mild MAE.    He was seen 06/16/21 and noted some improvement in symptoms of his palpitations. Metoprolol  was increased to 50mg  daily. He also reported b/l claudication and ABIs, US  aorta/IVC/iliac, lower extremity US  were ordered, but not performed. Myoview stress test was ordered for chest pain and SOB, but this was not completed due to inability to not smoke for 24 hours.    The patient was last seen 09/2023 and was on plavix  and gabapentin  per [facility]. He denied claudication symptoms.  Today, the patient is overall doing well. He reports rare chest pain and SOB. He still has chronic left leg claudication. Patient is not having pain at rest. He has occasional cramping in his calf when he walks. He reported he was supposed to have another vascular procedure, but this was canceled (likely due to no-show). He is still smoking 1ppd. He denies palpitations. He is not drinking alcohol.  Studies Reviewed EKG Interpretation Date/Time:  Monday May 20 2024 15:16:33 EDT Ventricular Rate:  68 PR Interval:  160 QRS Duration:  108 QT Interval:  394 QTC Calculation: 418 R Axis:   38  Text Interpretation: Normal sinus rhythm Incomplete right bundle branch block Nonspecific T wave abnormality When compared with  ECG of 05-Oct-2023 15:15, Nonspecific T wave abnormality now evident in Lateral leads Confirmed by Franchester, Belton Peplinski ([id number]) on 05/20/2024 3:31:52 PM    Echo 07/2023  1. Left ventricular ejection fraction, by estimation, is 60 to 65%. The  left ventricle has normal function. The left ventricle has no regional  wall motion abnormalities. Left ventricular diastolic parameters were  normal. The average left ventricular  global longitudinal strain is -15.9 %.   2. Right ventricular systolic function is normal. The right ventricular  size is normal. Tricuspid regurgitation signal is inadequate for assessing  PA pressure.   3. The mitral valve is normal in structure. Mild mitral valve  regurgitation. No evidence of mitral stenosis.   4. The aortic valve has an indeterminant number of cusps. Aortic valve  regurgitation is not visualized. No aortic stenosis is present.   5. The inferior vena cava is normal in size with greater than 50%  respiratory variability, suggesting right atrial pressure of 3 mmHg.    Cardiac CT 01/2022 IMPRESSION: 1. Coronary calcium  score of 159. This was 87th percentile for age and sex matched control.   2. Normal coronary origin with right dominance.   3. Mild proximal LAD stenosis (25%).   4. Minimal proximal RCA stenosis (<25%).   5. CAD-RADS 2. Mild non-obstructive CAD (25-49%). Consider non-atherosclerotic causes of chest pain. Consider preventive therapy and risk factor modification.   Electronically Signed: By: Redell Cave M.D. On: 01/13/2022 15:48     Physical Exam VS:  BP 109/60 (BP Location: Left Arm, Patient Position: Sitting,  Cuff Size: Normal)   Pulse 68   Ht 6' 1 (1.854 m)   Wt 224 lb 12.8 oz (102 kg)   SpO2 94%   BMI 29.66 kg/m        Wt Readings from Last 3 Encounters:  05/20/24 224 lb 12.8 oz (102 kg)  05/10/24 226 lb 8 oz (102.7 kg)  02/09/24 225 lb 6.4 oz (102.2 kg)    GEN: Well nourished, well developed in no acute  distress NECK: No JVD; No carotid bruits CARDIAC: RRR, no murmurs, rubs, gallops RESPIRATORY:  Clear to auscultation without rales, wheezing or rhonchi  ABDOMEN: Soft, non-tender, non-distended EXTREMITIES:  No edema; No deformity   ASSESSMENT AND PLAN  pSVT The patient denies palpitations. Continue Toprol  100mg  daily and lopressor  PRN palpitations.  Nonobstructive CAD Cardiac CTA in 2023 showed mild nonobstructive CAD. He reports rare chest pain and SOB. Feels symptoms do not impact quality of life. No further ischemic work-up at this time. Continue ASA 81mg  daily, Plavix  75mg  daily, and Crestor  40mg  daily.   PAD s/p B/L femoral endarterectomies He reports progressive pain on the left.  Plan was for repeat lower leg angiography, but the patient ?no showed. I recommended he follow-up with vascular surgery. Continue ASA, Plavix  and Crestor .   HLD LDL 63. Continue Crestor  40mg  daily.  Tobacco use He is still smoking 1ppd. He is wanting to quit.   Alcohol use He reports continued cessation.       Dispo: Follow-up in 6 months  Signed, Saida Lonon VEAR Fishman, PA-C

## 2024-05-22 ENCOUNTER — Telehealth: Payer: Self-pay | Admitting: Acute Care

## 2024-05-22 DIAGNOSIS — R918 Other nonspecific abnormal finding of lung field: Secondary | ICD-10-CM

## 2024-05-22 NOTE — Telephone Encounter (Signed)
 Call report received:  IMPRESSION: 1. Lung-RADS 4A, suspicious. Follow up low-dose chest CT without contrast in 3 months (please use the following order, CT CHEST LCS NODULE FOLLOW-UP W/O CM) is recommended. Alternatively, PET may be considered when there is a solid component 8mm or larger. New pulmonary nodules, largest measuring 7.5 mm in the left upper lobe. 2. Coronary artery calcifications. 3. Aortic Atherosclerosis (ICD10-I70.0) and Emphysema (ICD10-J43.9).   These results will be called to the ordering clinician or representative by the Radiologist Assistant, and communication documented in the PACS or [employer].     Electronically Signed   By: Andrea Gasman M.D.   On: 05/21/2024 16:38

## 2024-05-23 NOTE — Addendum Note (Signed)
 Addended by: DIONISIO AQUAS on: 05/23/2024 10:05 AM   Modules accepted: Orders

## 2024-05-23 NOTE — Telephone Encounter (Signed)
 Results reviewed by Dr. Tamea as follows:  Yes 17-month follow-up is best course of action presently.   Contacted patient by phone to review results.  He states this makes him nervous as he has a friend who had a scan a year ago and was told all was fine.  Then the next year he had 'stage 4 lung cancer.'  He feels uneasy with waiting 3 months.  Discussed that his results were reviewed by a pulmonary specialty provider and recommendation for 3 months repeat scan is considered protocol. Discussed due to size of nodule an biopsy or PET scan would not be optimal with nodules less than 8mm in size.  Offered a pulmonary consult if patient would prefer in-office evaluation and discussion.  Patient declined at this time but advised he could think about it while we waited the 3 month interval for follow up scan. Agrees to order the 3 month repeat LDCT and he would call back if he decides he wants consult.  Also discussed he may want to reach out to PCP for discussion, if he thought that may be of benefit in his decision or to help with his concerns.  Patient denies any recent illness or chest congestion, etc.  No symptoms of any changes in his lung health or recent symptoms of illness.  Patient is agreeable to order the 3 months follow up and if he decides he would like a consult, he can call us  back and we can assist connecting him to [place] or [place] office for appt.  Patient acknowledged understanding.  Order placed for follow up LDCT and PCP faxed results and plan.

## 2024-06-06 ENCOUNTER — Ambulatory Visit (INDEPENDENT_AMBULATORY_CARE_PROVIDER_SITE_OTHER)

## 2024-06-06 ENCOUNTER — Other Ambulatory Visit: Payer: Self-pay

## 2024-06-06 DIAGNOSIS — F17200 Nicotine dependence, unspecified, uncomplicated: Secondary | ICD-10-CM | POA: Diagnosis not present

## 2024-06-06 DIAGNOSIS — J4540 Moderate persistent asthma, uncomplicated: Secondary | ICD-10-CM | POA: Diagnosis not present

## 2024-06-06 DIAGNOSIS — Z716 Tobacco abuse counseling: Secondary | ICD-10-CM

## 2024-06-06 MED ORDER — ALBUTEROL SULFATE HFA 108 (90 BASE) MCG/ACT IN AERS
2.0000 | INHALATION_SPRAY | Freq: Four times a day (QID) | RESPIRATORY_TRACT | 0 refills | Status: AC | PRN
  Filled 2024-06-06: qty 18, 25d supply, fill #0

## 2024-06-06 MED ORDER — VARENICLINE TARTRATE (STARTER) 0.5 MG X 11 & 1 MG X 42 PO TBPK
ORAL_TABLET | ORAL | 0 refills | Status: DC
  Filled 2024-06-06 – 2024-07-10 (×2): qty 53, 30d supply, fill #0

## 2024-06-06 MED ORDER — NICOTINE 21 MG/24HR TD PT24
21.0000 mg | MEDICATED_PATCH | Freq: Every day | TRANSDERMAL | 0 refills | Status: AC
Start: 2024-06-06 — End: ?
  Filled 2024-06-06 – 2024-07-10 (×2): qty 28, 28d supply, fill #0

## 2024-06-06 MED ORDER — VARENICLINE TARTRATE 1 MG PO TABS
1.0000 mg | ORAL_TABLET | Freq: Two times a day (BID) | ORAL | 1 refills | Status: DC
  Filled 2024-06-06 – 2024-07-10 (×2): qty 60, 30d supply, fill #0

## 2024-06-06 NOTE — Progress Notes (Signed)
 S:     Chief Complaint  Patient presents with   Medication Management    Reason for visit: ?  Jason Livingston is a 58 y.o. male with a history of diabetes (type 2), who presents today for an initial  Face to Face pharmacotherapy visit.? Pertinent PMH also includes asthma, T2DM, anxiety, depression, GERD, HLD, HTN, PAD, SVT, T2DM.  Care Team: Primary Care Provider: Donzella Lauraine SAILOR, DO  At last visit with cardiology on 05/20/24, he denied palpitations. EKG at that time showed NSR, incomplete right BBB, nonspecific Twave abnormality compared to previous.  Patient reports symptoms of palpitations and SOB that started yesterday evening and lasted for an hour in duration. He reports taking a dose of his Lopressor  25 mg when the symptoms started. Previous palpitations reported >1 year ago per patient. Denied current symptoms. Denied current or previous GI upset, chest pain, jaw pain. Infrequent HA reported. Reported HR of 110 and BP of 170/90 mmHg at that time. No other reported home BP readings.   Current diabetes medications include: metformin  XR 500 mg 2 tablets daily Current hypertension medications include: losartan  100 mg daily, Toprol  XL 100 mg daily, Lopressor  25 mg prn palpitations Current hyperlipidemia medications include: rosuvastatin  40 mg daily   Patient reports difficulty with adherence, namely missing medications a few times per week.   Have you been experiencing any side effects to the medications prescribed? no Do you have any problems obtaining medications due to transportation or finances? yes Insurance coverage: [place] Medicaid  Tobacco Abuse:  Tobacco Use History: Age when started using tobacco on a daily basis: 19 Number of cigarettes per day: at least 1.5 ppd (rolls his own) Smokes first cigarette: 30-40 minutes after waking Does not wake at night to smoke Triggers include: boredom   Quit Attempt History: Most recent quit attempt about 20 years ago Longest time  ever been tobacco free 2.5 years Methods tried in the past include Nicotine  Patch, Nicotine  lozenges Motivators to quitting include concern for lung cancer on most recent CT scan  DM Prevention:  Statin: Taking; high intensity.?  History of albuminuria? no, last UACR on 05/10/24 = 4 mg/g Last eye exam:  Lab Results  Component Value Date   HMDIABEYEEXA No Retinopathy 06/29/2023   Tobacco Use:  Tobacco Use: High Risk (05/20/2024)   Patient History    Smoking Tobacco Use: Every Day    Smokeless Tobacco Use: Former    Passive Exposure: Not on file   O:   Vitals:  Wt Readings from Last 3 Encounters:  05/20/24 224 lb 12.8 oz (102 kg)  05/10/24 226 lb 8 oz (102.7 kg)  02/09/24 225 lb 6.4 oz (102.2 kg)   BP Readings from Last 3 Encounters:  06/06/24 111/72  05/20/24 109/60  05/10/24 117/81   Pulse Readings from Last 3 Encounters:  06/06/24 64  05/20/24 68  05/10/24 68     Labs:?  Lab Results  Component Value Date   HGBA1C 6.3 (H) 05/10/2024   HGBA1C 6.3 (H) 11/10/2023   HGBA1C 6.5 (H) 05/10/2023   GLUCOSE 87 05/10/2024   MICRALBCREAT 4 05/10/2024   MICRALBCREAT <6 06/05/2023   CREATININE 1.04 05/10/2024   CREATININE 0.76 08/31/2023   CREATININE 0.77 08/30/2023    Lab Results  Component Value Date   CHOL 128 05/10/2024   LDLCALC 63 05/10/2024   LDLCALC 78 10/05/2023   LDLCALC 123 (H) 05/10/2023   LDLDIRECT 75 10/05/2023   HDL 26 (L) 05/10/2024   TRIG 236 (  H) 05/10/2024   TRIG 183 (H) 10/05/2023   TRIG 280 (H) 05/10/2023   ALT 20 05/10/2024   ALT 15 05/10/2023   AST 18 05/10/2024   AST 17 05/10/2023      Chemistry      Component Value Date/Time   NA 140 05/10/2024 1516   K 4.9 05/10/2024 1516   CL 102 05/10/2024 1516   CO2 21 05/10/2024 1516   BUN 16 05/10/2024 1516   CREATININE 1.04 05/10/2024 1516      Component Value Date/Time   CALCIUM  9.6 05/10/2024 1516   ALKPHOS 66 05/10/2024 1516   AST 18 05/10/2024 1516   ALT 20 05/10/2024 1516    BILITOT 0.5 05/10/2024 1516       The ASCVD Risk score (Arnett DK, et al., 2019) failed to calculate for the following reasons:   The valid total cholesterol range is 130 to 320 mg/dL  Lab Results  Component Value Date   MICRALBCREAT 4 05/10/2024   MICRALBCREAT <6 06/05/2023    A/P: SVT/Palpitations: -Patient denies any current symptoms. Palpitation and SOB symptoms yesterday lasted about 1 hour in duration. Denied CP, GI upset, jaw pain, HA.  -Counseled for patient to present to urgent care if symptoms return. Patient vocalized understanding.  -No same day appointments available in the clinic today. Encouraged sooner follow up within a week with PCP -Encouraged home BP monitoring   Medication Management: - Currently strategy insufficient to maintain appropriate adherence to prescribed medication regimen - Suggested use of phone alarms and to take first thing in the morning to assist with adherence  Tobacco Abuse - Currently uncontrolled - Provided motivational interviewing to assess tobacco use and strategies for reduction - Restart nicotine  patch 21 mg daily. Counseled on proper placement and potential side effects, including mild itching/redness at the location site, headache, trouble sleeping and/or vivid dreams. Advised to remove patch at night if development of trouble sleeping.  - start nicotine  lozenge 4 mg as needed. Counseled on proper use and potential side effects, including nausea, hiccups, cough, and heartburn.  - Start varenicline 0.5 mg daily for 3 days, then 0.5 mg twice daily for 4 days, then 1 mg twice daily thereafter. Counseled tot ake with food to reduce risk of GI adverse effects. Also counseled to stop therapy and contact provider if change in mood.     Patient verbalized understanding of treatment plan. Total time patient counseling 45 minutes.  Follow-up:  Pharmacist on 07/10/24 PCP clinic visit on 06/18/24  Jason Livingston, PharmD Clinical  Pharmacist [facility] [phone]

## 2024-06-07 ENCOUNTER — Other Ambulatory Visit: Payer: Self-pay

## 2024-06-18 ENCOUNTER — Ambulatory Visit: Admitting: Family Medicine

## 2024-06-18 ENCOUNTER — Other Ambulatory Visit: Payer: Self-pay

## 2024-07-10 ENCOUNTER — Other Ambulatory Visit: Payer: Self-pay

## 2024-07-10 ENCOUNTER — Ambulatory Visit (INDEPENDENT_AMBULATORY_CARE_PROVIDER_SITE_OTHER)

## 2024-07-10 ENCOUNTER — Other Ambulatory Visit (HOSPITAL_COMMUNITY): Payer: Self-pay

## 2024-07-10 VITALS — BP 135/81 | HR 65

## 2024-07-10 DIAGNOSIS — Z716 Tobacco abuse counseling: Secondary | ICD-10-CM

## 2024-07-10 DIAGNOSIS — E782 Mixed hyperlipidemia: Secondary | ICD-10-CM | POA: Diagnosis not present

## 2024-07-10 MED ORDER — NICOTINE 7 MG/24HR TD PT24
MEDICATED_PATCH | TRANSDERMAL | 0 refills | Status: AC
  Filled 2024-07-10: qty 14, fill #0

## 2024-07-10 MED ORDER — NICOTINE 14 MG/24HR TD PT24
MEDICATED_PATCH | TRANSDERMAL | 0 refills | Status: AC
  Filled 2024-07-10: qty 14, 14d supply, fill #0

## 2024-07-10 MED ORDER — ASPIRIN 81 MG PO TBEC
81.0000 mg | DELAYED_RELEASE_TABLET | Freq: Every day | ORAL | 3 refills | Status: AC
  Filled 2024-07-10 – 2024-09-20 (×2): qty 90, 90d supply, fill #0

## 2024-07-10 MED ORDER — ROSUVASTATIN CALCIUM 40 MG PO TABS
40.0000 mg | ORAL_TABLET | Freq: Every day | ORAL | 3 refills | Status: AC
  Filled 2024-07-10: qty 90, 90d supply, fill #0
  Filled 2024-07-26: qty 30, 30d supply, fill #0
  Filled 2024-08-23: qty 30, 30d supply, fill #1
  Filled 2024-09-20: qty 30, 30d supply, fill #2

## 2024-07-10 NOTE — Progress Notes (Deleted)
 S:     Reason for visit: ?  Jason Livingston is a 58 y.o. male with a history of diabetes (type 2), who presents today for a follow up  Face to Face pharmacotherapy visit.? Pertinent PMH also includes asthma, T2DM, anxiety, depression, GERD, HLD, HTN, PAD, SVT, T2DM.  Care Team: Primary Care Provider: Donzella Lauraine SAILOR, DO  At last visit with cardiology on 05/20/24, he denied palpitations. EKG at that time showed NSR, incomplete right BBB, nonspecific Twave abnormality compared to previous.  At last visit with clinical pharmacist on 06/06/24, patient reported symptoms of palpitations and SOB that started yesterday evening and lasted for an hour in duration and resolved when he took a dose of his Lopressor  25 mg. Previous palpitations reported >1 year ago per patient. Denied symptoms at that time. Patient was started on Chantix  for smoking cessation and instructed to follow up with PCP, however, patient canceled appointment with his PCP.  Today, he denies any additional SVT symptoms. He had no yet been able to pick up his smoking cessation medications.   Current diabetes medications include: metformin  XR 500 mg 2 tablets daily Current hypertension medications include: losartan  100 mg daily, Toprol  XL 100 mg daily, Lopressor  25 mg prn palpitations Current hyperlipidemia medications include: rosuvastatin  40 mg daily   Patient reports difficulty with adherence, namely missing medications a few times per week.   Have you been experiencing any side effects to the medications prescribed? no Do you have any problems obtaining medications due to transportation or finances? yes Insurance coverage: [place] Medicaid  Tobacco Abuse:  Tobacco Use History: Age when started using tobacco on a daily basis: 19 Number of cigarettes per day: at least 1.5 ppd (rolls his own) Smokes first cigarette: 30-40 minutes after waking Does not wake at night to smoke Triggers include: boredom   Quit Attempt  History: Most recent quit attempt about 20 years ago Longest time ever been tobacco free 2.5 years Methods tried in the past include Nicotine  Patch, Nicotine  lozenges Motivators to quitting include concern for lung cancer on most recent CT scan  DM Prevention:  Statin: Taking; high intensity.?  History of albuminuria? no Last eye exam:  Lab Results  Component Value Date   HMDIABEYEEXA No Retinopathy 06/29/2023   Tobacco Use:  Tobacco Use: High Risk (05/20/2024)   Patient History    Smoking Tobacco Use: Every Day    Smokeless Tobacco Use: Former    Passive Exposure: Not on file   O:   Vitals:  Wt Readings from Last 3 Encounters:  05/20/24 224 lb 12.8 oz (102 kg)  05/10/24 226 lb 8 oz (102.7 kg)  02/09/24 225 lb 6.4 oz (102.2 kg)   BP Readings from Last 3 Encounters:  06/06/24 111/72  05/20/24 109/60  05/10/24 117/81   Pulse Readings from Last 3 Encounters:  06/06/24 64  05/20/24 68  05/10/24 68     Labs:?  Lab Results  Component Value Date   HGBA1C 6.3 (H) 05/10/2024   HGBA1C 6.3 (H) 11/10/2023   HGBA1C 6.5 (H) 05/10/2023   GLUCOSE 87 05/10/2024   MICRALBCREAT 4 05/10/2024   MICRALBCREAT <6 06/05/2023   CREATININE 1.04 05/10/2024   CREATININE 0.76 08/31/2023   CREATININE 0.77 08/30/2023    Lab Results  Component Value Date   CHOL 128 05/10/2024   LDLCALC 63 05/10/2024   LDLCALC 78 10/05/2023   LDLCALC 123 (H) 05/10/2023   LDLDIRECT 75 10/05/2023   HDL 26 (L) 05/10/2024   TRIG  236 (H) 05/10/2024   TRIG 183 (H) 10/05/2023   TRIG 280 (H) 05/10/2023   ALT 20 05/10/2024   ALT 15 05/10/2023   AST 18 05/10/2024   AST 17 05/10/2023      Chemistry      Component Value Date/Time   NA 140 05/10/2024 1516   K 4.9 05/10/2024 1516   CL 102 05/10/2024 1516   CO2 21 05/10/2024 1516   BUN 16 05/10/2024 1516   CREATININE 1.04 05/10/2024 1516      Component Value Date/Time   CALCIUM  9.6 05/10/2024 1516   ALKPHOS 66 05/10/2024 1516   AST 18 05/10/2024  1516   ALT 20 05/10/2024 1516   BILITOT 0.5 05/10/2024 1516       The ASCVD Risk score (Arnett DK, et al., 2019) failed to calculate for the following reasons:   The valid total cholesterol range is 130 to 320 mg/dL  Lab Results  Component Value Date   MICRALBCREAT 4 05/10/2024   MICRALBCREAT <6 06/05/2023    A/P: SVT/Palpitations: -Patient denies any current symptoms. Palpitation and SOB symptoms yesterday lasted about 1 hour in duration. Denied CP, GI upset, jaw pain, HA.  -Counseled for patient to present to urgent care if symptoms return. Patient vocalized understanding.  -No same day appointments available in the clinic today. Encouraged sooner follow up within a week with PCP -Encouraged home BP monitoring   Medication Management: - Currently strategy insufficient to maintain appropriate adherence to prescribed medication regimen - Suggested use of phone alarms and to take first thing in the morning to assist with adherence - Discussed collaboration with local pharmacies for adherence packaging. Reviewed local pharmacies with adherence packaging options. Patient elects to utilize [facility] community pharmacies adherence packaging service. Will coordinate with the pharmacy team at [facility].    Tobacco Abuse - Currently uncontrolled - Provided motivational interviewing to assess tobacco use and strategies for reduction - Restart nicotine  patch 21 mg daily. Counseled on proper placement and potential side effects, including mild itching/redness at the location site, headache, trouble sleeping and/or vivid dreams. Advised to remove patch at night if development of trouble sleeping.  - start nicotine  lozenge 4 mg as needed. Counseled on proper use and potential side effects, including nausea, hiccups, cough, and heartburn.  - Start varenicline  0.5 mg daily for 3 days, then 0.5 mg twice daily for 4 days, then 1 mg twice daily thereafter. Counseled tot ake with food to reduce  risk of GI adverse effects. Also counseled to stop therapy and contact provider if change in mood.     Patient verbalized understanding of treatment plan. Total time patient counseling 30 minutes.  Follow-up:  Pharmacist on 08/14/24 PCP clinic visit on 08/09/24  [name], PharmD Clinical Pharmacist [facility] [phone]

## 2024-07-10 NOTE — Patient Instructions (Addendum)
 Please contact [employer], the transportation provider for your insurance, at [phone]. This should help with transportation for your appointments.

## 2024-07-10 NOTE — Progress Notes (Signed)
 S:     Reason for visit: ?  Jason Livingston is a 58 y.o. male with a history of diabetes (type 2), who presents today for a follow up  Face to Face pharmacotherapy visit.? Pertinent PMH also includes asthma, T2DM, anxiety, depression, GERD, HLD, HTN, PAD, SVT, T2DM.  Care Team: Primary Care Provider: Donzella Lauraine SAILOR, DO  At last visit with cardiology on 05/20/24, he denied palpitations. EKG at that time showed NSR, incomplete right BBB, nonspecific Twave abnormality compared to previous.  At last visit with clinical pharmacist on 06/06/24, patient reported symptoms of palpitations and SOB that started yesterday evening and lasted for an hour in duration and resolved when he took a dose of his Lopressor  25 mg. Previous palpitations reported >1 year ago per patient. Denied symptoms at that time. Patient was started on Chantix  for smoking cessation and instructed to follow up with PCP, however, patient canceled appointment with his PCP.  Today, he denies any additional SVT symptoms. He had no yet been able to pick up his smoking cessation medications.   Current diabetes medications include: metformin  XR 500 mg 2 tablets daily Current hypertension medications include: losartan  100 mg daily, Toprol  XL 100 mg daily, Lopressor  25 mg prn palpitations Current hyperlipidemia medications include: rosuvastatin  40 mg daily   Patient reports difficulty with adherence, namely missing medications a few times per week.   Have you been experiencing any side effects to the medications prescribed? no Do you have any problems obtaining medications due to transportation or finances? yes Insurance coverage: [name] Medicaid  Tobacco Abuse:  Tobacco Use History: Age when started using tobacco on a daily basis: 19 Number of cigarettes per day: at least 1.5 ppd (rolls his own) Smokes first cigarette: 30-40 minutes after waking Does not wake at night to smoke Triggers include: boredom   Quit Attempt  History: Most recent quit attempt about 20 years ago Longest time ever been tobacco free 2.5 years Methods tried in the past include Nicotine  Patch, Nicotine  lozenges Motivators to quitting include concern for lung cancer on most recent CT scan  DM Prevention:  Statin: Taking; high intensity.?  History of albuminuria? no Last eye exam:  Lab Results  Component Value Date   HMDIABEYEEXA No Retinopathy 06/29/2023   Tobacco Use:  Tobacco Use: High Risk (05/20/2024)   Patient History    Smoking Tobacco Use: Every Day    Smokeless Tobacco Use: Former    Passive Exposure: Not on file   O:   Vitals:  Wt Readings from Last 3 Encounters:  05/20/24 224 lb 12.8 oz (102 kg)  05/10/24 226 lb 8 oz (102.7 kg)  02/09/24 225 lb 6.4 oz (102.2 kg)   BP Readings from Last 3 Encounters:  07/10/24 135/81  06/06/24 111/72  05/20/24 109/60   Pulse Readings from Last 3 Encounters:  07/10/24 65  06/06/24 64  05/20/24 68     Labs:?  Lab Results  Component Value Date   HGBA1C 6.3 (H) 05/10/2024   HGBA1C 6.3 (H) 11/10/2023   HGBA1C 6.5 (H) 05/10/2023   GLUCOSE 87 05/10/2024   MICRALBCREAT 4 05/10/2024   MICRALBCREAT <6 06/05/2023   CREATININE 1.04 05/10/2024   CREATININE 0.76 08/31/2023   CREATININE 0.77 08/30/2023    Lab Results  Component Value Date   CHOL 128 05/10/2024   LDLCALC 63 05/10/2024   LDLCALC 78 10/05/2023   LDLCALC 123 (H) 05/10/2023   LDLDIRECT 75 10/05/2023   HDL 26 (L) 05/10/2024   TRIG  236 (H) 05/10/2024   TRIG 183 (H) 10/05/2023   TRIG 280 (H) 05/10/2023   ALT 20 05/10/2024   ALT 15 05/10/2023   AST 18 05/10/2024   AST 17 05/10/2023      Chemistry      Component Value Date/Time   NA 140 05/10/2024 1516   K 4.9 05/10/2024 1516   CL 102 05/10/2024 1516   CO2 21 05/10/2024 1516   BUN 16 05/10/2024 1516   CREATININE 1.04 05/10/2024 1516      Component Value Date/Time   CALCIUM  9.6 05/10/2024 1516   ALKPHOS 66 05/10/2024 1516   AST 18 05/10/2024  1516   ALT 20 05/10/2024 1516   BILITOT 0.5 05/10/2024 1516       The ASCVD Risk score (Arnett DK, et al., 2019) failed to calculate for the following reasons:   The valid total cholesterol range is 130 to 320 mg/dL  Lab Results  Component Value Date   MICRALBCREAT 4 05/10/2024   MICRALBCREAT <6 06/05/2023    A/P:  Medication Management: - Currently strategy insufficient to maintain appropriate adherence to prescribed medication regimen - Suggested use of phone alarms and to take first thing in the morning to assist with adherence - Discussed collaboration with local pharmacies for adherence packaging. Reviewed local pharmacies with adherence packaging options. Patient elects to utilize [facility] community pharmacies adherence packaging service. Will coordinate with the pharmacy team at [facility].    Tobacco Abuse - Currently uncontrolled - Provided motivational interviewing to assess tobacco use and strategies for reduction - Restart nicotine  patch 21 mg daily. Counseled on proper placement and potential side effects, including mild itching/redness at the location site, headache, trouble sleeping and/or vivid dreams. Advised to remove patch at night if development of trouble sleeping.  - start nicotine  lozenge 4 mg as needed. Counseled on proper use and potential side effects, including nausea, hiccups, cough, and heartburn.  - Start varenicline  0.5 mg daily for 3 days, then 0.5 mg twice daily for 4 days, then 1 mg twice daily thereafter. Counseled tot ake with food to reduce risk of GI adverse effects. Also counseled to stop therapy and contact provider if change in mood.     Patient verbalized understanding of treatment plan. Total time patient counseling 30 minutes.  Follow-up:  Pharmacist on 08/14/24 PCP clinic visit on 08/09/24  [name], PharmD Clinical Pharmacist [facility] [phone]

## 2024-07-12 ENCOUNTER — Other Ambulatory Visit: Payer: Self-pay

## 2024-07-17 ENCOUNTER — Other Ambulatory Visit: Payer: Self-pay

## 2024-07-25 ENCOUNTER — Other Ambulatory Visit: Payer: Self-pay

## 2024-07-25 ENCOUNTER — Telehealth: Payer: Self-pay | Admitting: Pharmacy Technician

## 2024-07-25 NOTE — Progress Notes (Signed)
° °  07/25/2024 Name: Jason Livingston MRN: [id number] DOB: 11/09/1965  Patient is appearing for a follow-up visit with the population health pharmacy technician. Last engaged with the clinical pharmacist to discuss medication access and smoking cessation on 07/10/2024. Contacted pharmacy today to discuss medication access.   Plan from last clinical pharmacist appointment: A/P: Medication Management: - Currently strategy insufficient to maintain appropriate adherence to prescribed medication regimen - Suggested use of phone alarms and to take first thing in the morning to assist with adherence - Discussed collaboration with local pharmacies for adherence packaging. Reviewed local pharmacies with adherence packaging options. Patient elects to utilize [facility] community pharmacies adherence packaging service. Will coordinate with the pharmacy team at [facility]. Tobacco Abuse - Currently uncontrolled - Provided motivational interviewing to assess tobacco use and strategies for reduction - Restart nicotine  patch 21 mg daily. Counseled on proper placement and potential side effects, including mild itching/redness at the location site, headache, trouble sleeping and/or vivid dreams. Advised to remove patch at night if development of trouble sleeping.  - start nicotine  lozenge 4 mg as needed. Counseled on proper use and potential side effects, including nausea, hiccups, cough, and heartburn.  - Start varenicline  0.5 mg daily for 3 days, then 0.5 mg twice daily for 4 days, then 1 mg twice daily thereafter. Counseled tot ake with food to reduce risk of GI adverse effects. Also counseled to stop therapy and contact provider if change in mood.  -Patient verbalized understanding of treatment plan. Total time patient counseling 30 minutes.  -Follow-up:  Pharmacist on 08/14/24 PCP clinic visit on 08/09/24(copy/paste from last note)   Medication Adherence Barriers Identified:   Access issues with any new  medication or testing device: Yes Any maintenance medication that would go into medication adherence packs.   Medication Adherence Barriers Addressed/Actions Taken:  Reviewed medication changes per plan from last clinical pharmacist note Medication Access for any maintenance medication that would go into medication adherence packs Will discuss medication access concerns with pharmacist Contacted pharmacy regarding new prescriptions Per Sushama, I don't have this patient enrolled in packs. Is he a new patient? Informed Sushama that per embedded PharmD note on 07/10/24, PharmD  Discussed collaboration with local pharmacies for adherence packaging. Reviewed local pharmacies with adherence packaging options. Patient elects to utilize [facility] community pharmacies adherence packaging service. Will coordinate with the pharmacy team at [facility]. Genevia informs I'm sorry, I may have missed it.SABRASABRAI'm not sure.SABRASABRAI have been diligent about noting down the info for new patients but this isn't on my radar. If your pharmacist can send  request again,  I greatly appreciate that. Thank you. Will send to embedded PharmD to send the request again. Next clinical pharmacist appointment is scheduled for: 08/14/2024  Kate Caddy, CPhT [facility] Office: [phone] Email: [email or web address]

## 2024-07-26 ENCOUNTER — Other Ambulatory Visit: Payer: Self-pay

## 2024-07-29 ENCOUNTER — Telehealth: Payer: Self-pay

## 2024-07-29 DIAGNOSIS — G479 Sleep disorder, unspecified: Secondary | ICD-10-CM

## 2024-07-29 NOTE — Telephone Encounter (Signed)
 Per pill packaging pharmacist, patient will need a new prescription sent for trazodone  with daily instructions in order for this to be including in his pill packaging. Patient reports taking this daily.  Will request new prescription from PCP.  Yamilex Borgwardt E. Marsh, PharmD, CPP Clinical Pharmacist [facility] [phone]

## 2024-07-30 ENCOUNTER — Other Ambulatory Visit: Payer: Self-pay

## 2024-08-02 ENCOUNTER — Other Ambulatory Visit: Payer: Self-pay

## 2024-08-02 ENCOUNTER — Telehealth: Payer: Self-pay | Admitting: Pharmacy Technician

## 2024-08-02 NOTE — Progress Notes (Signed)
" ° °  08/02/2024 Name: Jason Livingston MRN: [id number] DOB: [birth year]  Patient is appearing for a follow-up visit with the population health pharmacy technician. Last engaged with the clinical pharmacist to discuss smoking cessation and mixed hyperlipidemia on 07/10/2024. Contacted patient today to discuss medication adherence and medication access.   Plan from last clinical pharmacist appointment:  Medication Management: - Currently strategy insufficient to maintain appropriate adherence to prescribed medication regimen - Suggested use of phone alarms and to take first thing in the morning to assist with adherence - Discussed collaboration with local pharmacies for adherence packaging. Reviewed local pharmacies with adherence packaging options. Patient elects to utilize [facility] community pharmacies adherence packaging service. Will coordinate with the pharmacy team at [facility].  Tobacco Abuse - Currently uncontrolled - Provided motivational interviewing to assess tobacco use and strategies for reduction - Restart nicotine  patch 21 mg daily. Counseled on proper placement and potential side effects, including mild itching/redness at the location site, headache, trouble sleeping and/or vivid dreams. Advised to remove patch at night if development of trouble sleeping.  - start nicotine  lozenge 4 mg as needed. Counseled on proper use and potential side effects, including nausea, hiccups, cough, and heartburn.  - Start varenicline  0.5 mg daily for 3 days, then 0.5 mg twice daily for 4 days, then 1 mg twice daily thereafter. Counseled tot ake with food to reduce risk of GI adverse effects. Also counseled to stop therapy and contact provider if change in mood.  -Patient verbalized understanding of treatment plan. Total time patient counseling 30 minutes. -Follow-up:  Pharmacist on 08/14/24 PCP clinic visit on 08/09/24(copy/paste from last note)   Medication Adherence Barriers Identified:  Access  issues with any new medication or testing device: No Per Dr Annemarie, the following medications were all filled in vials/bottles on 07/26/24 for a 30 day supply: Clopidogrel  75mg , Losartan  100mg , Metformin  XR 500mg , Metoprolol  Succinate 100mg , Omeprazole  20mg ,  Rosuvastatin  40mg . Trazodone  50mg  was also filled on 07/26/24 but order change from PRNhas NOT been reflected to a routine order per PharmD request on 12/15/225. PRN orders are not able to be placed in pill packs. Will send note to PharmD.  When speaking to patient this morning, patient confirms having routine daily maintenance medications on hand to take as prescribed.   Medication Adherence Barriers Addressed/Actions Taken:  Reviewed medication changes per plan from last clinical pharmacist note Medication Access for Routine daily maintenance medications as above. Will discuss medication access concerns with pharmacist Spoke to patient to inquire if he has received adherence pill packs. HIPAA verified. Patient informs he will not be starting the adherence packaging until sometime next month. Contacted pharmacy regarding new prescriptions Was informed the following by pharmacist, Dorn Dural, covering for pharmacist, Genevia Cork, in regard to patient's pill paks The fills from 12/12 were filled in rx bottles and NOT the adherence packaging. Best I can see from [employer], is a tentative start date of 1/26(?) but I'm presuming that may be moved forward if the most recent fill was 12/12. Unfortunately, would have to defer to her when she gets back for a more concrete date  Next clinical pharmacist appointment is scheduled for: 08/14/2024   Kate Caddy, CPhT [facility] Office: [phone] Email: [email or web address]  "

## 2024-08-05 ENCOUNTER — Other Ambulatory Visit: Payer: Self-pay

## 2024-08-05 MED ORDER — TRAZODONE HCL 50 MG PO TABS
50.0000 mg | ORAL_TABLET | Freq: Every day | ORAL | 3 refills | Status: DC
  Filled 2024-08-05: qty 90, 90d supply, fill #0
  Filled 2024-08-23: qty 30, 30d supply, fill #0
  Filled ????-??-??: fill #1

## 2024-08-07 ENCOUNTER — Other Ambulatory Visit

## 2024-08-07 DIAGNOSIS — F17200 Nicotine dependence, unspecified, uncomplicated: Secondary | ICD-10-CM

## 2024-08-07 DIAGNOSIS — Z716 Tobacco abuse counseling: Secondary | ICD-10-CM

## 2024-08-07 NOTE — Progress Notes (Signed)
 "  S:     Reason for visit: ?  Jason Livingston is a 58 y.o. male with a history of diabetes (type 2), who presents today for a follow up  Face to Face pharmacotherapy visit.? Pertinent PMH also includes asthma, T2DM, anxiety, depression, GERD, HLD, HTN, PAD, SVT, T2DM.  They were referred to the pharmacist by their PCP for assistance in managing complex medication management.   Care Team: Primary Care Provider: Donzella Lauraine SAILOR, DO  At last visit with clinical pharmacist on 07/10/24, patient reported he had not yet been able to start Chantix , but planned to pick it up from the pharmacy following the appointment. He reported issues with medication adherence at that time and requested to start using a pill packaging service.  In the interim, [place] adherence packaging team coordinate with [place] regarding a start date in 08/2024.   Today, he reports he set a tobacco quit date on 07/05/24. He reports he has had 1 cigarette per day maximum since that time. He previously would contact the quit line for support, but felt they would contact him too frequently.    Current diabetes medications include: metformin  XR 500 mg 2 tablets daily Current hypertension medications include: losartan  100 mg daily, Toprol  XL 100 mg daily, Lopressor  25 mg prn palpitations Current hyperlipidemia medications include: rosuvastatin  40 mg daily   Patient reports difficulty with adherence, namely missing medications a few times per week.   Have you been experiencing any side effects to the medications prescribed? no Do you have any problems obtaining medications due to transportation or finances? yes Insurance coverage: [place] Medicaid  Tobacco Abuse: Current medications: Nicotine  patch 21 mg/day, Chantix  0.5 mg BID, and Nicotine  lozenges 4 mg prn   Tobacco Use History: Age when started using tobacco on a daily basis: 19 Number of cigarettes per day: at least 1.5 ppd (rolls his own) - down to 1 per day Smokes  first cigarette: 30-40 minutes after waking Does not wake at night to smoke Triggers include: boredom   Quit Attempt History: Most recent quit attempt about 20 years ago Longest time ever been tobacco free 2.5 years Methods tried in the past include Nicotine  Patch, Nicotine  lozenges Motivators to quitting include concern for lung cancer on most recent CT scan  DM Prevention:  Statin: Taking; high intensity.?  History of albuminuria? no Last eye exam:  Lab Results  Component Value Date   HMDIABEYEEXA No Retinopathy 06/29/2023   Tobacco Use:  Tobacco Use: High Risk (05/20/2024)   Patient History    Smoking Tobacco Use: Every Day    Smokeless Tobacco Use: Former    Passive Exposure: Not on file   O:   Vitals:  Wt Readings from Last 3 Encounters:  05/20/24 224 lb 12.8 oz (102 kg)  05/10/24 226 lb 8 oz (102.7 kg)  02/09/24 225 lb 6.4 oz (102.2 kg)   BP Readings from Last 3 Encounters:  07/10/24 135/81  06/06/24 111/72  05/20/24 109/60   Pulse Readings from Last 3 Encounters:  07/10/24 65  06/06/24 64  05/20/24 68     Labs:?  Lab Results  Component Value Date   HGBA1C 6.3 (H) 05/10/2024   HGBA1C 6.3 (H) 11/10/2023   HGBA1C 6.5 (H) 05/10/2023   GLUCOSE 87 05/10/2024   MICRALBCREAT 4 05/10/2024   MICRALBCREAT <6 06/05/2023   CREATININE 1.04 05/10/2024   CREATININE 0.76 08/31/2023   CREATININE 0.77 08/30/2023    Lab Results  Component Value Date  CHOL 128 05/10/2024   LDLCALC 63 05/10/2024   LDLCALC 78 10/05/2023   LDLCALC 123 (H) 05/10/2023   LDLDIRECT 75 10/05/2023   HDL 26 (L) 05/10/2024   TRIG 236 (H) 05/10/2024   TRIG 183 (H) 10/05/2023   TRIG 280 (H) 05/10/2023   ALT 20 05/10/2024   ALT 15 05/10/2023   AST 18 05/10/2024   AST 17 05/10/2023      Chemistry      Component Value Date/Time   NA 140 05/10/2024 1516   K 4.9 05/10/2024 1516   CL 102 05/10/2024 1516   CO2 21 05/10/2024 1516   BUN 16 05/10/2024 1516   CREATININE 1.04 05/10/2024  1516      Component Value Date/Time   CALCIUM  9.6 05/10/2024 1516   ALKPHOS 66 05/10/2024 1516   AST 18 05/10/2024 1516   ALT 20 05/10/2024 1516   BILITOT 0.5 05/10/2024 1516       The ASCVD Risk score (Arnett DK, et al., 2019) failed to calculate for the following reasons:   The valid total cholesterol range is 130 to 320 mg/dL  Lab Results  Component Value Date   MICRALBCREAT 4 05/10/2024   MICRALBCREAT <6 06/05/2023    A/P:  Medication Management: - Currently strategy insufficient to maintain appropriate adherence to prescribed medication regimen - Suggested use of phone alarms and to take first thing in the morning to assist with adherence - Set to start adherence packaging with [place] community pharmacies in 08/2024   Tobacco Abuse - Quit date of 08/04/24. May sometimes have on cigarette per day. Denies any issues with current medication therapies, aside from some nausea with nicotine  lozenges that resolves after a few minutes.  - Provided motivational interviewing to assess tobacco use and strategies for reduction - Continue nicotine  patch 21 mg daily x 6 weeks. Counseled on proper placement and potential side effects, including mild itching/redness at the location site, headache, trouble sleeping and/or vivid dreams. Advised to remove patch at night if development of trouble sleeping.  - Continue nicotine  lozenge 4 mg as needed. Counseled on proper use and potential side effects, including nausea, hiccups, cough, and heartburn.  - Continue varenicline  0.5 mg twice daily for 4 more days, then 1 mg twice daily thereafter. Counseled to take with food to reduce risk of GI adverse effects. Also counseled to stop therapy and contact provider if change in mood.     Patient verbalized understanding of treatment plan. Total time patient counseling 30 minutes.  Follow-up:  Pharmacist on 08/29/24 PCP clinic visit on 08/09/24  [name], PharmD Clinical  Pharmacist [facility] [phone]  "

## 2024-08-09 ENCOUNTER — Ambulatory Visit: Admitting: Family Medicine

## 2024-08-12 ENCOUNTER — Ambulatory Visit: Admission: RE | Admit: 2024-08-12 | Source: Ambulatory Visit

## 2024-08-13 ENCOUNTER — Ambulatory Visit
Admission: RE | Admit: 2024-08-13 | Discharge: 2024-08-13 | Disposition: A | Discharge status: Home / Self Care | Source: Ambulatory Visit | Attending: Acute Care | Admitting: Acute Care

## 2024-08-13 DIAGNOSIS — R918 Other nonspecific abnormal finding of lung field: Secondary | ICD-10-CM | POA: Insufficient documentation

## 2024-08-14 ENCOUNTER — Other Ambulatory Visit

## 2024-08-23 ENCOUNTER — Telehealth: Payer: Self-pay | Admitting: Pharmacy Technician

## 2024-08-23 ENCOUNTER — Other Ambulatory Visit: Payer: Self-pay

## 2024-08-23 ENCOUNTER — Other Ambulatory Visit: Payer: Self-pay | Admitting: Acute Care

## 2024-08-23 DIAGNOSIS — Z87891 Personal history of nicotine dependence: Secondary | ICD-10-CM

## 2024-08-23 DIAGNOSIS — Z122 Encounter for screening for malignant neoplasm of respiratory organs: Secondary | ICD-10-CM

## 2024-08-23 DIAGNOSIS — F17210 Nicotine dependence, cigarettes, uncomplicated: Secondary | ICD-10-CM

## 2024-08-23 NOTE — Progress Notes (Signed)
" ° °  08/23/2024 Name: Jason Livingston MRN: [id number] DOB: 1966-03-03  Patient is appearing for a follow-up visit with the population health pharmacy technician. Last engaged with the clinical pharmacist to discuss smoking cessation on 08/07/2024. Contacted pharmacy today to discuss medication adherence and medication access.   Plan from last clinical pharmacist appointment:  Medication Management: - Currently strategy insufficient to maintain appropriate adherence to prescribed medication regimen - Suggested use of phone alarms and to take first thing in the morning to assist with adherence - Set to start adherence packaging with [place] community pharmacies in 08/2024 Tobacco Abuse - Quit date of 08/04/24. May sometimes have on cigarette per day. Denies any issues with current medication therapies, aside from some nausea with nicotine  lozenges that resolves after a few minutes.  - Provided motivational interviewing to assess tobacco use and strategies for reduction - Continue nicotine  patch 21 mg daily x 6 weeks. Counseled on proper placement and potential side effects, including mild itching/redness at the location site, headache, trouble sleeping and/or vivid dreams. Advised to remove patch at night if development of trouble sleeping.  - Continue nicotine  lozenge 4 mg as needed. Counseled on proper use and potential side effects, including nausea, hiccups, cough, and heartburn.  - Continue varenicline  0.5 mg twice daily for 4 more days, then 1 mg twice daily thereafter. Counseled to take with food to reduce risk of GI adverse effects. Also counseled to stop therapy and contact provider if change in mood.  -Patient verbalized understanding of treatment plan. Total time patient counseling 30 minutes. -Follow-up:  Pharmacist on 08/29/24 PCP clinic visit on 12/26/2(copy/paste from last note)   Medication Adherence Barriers Identified:  Access issues with any new medication or testing device: Yes  Patient is stating adherence packaging for his routine maintenance medications.  Medication Adherence Barriers Addressed/Actions Taken:  Reviewed medication changes per plan from last clinical pharmacist note Medication Access for adherence packaging Reached out to Sushama Tate with adherence packaging to confirm patient's start date of adherence packs and to inquire about a potential delivery date. Genevia informs patient is in her que to start assembling the adherence packs. She informs the packs should go out next week for delivery to the patient. She informs patient start date for the packs will be 08/31/2024.   Next clinical pharmacist appointment is scheduled for: 08/29/2024  Itzael Liptak, CPhT [facility] Office: [phone] Email: [email or web address]   "

## 2024-08-29 ENCOUNTER — Other Ambulatory Visit: Payer: Self-pay

## 2024-08-29 ENCOUNTER — Other Ambulatory Visit

## 2024-08-29 DIAGNOSIS — F17200 Nicotine dependence, unspecified, uncomplicated: Secondary | ICD-10-CM

## 2024-08-29 DIAGNOSIS — E1169 Type 2 diabetes mellitus with other specified complication: Secondary | ICD-10-CM

## 2024-08-29 NOTE — Progress Notes (Signed)
 "   S:     Reason for visit: ?  Jason Livingston is a 59 y.o. male with a history of diabetes (type 2), who presents today for a follow up  Face to Face pharmacotherapy visit.? Pertinent PMH also includes asthma, anxiety, depression, GERD, HLD, HTN, PAD, SVT, T2DM.   They were referred to the pharmacist by their PCP for assistance in managing complex medication management.  Care Team: Primary Care Provider: Donzella Lauraine SAILOR, DO  At last visit, discussed pill packaging with patient to help with goals of adherence. Pt also identified a smoking cessation quit date of 08/04/24. Reported some nausea with nicotine  lozenges that resolves after a few minutes.  Today, he also reports he has not been smoking any cigarettes! He reports he is having poor sleep with Chantix  and cannot get back on a regular sleep schedule.   He reports he has received his pill packages and will start them on Saturday 08/31/24 which includes all medication except for aspirin  81 mg.  Current diabetes medications include: metformin  XR 500 mg 2 tablets daily Current hypertension medications include: losartan  100 mg daily, Toprol  XL 100 mg daily, Lopressor  25 mg prn palpitations Current hyperlipidemia medications include: rosuvastatin  40 mg daily   Patient reports difficulty with adherence, namely missing medications a few times per week.   Have you been experiencing any side effects to the medications prescribed? Yes Chantix : Sleep disturbances Nicotine  lozenges: nausea Do you have any problems obtaining medications due to transportation or finances? yes Insurance coverage: [place] [employer]  Tobacco Abuse: Current medications: Nicotine  patch 21 mg/day, Nicotine  lozenges 4 mg prn Previous medications: Chantix  (stopped due to sleep disturbances)  Tobacco Use History: Age when started using tobacco on a daily basis 19 Number of cigarettes per day: previously 1.5 ppd (rolls his own)  - no longer smoking Does not wake at night  to smoke Triggers include: boredom  Quit Attempt History: Most recent quit attempt about 20 years ago Longest time ever been tobacco free 2.5 years Methods tried in the past include Nicotine  Patch, Nicotine  lozenges Motivators to quitting include concern for lung cancer on most recent CT scan  DM Prevention:  Statin: Taking; high intensity - rosuvastatin  40 mg daily  ACE/ARB: yes; losartan  100 mg daily History of chronic kidney disease? no Last urinary albumin/creatinine ratio:  Lab Results  Component Value Date   MICRALBCREAT 4 05/10/2024   MICRALBCREAT <6 06/05/2023   Last eye exam:  Lab Results  Component Value Date   HMDIABEYEEXA No Retinopathy 06/29/2023   Lab Results  Component Value Date   HMDIABEYEEXA No Retinopathy 06/29/2023   Last foot exam: 06/05/2023 Tobacco Use:  Tobacco Use: High Risk (05/20/2024)   Patient History    Smoking Tobacco Use: Every Day    Smokeless Tobacco Use: Former    Passive Exposure: Not on file   O:   Vitals:  Wt Readings from Last 3 Encounters:  05/20/24 102 kg (224 lb 12.8 oz)  05/10/24 102.7 kg (226 lb 8 oz)  02/09/24 102.2 kg (225 lb 6.4 oz)   BP Readings from Last 3 Encounters:  07/10/24 135/81  06/06/24 111/72  05/20/24 109/60   Pulse Readings from Last 3 Encounters:  07/10/24 65  06/06/24 64  05/20/24 68     Labs:?  Lab Results  Component Value Date   HGBA1C 6.3 (H) 05/10/2024   HGBA1C 6.3 (H) 11/10/2023   HGBA1C 6.5 (H) 05/10/2023   GLUCOSE 87 05/10/2024   MICRALBCREAT 4  05/10/2024   MICRALBCREAT <6 06/05/2023   CREATININE 1.04 05/10/2024   CREATININE 0.76 08/31/2023   CREATININE 0.77 08/30/2023    Lab Results  Component Value Date   CHOL 128 05/10/2024   LDLCALC 63 05/10/2024   LDLCALC 78 10/05/2023   LDLCALC 123 (H) 05/10/2023   LDLDIRECT 75 10/05/2023   HDL 26 (L) 05/10/2024   TRIG 236 (H) 05/10/2024   TRIG 183 (H) 10/05/2023   TRIG 280 (H) 05/10/2023   ALT 20 05/10/2024   ALT 15 05/10/2023    AST 18 05/10/2024   AST 17 05/10/2023      Chemistry      Component Value Date/Time   NA 140 05/10/2024 1516   K 4.9 05/10/2024 1516   CL 102 05/10/2024 1516   CO2 21 05/10/2024 1516   BUN 16 05/10/2024 1516   CREATININE 1.04 05/10/2024 1516      Component Value Date/Time   CALCIUM  9.6 05/10/2024 1516   ALKPHOS 66 05/10/2024 1516   AST 18 05/10/2024 1516   ALT 20 05/10/2024 1516   BILITOT 0.5 05/10/2024 1516       The ASCVD Risk score (Arnett DK, et al., 2019) failed to calculate for the following reasons:   The valid total cholesterol range is 130 to 320 mg/dL  Lab Results  Component Value Date   MICRALBCREAT 4 05/10/2024   MICRALBCREAT <6 06/05/2023    A/P:  Medication Management: - Currently, strategy seems to be sufficient to maintain appropriate adherence to prescribed medication regimen - Pt utilizing phone alarms first thing in the morning to assist with adhere, and reports this has helped -Adherence packaging established with [facility]. Pt received pill packages on 08/27/24 and will start using it on 08/31/24.   - Add aspirin  81 mg to adherence packaging - will contact [facility]   Tobacco Abuse - Reports no longer having any cigarettes. Cannot remember date of last cigarette - Reports occasional urges to smoke especially when bored - Reports sleep disturbances with Chantix  and that he has stopped (last dose on Day 13 of starter pack) - Denies issues with nicotine  patches or lozenges, aside from some nausea with nicotine  lozenges that resolves after a few minutes - Provided motivational interviewing to assess tobacco use and strategies for reduction - Continue nicotine  patch 21 mg daily x 6 weeks. Counseled on proper placement and potential side effects, including mild itching/redness at the location site, headache, trouble sleeping and/or vivid dreams. Advised to remove patch at night if development of trouble  sleeping. - Continue nicotine  lozenge 4 mg as needed. Counseled on proper use and potential side effects, including nausea, hiccups, cough, and heartburn.  - Stop Chantix      Patient verbalized understanding of treatment plan. Total time patient counseling 30 minutes.  Follow-up:  Pharmacist on 10/31/24 PCP clinic visit on 09/06/24  Prentice DOROTHA Favors, PharmD PGY1 Health-System Pharmacy Administration and Leadership Resident [facility]  08/29/2024 3:09 PM  "

## 2024-09-06 ENCOUNTER — Encounter: Payer: Self-pay | Admitting: Family Medicine

## 2024-09-06 ENCOUNTER — Ambulatory Visit (INDEPENDENT_AMBULATORY_CARE_PROVIDER_SITE_OTHER): Admitting: Family Medicine

## 2024-09-06 ENCOUNTER — Other Ambulatory Visit: Payer: Self-pay

## 2024-09-06 VITALS — BP 121/79 | HR 75 | Temp 97.4°F | Ht 73.0 in | Wt 233.9 lb

## 2024-09-06 DIAGNOSIS — E1159 Type 2 diabetes mellitus with other circulatory complications: Secondary | ICD-10-CM

## 2024-09-06 DIAGNOSIS — J432 Centrilobular emphysema: Secondary | ICD-10-CM

## 2024-09-06 DIAGNOSIS — F17211 Nicotine dependence, cigarettes, in remission: Secondary | ICD-10-CM

## 2024-09-06 DIAGNOSIS — I152 Hypertension secondary to endocrine disorders: Secondary | ICD-10-CM

## 2024-09-06 DIAGNOSIS — E1169 Type 2 diabetes mellitus with other specified complication: Secondary | ICD-10-CM | POA: Diagnosis not present

## 2024-09-06 DIAGNOSIS — Z7984 Long term (current) use of oral hypoglycemic drugs: Secondary | ICD-10-CM

## 2024-09-06 DIAGNOSIS — F19982 Other psychoactive substance use, unspecified with psychoactive substance-induced sleep disorder: Secondary | ICD-10-CM

## 2024-09-06 DIAGNOSIS — I70229 Atherosclerosis of native arteries of extremities with rest pain, unspecified extremity: Secondary | ICD-10-CM

## 2024-09-06 DIAGNOSIS — Z23 Encounter for immunization: Secondary | ICD-10-CM

## 2024-09-06 DIAGNOSIS — G479 Sleep disorder, unspecified: Secondary | ICD-10-CM

## 2024-09-06 DIAGNOSIS — I471 Supraventricular tachycardia, unspecified: Secondary | ICD-10-CM | POA: Diagnosis not present

## 2024-09-06 LAB — POCT GLYCOSYLATED HEMOGLOBIN (HGB A1C): Hemoglobin A1C: 6.2 % — AB (ref 4.0–5.6)

## 2024-09-06 MED ORDER — TRAZODONE HCL 50 MG PO TABS
100.0000 mg | ORAL_TABLET | Freq: Every day | ORAL | 1 refills | Status: AC
  Filled 2024-09-06: qty 180, 90d supply, fill #0
  Filled 2024-09-20: qty 60, 30d supply, fill #0
  Filled ????-??-??: fill #0

## 2024-09-06 NOTE — Assessment & Plan Note (Addendum)
 Chronic, stable. A1c 6.2 today, indicated very good control. Currently managed with metformin -XR 1000 mg daily.  Reduced sensation in feet, occasional calf cramping. No ulcers or significant foot issues. Improved sensation post-surgery. - Continue metformin -XR 1000 mg daily.

## 2024-09-06 NOTE — Assessment & Plan Note (Addendum)
 Chronic, stable on losartan  100 mg daily, and metoprolol  succinate 100 mg daily. No acute concerns.  Continue to monitor.  No changes today.

## 2024-09-06 NOTE — Assessment & Plan Note (Addendum)
 Chantix  not tolerated due to insomnia. He has not smoked since starting Chantix , using nicotine  patches and lozenges. Strong cravings in certain situations, no concern for relapse. - Continue nicotine  patches, transition from 21 mg to 14 mg. - Use nicotine  lozenges as needed for cravings. - Consider bupropion (Wellbutrin) as an alternative to Chantix , if needed.

## 2024-09-06 NOTE — Assessment & Plan Note (Addendum)
 Currently managed with rosuvastatin  40 mg daily, clopidogrel  75 mg daily, and aspirin  81 mg daily.  Patient due for follow-up with vascular surgery but reports having difficulty scheduling appointment.  Sent message to vascular surgery to request scheduling.  Patient to continue to try to contact vascular surgery office to schedule follow-up for reevaluation and possible rescheduling of planned procedure.  Continue to monitor.  Defer to specialist management.

## 2024-09-06 NOTE — Progress Notes (Signed)
 "     Established patient visit   Patient: Jason Livingston   DOB: 10-Nov-1965   59 y.o. Male  MRN: [id number] Visit Date: 09/06/2024  Today's healthcare provider: LAURAINE LOISE BUOY, DO   Chief Complaint  Patient presents with   Medical Management of Chronic Issues    Patient is here today for a 3 month follow up on chronic management.  Diabetic Eye Exam- [facility] requested records  Wanting to talk about his sleep, chantix  has disrupted his sleep schedule.  Been about 2 weeks since taking it.  Hasn't smoked a cigarette in about a week now.   Subjective    HPI Jason Livingston is a 59 year old male with insomnia and nicotine  dependence who presents with sleep disturbances and smoking cessation challenges.  He has experienced significant sleep disturbances since starting Chantix  for smoking cessation. Initially, he took Chantix  for 11 days but discontinued due to severe insomnia, getting only 2 hours of sleep per night. He describes feeling 'brain dead' and unable to function due to lack of sleep. Trazodone  was initially effective at half a tablet for sleep, but it is now ineffective even at a full 50 mg tablet since starting Chantix . He experiences a dry throat upon waking, which he attributes to trazodone .  He is currently using nicotine  patches at 21 mg and plans to reduce to 14 mg. He also uses nicotine  lozenges to manage cravings, especially when triggered by environmental cues such as bus rides past familiar smoking areas. He reports no cigarette use since starting Chantix  and nicotine  replacement therapy.  He experiences cramping in his calf, particularly the left calf, which can last for days and impacts his mobility. He has had difficulty scheduling a follow-up appointment with his vascular surgeon due to communication issues with the scheduler.  He has a history of diabetes, managed with metformin -XR 1000 mg daily, and reports not checking his blood sugars recently. He also takes  Crestor  for cholesterol, metoprolol  for heart issues, and losartan  for blood pressure. He mentions receiving a box of medications but has not yet organized them.      Medications: Show/hide medication list[1]       Objective    BP 121/79 (BP Location: Left Arm, Patient Position: Sitting, Cuff Size: Large)   Pulse 75   Temp (!) 97.4 F (36.3 C) (Oral)   Ht 6' 1 (1.854 m)   Wt 233 lb 14.4 oz (106.1 kg)   SpO2 96%   BMI 30.86 kg/m     Physical Exam Vitals and nursing note reviewed.  Constitutional:      General: He is not in acute distress.    Appearance: Normal appearance.  HENT:     Head: Normocephalic and atraumatic.  Eyes:     General: No scleral icterus.    Conjunctiva/sclera: Conjunctivae normal.  Cardiovascular:     Rate and Rhythm: Normal rate.  Pulmonary:     Effort: Pulmonary effort is normal.  Neurological:     Mental Status: He is alert and oriented to person, place, and time. Mental status is at baseline.  Psychiatric:        Mood and Affect: Mood normal.        Behavior: Behavior normal.      Results for orders placed or performed in visit on 09/06/24  POCT HgB A1C  Result Value Ref Range   Hemoglobin A1C 6.2 (A) 4.0 - 5.6 %   HbA1c POC (<> result, manual entry)  HbA1c, POC (prediabetic range)     HbA1c, POC (controlled diabetic range)      Assessment & Plan    Type 2 diabetes mellitus with other specified complication, without long-term current use of insulin  (HCC) Assessment & Plan: Chronic, stable. A1c 6.2 today, indicated very good control. Currently managed with metformin -XR 1000 mg daily.  Reduced sensation in feet, occasional calf cramping. No ulcers or significant foot issues. Improved sensation post-surgery. - Continue metformin -XR 1000 mg daily.  Orders: -     [facility] Diabetes Foot Exam -     POCT glycosylated hemoglobin (Hb A1C)  Hypertension associated with diabetes (HCC) Assessment & Plan: Chronic, stable on losartan  100 mg  daily, and metoprolol  succinate 100 mg daily. No acute concerns.  Continue to monitor.  No changes today.   Centrilobular emphysema (HCC) Assessment & Plan: Chronic, currently asymptomatic.  No acute concerns.  Continue to monitor.    Atherosclerosis of artery of extremity with rest pain [facility]) Assessment & Plan: Currently managed with rosuvastatin  40 mg daily, clopidogrel  75 mg daily, and aspirin  81 mg daily.  Patient due for follow-up with vascular surgery but reports having difficulty scheduling appointment.  Sent message to vascular surgery to request scheduling.  Patient to continue to try to contact vascular surgery office to schedule follow-up for reevaluation and possible rescheduling of planned procedure.  Continue to monitor.  Defer to specialist management.   Nicotine  dependence, cigarettes, in remission Assessment & Plan: Chantix  not tolerated due to insomnia. He has not smoked since starting Chantix , using nicotine  patches and lozenges. Strong cravings in certain situations, no concern for relapse. - Continue nicotine  patches, transition from 21 mg to 14 mg. - Use nicotine  lozenges as needed for cravings. - Consider bupropion (Wellbutrin) as an alternative to Chantix , if needed.   SVT (supraventricular tachycardia) Assessment & Plan: Noted. On metoprolol  succinate 100 mg daily. Follows with cardiology; defer to specialist management.   Drug-induced insomnia (HCC)  Sleep disturbance -     traZODone  HCl; Take 2 tablets (100 mg total) by mouth at bedtime.  Dispense: 180 tablet; Refill: 1  Need for vaccination against Streptococcus pneumoniae      Drug-induced insomnia; sleep disturbance Insomnia likely due to Chantix . Trazodone  50 mg previously effective, now ineffective. Melatonin ineffective. - Increase trazodone  to 100 mg, option to take 1.5 tablets instead of 2 if needed. - Consider quetiapine (Seroquel) if trazodone  ineffective at higher doses.  Need for  vaccination against Streptococcus pneumoniae No record of previous pneumococcal vaccination.  Vaccine is currently out of stock.. - Administer pneumococcal vaccine when available or patient may receive at pharmacy.    Return in about 5 weeks (around 10/14/2024) for sleep recheck, and 5/1 or later for [facility] w/next provider.      I discussed the assessment and treatment plan with the patient  The patient was provided an opportunity to ask questions and all were answered. The patient agreed with the plan and demonstrated an understanding of the instructions.   The patient was advised to call back or seek an in-person evaluation if the symptoms worsen or if the condition fails to improve as anticipated.    LAURAINE LOISE BUOY, DO  [facility] [phone] (phone) [phone] (fax)   Medical Group     [1]  Outpatient Medications Prior to Visit  Medication Sig   Accu-Chek Softclix Lancets lancets Use 1 each daily before breakfast.   acetaminophen  (TYLENOL ) 500 MG tablet Take 500-1,000 mg by mouth every  6 (six) hours as needed (pain.).   albuterol  (VENTOLIN  HFA) 108 (90 Base) MCG/ACT inhaler Inhale 2 puffs into the lungs every 6 (six) hours as needed for wheezing or shortness of breath.   aspirin  EC 81 MG tablet Take 1 tablet (81 mg total) by mouth daily. Swallow whole.   Blood Glucose Monitoring Suppl (BLOOD GLUCOSE MONITOR SYSTEM) w/Device KIT Use as directed   Blood Pressure Monitoring (BLOOD PRESSURE KIT) KIT Use to check blood pressure daily as directed. Dx code I10.0   clopidogrel  (PLAVIX ) 75 MG tablet Take 1 tablet (75 mg total) by mouth daily at 6 (six) AM.   fluticasone  furoate-vilanterol (BREO ELLIPTA ) 200-25 MCG/ACT AEPB Inhale 1 puff into the lungs once daily.   Glucose Blood (BLOOD GLUCOSE TEST STRIPS) STRP Use daily before breakfast   Lancet Device MISC 1 each by Does not apply route daily before breakfast. May substitute to any manufacturer  covered by patient's insurance.   losartan  (COZAAR ) 100 MG tablet Take 1 tablet (100 mg total) by mouth daily.   metFORMIN  (GLUCOPHAGE -XR) 500 MG 24 hr tablet Take 2 tablets (1,000 mg total) by mouth once daily with breakfast.   metoprolol  succinate (TOPROL  XL) 100 MG 24 hr tablet Take 1 tablet (100 mg total) by mouth once daily.   metoprolol  tartrate (LOPRESSOR ) 25 MG tablet Take 1 tablet (25 mg total) by mouth once daily as needed. (For elevated heart rates or SVT).   nicotine  (NICODERM CQ  - DOSED IN MG/24 HOURS) 14 mg/24hr patch RX #2 Weeks 5-6: 14 mg x 1 patch daily. Wear for 24 hours. If you have sleep disturbances, remove at bedtime.   nicotine  (NICODERM CQ  - DOSED IN MG/24 HOURS) 21 mg/24hr patch Place 1 patch (21 mg total) onto the skin daily.   nicotine  (NICODERM CQ  - DOSED IN MG/24 HR) 7 mg/24hr patch RX #3 Weeks 7-8: 7 mg x 1 patch daily. Wear for 24 hours. If you have sleep disturbances, remove at bedtime.   nicotine  polacrilex (COMMIT) 4 MG lozenge Take 4 mg by mouth as needed for smoking cessation.   omeprazole  (PRILOSEC) 20 MG capsule Take 1 capsule (20 mg total) by mouth once daily.   rosuvastatin  (CRESTOR ) 40 MG tablet Take 1 tablet (40 mg total) by mouth daily.   [DISCONTINUED] traZODone  (DESYREL ) 50 MG tablet Take 1 tablet (50 mg total) by mouth at bedtime.   No facility-administered medications prior to visit.   "

## 2024-09-06 NOTE — Assessment & Plan Note (Signed)
 Chronic, currently asymptomatic.  No acute concerns.  Continue to monitor.

## 2024-09-06 NOTE — Assessment & Plan Note (Signed)
 Noted. On metoprolol  succinate 100 mg daily. Follows with cardiology; defer to specialist management.

## 2024-09-10 ENCOUNTER — Other Ambulatory Visit: Payer: Self-pay

## 2024-09-20 ENCOUNTER — Other Ambulatory Visit (HOSPITAL_BASED_OUTPATIENT_CLINIC_OR_DEPARTMENT_OTHER): Payer: Self-pay

## 2024-09-20 ENCOUNTER — Other Ambulatory Visit: Payer: Self-pay

## 2024-09-20 ENCOUNTER — Other Ambulatory Visit (HOSPITAL_COMMUNITY): Payer: Self-pay

## 2024-10-15 ENCOUNTER — Ambulatory Visit: Admitting: Family Medicine

## 2024-10-31 ENCOUNTER — Other Ambulatory Visit

## 2024-11-14 ENCOUNTER — Ambulatory Visit: Admitting: Medical

## 2025-01-22 ENCOUNTER — Encounter: Admitting: Family Medicine
